# Patient Record
Sex: Female | Born: 1985 | Race: White | Hispanic: No | Marital: Single | Smoking: Never smoker
Health system: Southern US, Community
[De-identification: ages and names within clinical notes are randomized; demographics above are authoritative.]

## PROBLEM LIST (undated history)

## (undated) DIAGNOSIS — J45909 Unspecified asthma, uncomplicated: Secondary | ICD-10-CM

## (undated) DIAGNOSIS — F79 Unspecified intellectual disabilities: Secondary | ICD-10-CM

## (undated) DIAGNOSIS — G809 Cerebral palsy, unspecified: Secondary | ICD-10-CM

## (undated) DIAGNOSIS — M779 Enthesopathy, unspecified: Secondary | ICD-10-CM

## (undated) DIAGNOSIS — Z66 Do not resuscitate: Secondary | ICD-10-CM

## (undated) DIAGNOSIS — Z789 Other specified health status: Secondary | ICD-10-CM

## (undated) DIAGNOSIS — N390 Urinary tract infection, site not specified: Secondary | ICD-10-CM

## (undated) DIAGNOSIS — G40909 Epilepsy, unspecified, not intractable, without status epilepticus: Secondary | ICD-10-CM

## (undated) DIAGNOSIS — S7290XA Unspecified fracture of unspecified femur, initial encounter for closed fracture: Secondary | ICD-10-CM

## (undated) DIAGNOSIS — G919 Hydrocephalus, unspecified: Secondary | ICD-10-CM

## (undated) DIAGNOSIS — M419 Scoliosis, unspecified: Secondary | ICD-10-CM

## (undated) DIAGNOSIS — M8580 Other specified disorders of bone density and structure, unspecified site: Secondary | ICD-10-CM

## (undated) HISTORY — PX: TYMPANOSTOMY TUBE PLACEMENT: SHX32

## (undated) HISTORY — DX: Scoliosis, unspecified: M41.9

## (undated) HISTORY — DX: Unspecified fracture of unspecified femur, initial encounter for closed fracture: S72.90XA

## (undated) HISTORY — DX: Other specified health status: Z78.9

## (undated) HISTORY — PX: GASTROSTOMY TUBE PLACEMENT: SHX655

## (undated) HISTORY — DX: Do not resuscitate: Z66

## (undated) HISTORY — DX: Other specified disorders of bone density and structure, unspecified site: M85.80

## (undated) HISTORY — DX: Cerebral palsy, unspecified: G80.9

## (undated) HISTORY — PX: LAPAROSCOPIC NISSEN FUNDOPLICATION: SHX1932

## (undated) HISTORY — DX: Epilepsy, unspecified, not intractable, without status epilepticus: G40.909

## (undated) HISTORY — DX: Enthesopathy, unspecified: M77.9

## (undated) HISTORY — DX: Unspecified intellectual disabilities: F79

## (undated) HISTORY — DX: Unspecified asthma, uncomplicated: J45.909

## (undated) HISTORY — DX: Urinary tract infection, site not specified: N39.0

## (undated) HISTORY — PX: ILEOPSOAS RELEASE: SHX1782

## (undated) HISTORY — PX: OTHER SURGICAL HISTORY: SHX169

## (undated) HISTORY — DX: Hydrocephalus, unspecified: G91.9

---
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
## 2007-10-27 ENCOUNTER — Ambulatory Visit (HOSPITAL_COMMUNITY): Admission: RE | Admit: 2007-10-27 | Discharge: 2007-10-27 | Payer: Self-pay | Admitting: Family Medicine

## 2008-05-05 ENCOUNTER — Inpatient Hospital Stay (HOSPITAL_COMMUNITY): Admission: EM | Admit: 2008-05-05 | Discharge: 2008-05-11 | Payer: Self-pay | Admitting: Emergency Medicine

## 2008-05-10 ENCOUNTER — Ambulatory Visit: Payer: Self-pay | Admitting: Internal Medicine

## 2008-12-13 ENCOUNTER — Inpatient Hospital Stay (HOSPITAL_COMMUNITY): Admission: AD | Admit: 2008-12-13 | Discharge: 2008-12-18 | Payer: Self-pay | Admitting: Pulmonary Disease

## 2008-12-13 ENCOUNTER — Ambulatory Visit: Payer: Self-pay | Admitting: Internal Medicine

## 2008-12-22 ENCOUNTER — Encounter: Payer: Self-pay | Admitting: Pulmonary Disease

## 2009-02-22 ENCOUNTER — Encounter: Payer: Self-pay | Admitting: Pulmonary Disease

## 2009-04-30 ENCOUNTER — Encounter: Payer: Self-pay | Admitting: Pulmonary Disease

## 2009-06-06 ENCOUNTER — Encounter: Payer: Self-pay | Admitting: Pulmonary Disease

## 2009-06-15 ENCOUNTER — Ambulatory Visit (HOSPITAL_COMMUNITY): Admission: RE | Admit: 2009-06-15 | Discharge: 2009-06-15 | Payer: Self-pay | Admitting: Pediatrics

## 2009-09-28 ENCOUNTER — Encounter: Payer: Self-pay | Admitting: Pulmonary Disease

## 2009-10-14 DIAGNOSIS — G40909 Epilepsy, unspecified, not intractable, without status epilepticus: Secondary | ICD-10-CM | POA: Insufficient documentation

## 2009-10-14 DIAGNOSIS — J4530 Mild persistent asthma, uncomplicated: Secondary | ICD-10-CM

## 2009-10-14 DIAGNOSIS — J189 Pneumonia, unspecified organism: Secondary | ICD-10-CM

## 2009-10-14 DIAGNOSIS — G809 Cerebral palsy, unspecified: Secondary | ICD-10-CM

## 2009-10-17 ENCOUNTER — Telehealth (INDEPENDENT_AMBULATORY_CARE_PROVIDER_SITE_OTHER): Payer: Self-pay | Admitting: *Deleted

## 2009-10-17 ENCOUNTER — Ambulatory Visit: Payer: Self-pay | Admitting: Pulmonary Disease

## 2009-10-17 DIAGNOSIS — R0902 Hypoxemia: Secondary | ICD-10-CM

## 2009-10-17 DIAGNOSIS — R0602 Shortness of breath: Secondary | ICD-10-CM | POA: Insufficient documentation

## 2009-10-21 ENCOUNTER — Telehealth (INDEPENDENT_AMBULATORY_CARE_PROVIDER_SITE_OTHER): Payer: Self-pay | Admitting: *Deleted

## 2009-11-21 ENCOUNTER — Ambulatory Visit: Payer: Self-pay | Admitting: Pulmonary Disease

## 2009-12-22 ENCOUNTER — Telehealth (INDEPENDENT_AMBULATORY_CARE_PROVIDER_SITE_OTHER): Payer: Self-pay | Admitting: *Deleted

## 2010-01-25 ENCOUNTER — Ambulatory Visit: Payer: Self-pay | Admitting: Pulmonary Disease

## 2010-05-26 ENCOUNTER — Encounter: Admission: RE | Admit: 2010-05-26 | Discharge: 2010-05-26 | Payer: Self-pay | Admitting: Pediatrics

## 2010-07-29 ENCOUNTER — Inpatient Hospital Stay (HOSPITAL_COMMUNITY): Admission: EM | Admit: 2010-07-29 | Discharge: 2010-08-01 | Payer: Self-pay | Source: Home / Self Care

## 2010-08-02 ENCOUNTER — Inpatient Hospital Stay (HOSPITAL_COMMUNITY)
Admission: EM | Admit: 2010-08-02 | Discharge: 2010-08-10 | Payer: Self-pay | Source: Home / Self Care | Attending: Internal Medicine | Admitting: Internal Medicine

## 2010-08-07 ENCOUNTER — Telehealth: Payer: Self-pay | Admitting: Pulmonary Disease

## 2010-08-07 LAB — CULTURE, BLOOD (ROUTINE X 2)
Culture: NO GROWTH
Culture: NO GROWTH

## 2010-08-07 LAB — URINE CULTURE
Colony Count: NO GROWTH
Culture  Setup Time: 201201082212
Culture: NO GROWTH

## 2010-08-07 LAB — BASIC METABOLIC PANEL
BUN: 4 mg/dL — ABNORMAL LOW (ref 6–23)
BUN: 4 mg/dL — ABNORMAL LOW (ref 6–23)
BUN: 6 mg/dL (ref 6–23)
BUN: 2 mg/dL — ABNORMAL LOW (ref 6–23)
BUN: <1 mg/dL — ABNORMAL LOW (ref 6–23)
BUN: <1 mg/dL — ABNORMAL LOW (ref 6–23)
CO2: 25 mEq/L (ref 19–32)
CO2: 27 mEq/L (ref 19–32)
CO2: 20 mEq/L (ref 19–32)
CO2: 25 mEq/L (ref 19–32)
CO2: 27 mEq/L (ref 19–32)
CO2: 27 mEq/L (ref 19–32)
Calcium: 8.3 mg/dL — ABNORMAL LOW (ref 8.4–10.5)
Calcium: 8.7 mg/dL (ref 8.4–10.5)
Calcium: 8.5 mg/dL (ref 8.4–10.5)
Calcium: 7.8 mg/dL — ABNORMAL LOW (ref 8.4–10.5)
Calcium: 8.4 mg/dL (ref 8.4–10.5)
Calcium: 8.2 mg/dL — ABNORMAL LOW (ref 8.4–10.5)
Chloride: 101 mEq/L (ref 96–112)
Chloride: 102 mEq/L (ref 96–112)
Chloride: 105 mEq/L (ref 96–112)
Chloride: 104 mEq/L (ref 96–112)
Chloride: 102 mEq/L (ref 96–112)
Chloride: 106 mEq/L (ref 96–112)
Creatinine, Ser: <0.3 mg/dL — ABNORMAL LOW (ref 0.4–1.2)
Creatinine, Ser: 0.3 mg/dL — ABNORMAL LOW (ref 0.4–1.2)
Creatinine, Ser: 0.41 mg/dL (ref 0.4–1.2)
Creatinine, Ser: <0.3 mg/dL — ABNORMAL LOW (ref 0.4–1.2)
Creatinine, Ser: <0.3 mg/dL — ABNORMAL LOW (ref 0.4–1.2)
Creatinine, Ser: <0.3 mg/dL — ABNORMAL LOW (ref 0.4–1.2)
GFR calc Af Amer: >60 mL/min (ref >60)
GFR calc Af Amer: >60 mL/min (ref >60)
GFR calc non Af Amer: >60 mL/min (ref >60)
GFR calc non Af Amer: >60 mL/min (ref >60)
Glucose, Bld: 81 mg/dL (ref 70–99)
Glucose, Bld: 56 mg/dL — ABNORMAL LOW (ref 70–99)
Glucose, Bld: 42 mg/dL — CL (ref 70–99)
Glucose, Bld: 137 mg/dL — ABNORMAL HIGH (ref 70–99)
Glucose, Bld: 77 mg/dL (ref 70–99)
Glucose, Bld: 95 mg/dL (ref 70–99)
Potassium: 3.1 mEq/L — ABNORMAL LOW (ref 3.5–5.1)
Potassium: 4.4 mEq/L (ref 3.5–5.1)
Potassium: 3.7 mEq/L (ref 3.5–5.1)
Potassium: 2.6 mEq/L — CL (ref 3.5–5.1)
Potassium: 3.9 mEq/L (ref 3.5–5.1)
Potassium: 3.5 mEq/L (ref 3.5–5.1)
Sodium: 136 mEq/L (ref 135–145)
Sodium: 136 mEq/L (ref 135–145)
Sodium: 138 mEq/L (ref 135–145)
Sodium: 135 mEq/L (ref 135–145)
Sodium: 136 mEq/L (ref 135–145)
Sodium: 138 mEq/L (ref 135–145)

## 2010-08-07 LAB — CBC
HCT: 40.3 % (ref 36.0–46.0)
HCT: 37.6 % (ref 36.0–46.0)
HCT: 36.4 % (ref 36.0–46.0)
HCT: 41.5 % (ref 36.0–46.0)
HCT: 39 % (ref 36.0–46.0)
HCT: 37.1 % (ref 36.0–46.0)
Hemoglobin: 14 g/dL (ref 12.0–15.0)
Hemoglobin: 13.3 g/dL (ref 12.0–15.0)
Hemoglobin: 12.6 g/dL (ref 12.0–15.0)
Hemoglobin: 14.1 g/dL (ref 12.0–15.0)
Hemoglobin: 12.9 g/dL (ref 12.0–15.0)
Hemoglobin: 12.1 g/dL (ref 12.0–15.0)
MCH: 35.1 pg — ABNORMAL HIGH (ref 26.0–34.0)
MCH: 35.5 pg — ABNORMAL HIGH (ref 26.0–34.0)
MCH: 34.8 pg — ABNORMAL HIGH (ref 26.0–34.0)
MCH: 34.8 pg — ABNORMAL HIGH (ref 26.0–34.0)
MCH: 34.1 pg — ABNORMAL HIGH (ref 26.0–34.0)
MCH: 34 pg (ref 26.0–34.0)
MCHC: 34.7 g/dL (ref 30.0–36.0)
MCHC: 35.4 g/dL (ref 30.0–36.0)
MCHC: 34.6 g/dL (ref 30.0–36.0)
MCHC: 34 g/dL (ref 30.0–36.0)
MCHC: 33.1 g/dL (ref 30.0–36.0)
MCHC: 32.6 g/dL (ref 30.0–36.0)
MCV: 101 fL — ABNORMAL HIGH (ref 78.0–100.0)
MCV: 100.3 fL — ABNORMAL HIGH (ref 78.0–100.0)
MCV: 100.6 fL — ABNORMAL HIGH (ref 78.0–100.0)
MCV: 102.5 fL — ABNORMAL HIGH (ref 78.0–100.0)
MCV: 103.2 fL — ABNORMAL HIGH (ref 78.0–100.0)
MCV: 104.2 fL — ABNORMAL HIGH (ref 78.0–100.0)
Platelets: 188 10*3/uL (ref 150–400)
Platelets: 181 10*3/uL (ref 150–400)
Platelets: 206 10*3/uL (ref 150–400)
Platelets: 260 10*3/uL (ref 150–400)
Platelets: 285 10*3/uL (ref 150–400)
Platelets: 291 10*3/uL (ref 150–400)
RBC: 3.99 MIL/uL (ref 3.87–5.11)
RBC: 3.75 MIL/uL — ABNORMAL LOW (ref 3.87–5.11)
RBC: 3.62 MIL/uL — ABNORMAL LOW (ref 3.87–5.11)
RBC: 4.05 MIL/uL (ref 3.87–5.11)
RBC: 3.78 MIL/uL — ABNORMAL LOW (ref 3.87–5.11)
RBC: 3.56 MIL/uL — ABNORMAL LOW (ref 3.87–5.11)
RDW: 13.3 % (ref 11.5–15.5)
RDW: 13.1 % (ref 11.5–15.5)
RDW: 13.3 % (ref 11.5–15.5)
RDW: 13.3 % (ref 11.5–15.5)
RDW: 13.3 % (ref 11.5–15.5)
RDW: 13.3 % (ref 11.5–15.5)
WBC: 10.3 10*3/uL (ref 4.0–10.5)
WBC: 8.9 10*3/uL (ref 4.0–10.5)
WBC: 5.6 10*3/uL (ref 4.0–10.5)
WBC: 9.2 10*3/uL (ref 4.0–10.5)
WBC: 6.6 10*3/uL (ref 4.0–10.5)
WBC: 5.6 10*3/uL (ref 4.0–10.5)

## 2010-08-07 LAB — DIFFERENTIAL
Basophils Absolute: 0 10*3/uL (ref 0.0–0.1)
Basophils Absolute: 0 10*3/uL (ref 0.0–0.1)
Basophils Absolute: 0 10*3/uL (ref 0.0–0.1)
Basophils Absolute: 0 10*3/uL (ref 0.0–0.1)
Basophils Absolute: 0 10*3/uL (ref 0.0–0.1)
Basophils Relative: 0 % (ref 0–1)
Basophils Relative: 0 % (ref 0–1)
Basophils Relative: 0 % (ref 0–1)
Basophils Relative: 0 % (ref 0–1)
Basophils Relative: 1 % (ref 0–1)
Eosinophils Absolute: 0.2 10*3/uL (ref 0.0–0.7)
Eosinophils Absolute: 0.1 10*3/uL (ref 0.0–0.7)
Eosinophils Absolute: 0.4 10*3/uL (ref 0.0–0.7)
Eosinophils Absolute: 0.8 10*3/uL — ABNORMAL HIGH (ref 0.0–0.7)
Eosinophils Absolute: 0.6 10*3/uL (ref 0.0–0.7)
Eosinophils Relative: 2 % (ref 0–5)
Eosinophils Relative: 1 % (ref 0–5)
Eosinophils Relative: 8 % — ABNORMAL HIGH (ref 0–5)
Eosinophils Relative: 9 % — ABNORMAL HIGH (ref 0–5)
Eosinophils Relative: 9 % — ABNORMAL HIGH (ref 0–5)
Lymphocytes Relative: 8 % — ABNORMAL LOW (ref 12–46)
Lymphocytes Relative: 9 % — ABNORMAL LOW (ref 12–46)
Lymphocytes Relative: 22 % (ref 12–46)
Lymphocytes Relative: 13 % (ref 12–46)
Lymphocytes Relative: 22 % (ref 12–46)
Lymphs Abs: 0.8 10*3/uL (ref 0.7–4.0)
Lymphs Abs: 0.8 10*3/uL (ref 0.7–4.0)
Lymphs Abs: 1.2 10*3/uL (ref 0.7–4.0)
Lymphs Abs: 1.2 10*3/uL (ref 0.7–4.0)
Lymphs Abs: 1.4 10*3/uL (ref 0.7–4.0)
Monocytes Absolute: 0.7 10*3/uL (ref 0.1–1.0)
Monocytes Absolute: 0.6 10*3/uL (ref 0.1–1.0)
Monocytes Absolute: 0.6 10*3/uL (ref 0.1–1.0)
Monocytes Absolute: 0.4 10*3/uL (ref 0.1–1.0)
Monocytes Absolute: 0.5 10*3/uL (ref 0.1–1.0)
Monocytes Relative: 7 % (ref 3–12)
Monocytes Relative: 7 % (ref 3–12)
Monocytes Relative: 10 % (ref 3–12)
Monocytes Relative: 4 % (ref 3–12)
Monocytes Relative: 7 % (ref 3–12)
Neutro Abs: 8.5 10*3/uL — ABNORMAL HIGH (ref 1.7–7.7)
Neutro Abs: 7.4 10*3/uL (ref 1.7–7.7)
Neutro Abs: 3.4 10*3/uL (ref 1.7–7.7)
Neutro Abs: 6.7 10*3/uL (ref 1.7–7.7)
Neutro Abs: 4.1 10*3/uL (ref 1.7–7.7)
Neutrophils Relative %: 83 % — ABNORMAL HIGH (ref 43–77)
Neutrophils Relative %: 83 % — ABNORMAL HIGH (ref 43–77)
Neutrophils Relative %: 60 % (ref 43–77)
Neutrophils Relative %: 74 % (ref 43–77)
Neutrophils Relative %: 62 % (ref 43–77)

## 2010-08-07 LAB — URINALYSIS, ROUTINE W REFLEX MICROSCOPIC
Bilirubin Urine: NEGATIVE
Bilirubin Urine: NEGATIVE
Bilirubin Urine: NEGATIVE
Hgb urine dipstick: NEGATIVE
Hgb urine dipstick: NEGATIVE
Ketones, ur: NEGATIVE mg/dL
Ketones, ur: NEGATIVE mg/dL
Ketones, ur: NEGATIVE mg/dL
Nitrite: NEGATIVE
Nitrite: NEGATIVE
Nitrite: NEGATIVE
Protein, ur: NEGATIVE mg/dL
Protein, ur: NEGATIVE mg/dL
Protein, ur: NEGATIVE mg/dL
Specific Gravity, Urine: 1.01 (ref 1.005–1.030)
Specific Gravity, Urine: 1.005 (ref 1.005–1.030)
Specific Gravity, Urine: 1.011 (ref 1.005–1.030)
Urine Glucose, Fasting: NEGATIVE mg/dL
Urine Glucose, Fasting: NEGATIVE mg/dL
Urine Glucose, Fasting: NEGATIVE mg/dL
Urobilinogen, UA: 0.2 mg/dL (ref 0.0–1.0)
Urobilinogen, UA: 0.2 mg/dL (ref 0.0–1.0)
Urobilinogen, UA: 0.2 mg/dL (ref 0.0–1.0)
pH: 5.5 (ref 5.0–8.0)
pH: 6.5 (ref 5.0–8.0)
pH: 7.5 (ref 5.0–8.0)

## 2010-08-07 LAB — COMPREHENSIVE METABOLIC PANEL
ALT: 35 U/L (ref 0–35)
ALT: 29 U/L (ref 0–35)
ALT: 30 U/L (ref 0–35)
AST: 33 U/L (ref 0–37)
AST: 30 U/L (ref 0–37)
AST: 36 U/L (ref 0–37)
Albumin: 3.6 g/dL (ref 3.5–5.2)
Albumin: 3.2 g/dL — ABNORMAL LOW (ref 3.5–5.2)
Albumin: 3.7 g/dL (ref 3.5–5.2)
Alkaline Phosphatase: 113 U/L (ref 39–117)
Alkaline Phosphatase: 97 U/L (ref 39–117)
Alkaline Phosphatase: 99 U/L (ref 39–117)
BUN: 5 mg/dL — ABNORMAL LOW (ref 6–23)
BUN: 2 mg/dL — ABNORMAL LOW (ref 6–23)
BUN: <1 mg/dL — ABNORMAL LOW (ref 6–23)
CO2: 30 mEq/L (ref 19–32)
CO2: 29 mEq/L (ref 19–32)
CO2: 26 mEq/L (ref 19–32)
Calcium: 8.4 mg/dL (ref 8.4–10.5)
Calcium: 8 mg/dL — ABNORMAL LOW (ref 8.4–10.5)
Calcium: 9 mg/dL (ref 8.4–10.5)
Chloride: 95 mEq/L — ABNORMAL LOW (ref 96–112)
Chloride: 99 mEq/L (ref 96–112)
Chloride: 100 mEq/L (ref 96–112)
Creatinine, Ser: <0.3 mg/dL — ABNORMAL LOW (ref 0.4–1.2)
Creatinine, Ser: 0.25 mg/dL — ABNORMAL LOW (ref 0.4–1.2)
Creatinine, Ser: <0.3 mg/dL — ABNORMAL LOW (ref 0.4–1.2)
GFR calc Af Amer: >60 mL/min (ref >60)
GFR calc non Af Amer: >60 mL/min (ref >60)
Glucose, Bld: 101 mg/dL — ABNORMAL HIGH (ref 70–99)
Glucose, Bld: 100 mg/dL — ABNORMAL HIGH (ref 70–99)
Glucose, Bld: 83 mg/dL (ref 70–99)
Potassium: 2.8 mEq/L — ABNORMAL LOW (ref 3.5–5.1)
Potassium: 3.5 mEq/L (ref 3.5–5.1)
Potassium: 4.2 mEq/L (ref 3.5–5.1)
Sodium: 134 mEq/L — ABNORMAL LOW (ref 135–145)
Sodium: 136 mEq/L (ref 135–145)
Sodium: 136 mEq/L (ref 135–145)
Total Bilirubin: 0.3 mg/dL (ref 0.3–1.2)
Total Bilirubin: 0.4 mg/dL (ref 0.3–1.2)
Total Bilirubin: 0.4 mg/dL (ref 0.3–1.2)
Total Protein: 6.5 g/dL (ref 6.0–8.3)
Total Protein: 6.1 g/dL (ref 6.0–8.3)
Total Protein: 6.8 g/dL (ref 6.0–8.3)

## 2010-08-07 LAB — PHENYTOIN LEVEL, TOTAL: Phenytoin Lvl: 2.6 ug/mL — ABNORMAL LOW (ref 10.0–20.0)

## 2010-08-07 LAB — GLUCOSE, CAPILLARY
Glucose-Capillary: 302 mg/dL — ABNORMAL HIGH (ref 70–99)
Glucose-Capillary: 43 mg/dL — CL (ref 70–99)
Glucose-Capillary: 79 mg/dL (ref 70–99)
Glucose-Capillary: 101 mg/dL — ABNORMAL HIGH (ref 70–99)
Glucose-Capillary: 127 mg/dL — ABNORMAL HIGH (ref 70–99)
Glucose-Capillary: 98 mg/dL (ref 70–99)
Glucose-Capillary: 124 mg/dL — ABNORMAL HIGH (ref 70–99)
Glucose-Capillary: 108 mg/dL — ABNORMAL HIGH (ref 70–99)

## 2010-08-07 LAB — BRAIN NATRIURETIC PEPTIDE: Pro B Natriuretic peptide (BNP): <30 pg/mL (ref 0.0–100.0)

## 2010-08-07 LAB — LACTIC ACID, PLASMA: Lactic Acid, Venous: 1.3 mmol/L (ref 0.5–2.2)

## 2010-08-07 LAB — LIPASE, BLOOD
Lipase: 18 U/L (ref 11–59)
Lipase: 15 U/L (ref 11–59)

## 2010-08-07 LAB — OCCULT BLOOD, POC DEVICE: Fecal Occult Bld: NEGATIVE

## 2010-08-07 LAB — PHOSPHORUS: Phosphorus: 3.7 mg/dL (ref 2.3–4.6)

## 2010-08-07 LAB — MAGNESIUM
Magnesium: 1.8 mg/dL (ref 1.5–2.5)
Magnesium: 2.2 mg/dL (ref 1.5–2.5)
Magnesium: 2.2 mg/dL (ref 1.5–2.5)

## 2010-08-07 LAB — URINE MICROSCOPIC-ADD ON

## 2010-08-07 LAB — CLOSTRIDIUM DIFFICILE BY PCR: Toxigenic C. Difficile by PCR: NEGATIVE

## 2010-08-09 LAB — BASIC METABOLIC PANEL
BUN: <1 mg/dL — ABNORMAL LOW (ref 6–23)
BUN: 4 mg/dL — ABNORMAL LOW (ref 6–23)
CO2: 29 mEq/L (ref 19–32)
CO2: 30 mEq/L (ref 19–32)
Calcium: 8.4 mg/dL (ref 8.4–10.5)
Calcium: 8.3 mg/dL — ABNORMAL LOW (ref 8.4–10.5)
Chloride: 103 mEq/L (ref 96–112)
Chloride: 99 mEq/L (ref 96–112)
Creatinine, Ser: <0.3 mg/dL — ABNORMAL LOW (ref 0.4–1.2)
Creatinine, Ser: <0.3 mg/dL — ABNORMAL LOW (ref 0.4–1.2)
Glucose, Bld: 93 mg/dL (ref 70–99)
Glucose, Bld: 101 mg/dL — ABNORMAL HIGH (ref 70–99)
Potassium: 3.6 mEq/L (ref 3.5–5.1)
Potassium: 2.9 mEq/L — ABNORMAL LOW (ref 3.5–5.1)
Sodium: 136 mEq/L (ref 135–145)
Sodium: 138 mEq/L (ref 135–145)

## 2010-08-09 LAB — URINE CULTURE
Colony Count: NO GROWTH
Culture  Setup Time: 201201152108
Culture: NO GROWTH

## 2010-08-09 LAB — GLUCOSE, CAPILLARY
Glucose-Capillary: 78 mg/dL (ref 70–99)
Glucose-Capillary: 79 mg/dL (ref 70–99)
Glucose-Capillary: 81 mg/dL (ref 70–99)
Glucose-Capillary: 83 mg/dL (ref 70–99)
Glucose-Capillary: 93 mg/dL (ref 70–99)
Glucose-Capillary: 99 mg/dL (ref 70–99)
Glucose-Capillary: 143 mg/dL — ABNORMAL HIGH (ref 70–99)
Glucose-Capillary: 160 mg/dL — ABNORMAL HIGH (ref 70–99)
Glucose-Capillary: 165 mg/dL — ABNORMAL HIGH (ref 70–99)
Glucose-Capillary: 171 mg/dL — ABNORMAL HIGH (ref 70–99)
Glucose-Capillary: 181 mg/dL — ABNORMAL HIGH (ref 70–99)

## 2010-08-13 ENCOUNTER — Encounter: Payer: Self-pay | Admitting: Pediatrics

## 2010-08-14 ENCOUNTER — Ambulatory Visit
Admission: RE | Admit: 2010-08-14 | Discharge: 2010-08-14 | Payer: Self-pay | Source: Home / Self Care | Attending: Pulmonary Disease | Admitting: Pulmonary Disease

## 2010-08-14 LAB — BASIC METABOLIC PANEL
BUN: 8 mg/dL (ref 6–23)
CO2: 29 mEq/L (ref 19–32)
CO2: 30 mEq/L (ref 19–32)
Calcium: 9 mg/dL (ref 8.4–10.5)
Chloride: 104 mEq/L (ref 96–112)
Creatinine, Ser: <0.3 mg/dL — ABNORMAL LOW (ref 0.4–1.2)
Glucose, Bld: 86 mg/dL (ref 70–99)
Glucose, Bld: 83 mg/dL (ref 70–99)
Potassium: 4.2 mEq/L (ref 3.5–5.1)
Sodium: 138 mEq/L (ref 135–145)
Sodium: 140 mEq/L (ref 135–145)

## 2010-08-14 LAB — GLUCOSE, CAPILLARY
Glucose-Capillary: 75 mg/dL (ref 70–99)
Glucose-Capillary: 90 mg/dL (ref 70–99)
Glucose-Capillary: 130 mg/dL — ABNORMAL HIGH (ref 70–99)
Glucose-Capillary: 119 mg/dL — ABNORMAL HIGH (ref 70–99)
Glucose-Capillary: 153 mg/dL — ABNORMAL HIGH (ref 70–99)

## 2010-08-14 LAB — CBC
HCT: 35.1 % — ABNORMAL LOW (ref 36.0–46.0)
Hemoglobin: 11.3 g/dL — ABNORMAL LOW (ref 12.0–15.0)
Hemoglobin: 11.8 g/dL — ABNORMAL LOW (ref 12.0–15.0)
MCH: 33.5 pg (ref 26.0–34.0)
MCHC: 32.2 g/dL (ref 30.0–36.0)
MCV: 103.8 fL — ABNORMAL HIGH (ref 78.0–100.0)
WBC: 8.6 10*3/uL (ref 4.0–10.5)

## 2010-08-14 NOTE — Discharge Summary (Addendum)
  NAMEAURIA, Renee [name]                  ACCOUNT NO.:  [id number]  MEDICAL RECORD NO.:  [id number]          PATIENT TYPE:  INP  LOCATION:  3009                         FACILITY:  [facility]  PHYSICIAN:  Tarry Kos, MD       DATE OF BIRTH:  1986/06/02  DATE OF ADMISSION:  08/02/2010 DATE OF DISCHARGE:  08/10/2010                              DISCHARGE SUMMARY   DISCHARGE DIAGNOSES: 1. Ogilvie syndrome. 2. Hypokalemia. 3. Cerebral palsy. 4. History of chronic constipation. 5. Aspiration pneumonia. 6. History of seizure disorder. 7. Severely debilitating state. 8. History of hydrocephalus.  HOSPITAL COURSE:  Ms. Urich is a 25 year old female with severe disability secondary to cerebral palsy who presented to the emergency room with abdominal pain and unable to tolerate her tube feeds on August 03, 2010.  Please see admit H and P for full details and also interim discharge summary on August 08, 2010.  Ever since August 08, 2010, her tube feeds have been reinstituted and she has been tolerating dose for several days now.  Her potassium levels have been replaced. Her pseudo-obstruction has resolved.  She has been treated for aspiration pneumonia.  I am going to discharge her to complete a course of clindamycin.  Her white count is normal.  Hemoglobin is stable at 11.8.  Her potassium level today is 4.3.  Yesterday, it was 4.2.  Her blood cultures have been negative.  Urine cultures have also been negative.  PHYSICAL EXAMINATION:  VITAL SIGNS:  She has been afebrile.  Her vital signs have been stable. GENERAL:  She has been arousable with voice, no apparent distress, chronically debilitated state with severe mental retardation. CARDIAC:  Regular rate and rhythm without murmurs, rubs, or gallops. CHEST:  Clear to auscultation bilaterally.  No wheezes, rhonchi, or rales. ABDOMEN:  Soft, nontender, nondistended.  Positive bowel sounds.  No hepatosplenomegaly. EXTREMITIES:  No clubbing  or cyanosis.  She has got contracted extremities. SKIN:  No rashes. PSYCH:  No significant agitation.  She is being discharged home in the care of her mother.  She has got 24- hour care also through her insurance and home health arranged at home. At her followup appointment, I would recommend repeating a potassium level and magnesium level.  DISCHARGE MEDICATIONS: 1. MiraLax 17 g b.i.d. 2. Clindamycin 300 mg 3 times a day per tube for the next 7 days. 3. Jevity tube feeds as prior to admission. 4. Acetylcysteine 3 mL q.4 h. as needed. 5. Albuterol nebulizer 4 hours as needed for shortness of breath. 6. Children's ibuprofen as needed. 7. Primidone 50 mg 4 tablets at 7 a.m. and 11 p.m. and 3 tablets at 3     p.m. 8. Baclofen 10 mg one-half tablets at 7 a.m. and 3 p.m. and 2 tablets     at 11 p.m.          ______________________________ Tarry Kos, MD     RD/MEDQ  D:  08/10/2010  T:  08/11/2010  Job:  [id number]  Electronically Signed by Eldridge Dace MD on 08/14/2010 01:56:56 PM

## 2010-08-15 LAB — CULTURE, BLOOD (ROUTINE X 2)
Culture  Setup Time: 201201160854
Culture: NO GROWTH

## 2010-08-17 NOTE — Progress Notes (Addendum)
Renee [name], Renee [name]                  ACCOUNT NO.:  [id number]  MEDICAL RECORD NO.:  [id number]          PATIENT TYPE:  INP  LOCATION:  3009                         FACILITY:  [facility]  PHYSICIAN:  Kela Millin, M.D.DATE OF BIRTH:  03-23-1986                                PROGRESS NOTE   CURRENT DIAGNOSES: 1. Pseudo-obstruction/probable Ogilvie's /ileus, improved. 2. Chronic constipation. 3. Hypokalemia. 4. Probable aspiration pneumonia. 5. Cerebral palsy with spasticity 6. Seizure disorder. 7. History of hydrocephalus. 8. Contractures of the lower extremities. 9. Status post recent discharge from [facility] for treatment     of pneumonia.  PROCEDURES AND STUDIES: 1. Chest x-ray on January 11:  Further improvement in bilateral     perihilar opacities.  Minimal persistent opacities. 2. CT scan of abdomen and pelvis on January 11, technically suboptimal     study due to severe scoliosis and artifact from posterior spinal     fixation rods.  Diffuse colonic ileus versus pseudo-obstruction.     Dilated urinary bladder with multiple small bladder calculi.  Small     bowel gas in the urinary bladder may be due to recent     catheterization.  Central left lower lobe infiltrates, pneumonia     cannot be ruled out.  Small right pleural effusion. 3. Followup chest x-ray on 01/14:  Resolution of bilateral perihilar     pneumonia.  No acute cardiopulmonary disease. Abdominal x-rays on 01/15: Feeding tube in place in the gastric body, several gas-filled loops of large and small bowel suggesting ileus. 1. Chest x-ray on 01/16:  Worsening aeration.  Increasing bilateral     pulmonary opacities. 2. Followup chest x-ray on 01/17:  Patchy pulmonary infiltrative     densities are present within the right mid and lower lung areas.     There is slight increase in the density and extent of infiltrates     compared to previous study.  There is slight haziness in the lower     left mid  lung area without consolidation.  There is a small right     pleural effusion which appears to increase compared to previous     study.  Other findings are chronic and stable.  CONSULTATIONS: 1. [facility], Dr. Donell Beers. 2. Gastroenterology, Dr. Marina Goodell.  BRIEF HISTORY: The patient is a 25 year old white female with the above-listed medical problems who presented with abdominal pain.  The history was obtained from her mother at the bedside.  It was noted that the patient had just been discharged January 10 from [facility] following hospitalization for pneumonia.  Her mother reported that she was doing okay until the evening of admission when she received tube feeding via the G-tube.  Her abdomen became distended and her mother reported that she sat up.  Her mother also stated that the patient had mild abdominal distention upon discharge but she was instructed to monitor it and follow up with her primary care physician if the patient had any signs of discomfort.  Her mother reported that on the morning of admission abdomen was more distended and the patient was  uncomfortable to palpation.  It was reported that the patient had one diarrheal stool and it was described as brown and watery.  She was seen in the ED and a CT scan of the abdomen was done which showed colonic ileus versus pseudoobstruction.  She was admitted for further evaluation and management.  HOSPITAL COURSE:  PROBLEM #1: Colonic pseudo-obstruction/ileus. Upon admission the patient was kept n.p.o.  No feedings through the PEG tube.  She was hydrated with IV fluids.  General Surgery was consulted and they followed the patient. The impression was that she had a chronic ileus versus pseudoobstruction which would be common in someone with recent medical illness on top of her chronic illness especially neurogenic one.  Dr. Donell Beers recommended to minimize narcotics and muscle relaxants.  He baclofen was  held initially.  Her G-tube was placed to gravity.  The patient was having bowel movements and the abdominal distention improved.  Surgery followed and signed off.  The patient was started on Pedialyte and she tolerated this well and Jevity tube feedings were reinitiated. But following this, she had increased residuals and so the tube feedings were again stopped. Abdominal x-rays were done on 01/15 and the results are stated above showing gas field loops of large and small bowel suggesting ileus. General Surgery signed off and recommended GI consultation.  Dr. Audley Hose saw the patient initially and recommended an NG tube if the mother consented, but she did not consent to this.  GI followed and the patient was placed on Reglan and her bolus tube feedings were again restarted as well as MiraLAX.  The patient has been tolerating her tube feedings without residuals but she has not had any bowel movements for the second day now.  Her MiraLAX has been increased to b.i.d.  Follow and further manage accordingly. 1. Hypokalemia.  Her potassium was replaced in the hospital. 2. Probable aspiration pneumonia.  The patient had been treated for     pneumonia at [facility] just prior to admission here.  She     was maintained on Avelox during this hospital stay.  She has had     problems with clearing her secretions with episodes of shortness of     breath and coughing and chest x-ray done today is more consistent     with pneumonia, possibly recurrent aspiration pneumonia.     Clindamycin was added to Avelox today.  She has also been on     supplemental oxygen and nebulized bronchodilators as well as     suctioning as needed. 3. Seizure disorder.  The patient initially was placed on Dilantin     when she was n.p.o. Subsequently when she was able to tolerate tube     feedings.  She was started back on primidone and the Dilantin     discontinued. 4. Cerebral palsy with spasticity.  The patient's  baclofen was put on     hold secondary to number one, but the patient has been having     increased spasticity and so her baclofen was restarted at her     mother's request today. 5. The rounding hospitalist to follow and dictate the rest of this     hospital stay as well as her final discharge medications.     Kela Millin, M.D.     ACV/MEDQ  D:  08/08/2010  T:  08/08/2010  Job:  [id number]  Electronically Signed by Donnalee Curry M.D. on 08/17/2010 09:38:03 AM

## 2010-08-18 NOTE — Consult Note (Signed)
Renee [name], Renee [name]                  ACCOUNT NO.:  [id number]  MEDICAL RECORD NO.:  [id number]          PATIENT TYPE:  INP  LOCATION:  3009                         FACILITY:  [facility]  PHYSICIAN:  Jordan Hawks. Elnoria Howard, MD    DATE OF BIRTH:  [birth year]  DATE OF CONSULTATION:  08/06/2010 DATE OF DISCHARGE:                                CONSULTATION   PRIMARY CARE PROVIDER:  Corrie Mckusick, MD  This is unassigned triad hospitalist patient.  Consultation performed for [facility].  HISTORY OF PRESENT ILLNESS:  This is a 25 year old female with a past medical history of cerebral palsy and seizure disorder who is admitted to the hospital with complaints of abdominal pain and distention.  The patient was recently hospitalized for 3 days at [facility] for pneumonia.  At that time, the patient appeared to be in stable condition from the GI standpoint.  However, towards the end of the hospitalization, there were some reports by the mother that the patient was sitting up and there was some mild abdominal distention.  Typically, her abdomen is not distended and her iliac crest can be visualized. However, her symptoms continued to worsen and there is discomfort noted by the mother and she is subsequently admitted for further evaluation and treatment.  She was found and KUB revealed that she had a markedly distended gastric lumen and also some distention of her colonic intestine.  There was no evidence of any obstruction could be identified.  It is felt that the patient may have an ileus.  The patient is completely bed-bound as a result of her cerebral palsy and is not verbally communicative.  During the time that she was initially hospitalized, there were also reports that she had issues with diarrheal stool and this continued to persist.  However, she has not had any significant bowel movement for the past week, meaning no formed bowel movement.  She has had some watery-type stools and  today there was some evidence of mucus.  Surgery was consulted to evaluate the patient, however, there was no surgical indication.  Subsequently, GI consultation was requested.  PAST MEDICAL HISTORY AND PAST SURGICAL HISTORY:  As stated above with the addition of spinal surgery, rod implantation, bilateral hip dysplasia surgery, and ventriculoperitoneal shunt.  FAMILY HISTORY:  Noncontributory.  SOCIAL HISTORY:  The patient lives with her mother.  No alcohol, tobacco, or illicit drug use.  MEDICATIONS: 1. Albuterol 0.6 mg inhaler q.6 h. 2. Moxifloxacin 400 mg IV daily. 3. MiraLax 17 g p.o. daily. 4. Primidone 150 mg p.o. daily. 5. [name] 0.5 mg p.o. daily. 6. Tylenol 650 mg p.r.n. q.4 h. p.r.n. 7. Haldol 0.5 mg IV b.i.d. p.r.n. 8. Zofran 4 mg IV q.6 h. p.r.n.  ALLERGIES:  PENICILLIN.  PHYSICAL EXAMINATION:  VITAL SIGNS:  Blood pressure is 90/60.  Heart rate is 115.  Respirations are 16.  Temperature is 100.8. GENERAL:  The patient does not appear to be in acute distress, but I am truly unable to discern her actual baseline.  She has issues with some upper airway congestion.  She is having some difficulty  managing her oral secretions. HEENT:  Normocephalic and atraumatic.  Extraocular muscles appear to be intact. NECK:  Supple.  No lymphadenopathy. LUNGS:  Clear to auscultation bilaterally on the anterior examination. ABDOMEN:  Distended, soft, tympanic, some bowel sounds. EXTREMITIES:  Markedly atrophied and contractured.  LABORATORY VALUES:  Sodium is 138, potassium 3.5, chloride 106, CO2 of 27, glucose 95, BUN less than 1, and creatinine 0.3.  IMPRESSION: 1. Abdominal distention secondary to ileus. 2. Cerebral palsy. 3. Upper airway congestion and difficulty managing secretions.  At this time, I had hope that we would be able to express some air from the PEG tube.  Unfortunately, I was not able to manually decompress the abdomen.  Even with suction, only 60 mL  of some fluid and some liquids was able to be aspirated, but beyond that no further aspiration was possible.  She was found to have significant hypokalemia at 2.6 several days ago and this can be a compounding factor for her ileus.  There is no clear evidence of any obstruction at this time.  Unfortunately, the patient has very limited options.  Since an adapter cannot be placed on her current PEG tube for low intermittent suction, the next best alternative is an NG tube.  I discussed this in detail and mother has concern that this NG tube could exacerbate her congestion issues. However, options are extremely limited in this case.  She is not ready to make the decision to place an NG tube at this time and she would like more time to think about the next step  PLAN: 1. To place an NG tube if the mother consents. 2. It is okay to place to allow for small volume of medications     through the PEG tube.     Jordan Hawks Elnoria Howard, MD     PDH/MEDQ  D:  08/06/2010  T:  08/07/2010  Job:  [id number]  Electronically Signed by Jeani Hawking MD on 08/16/2010 07:18:13 AM

## 2010-08-22 NOTE — Assessment & Plan Note (Signed)
Summary: CEREBRAL PALSY W/ WORSENING PULM CONDITIONS/KP   Copy to:  [name] Primary Provider/Referring Provider:  Dr. [name]  CC:  Pulmonary consult. Patient with cerebral palsy and worsening sob and congestion...  History of Present Illness: 25 yo female for evaluation of dyspnea.  She was seen by pulmonary service when in the hospital for aspiration pneumonia in May 2010.  She continues to have trouble with her swallowing.  She has been getting a cough with congestion.  She gets bubbling of clear to pink sputum at times.  She does also wheeze.  She has been using albuterol two times a day, and acetylcysteine two times a day.  This has helped.  She has also been using delsym at night.  She has not been having fever.  She has a PEG, and does not eat anything by mouth.  Her mother has not noticed any problems with her teeth or her sinuses.  She has not had any recent travel history or sick exposures.  There are pets at home, but they stay outside.  The mother has stated that she would not want to have her undergo intubation or tracheostomy if her breathing were to get worse.  She has oxygen at home, but her mother only puts it on when she looks like she is having more trouble with her breathing.  Preventive Screening-Counseling & Management  Alcohol-Tobacco     Smoking Status: never  Current Medications (verified): 1)  Primidone 50 Mg Tabs (Primidone) .... 3 Three Times A Day 2)  Baclofen 20 Mg Tabs (Baclofen) .Marland Kitchen.. 1 1/2 Three Times A Day 3)  Albuterol Sulfate (2.5 Mg/32ml) 0.083% Nebu (Albuterol Sulfate) .... Two Times A Day 4)  Acetylcysteine 10 % Soln (Acetylcysteine) .... Two Times A Day 5)  Risperdal 0.5 Mg Tabs ([name]) .... Two Times A Day  Allergies (verified): 1)  ! Pcn 2)  ! * Adhesive Tape  Past History:  Past Medical History: Cerbral palsy Mental retardation Hydrocephalus s/p VP shunt Seizure disorder Osteopenia Aspiration pneumonia Bilateral  hip dysplasia DNR/DNI  Family History: Family History Breast Cancer---MGM Heart disease===MGF  Social History: Patient never smoked.  Lives with her mother, Capria Cartaya Status:  never  Review of Systems       The patient complains of shortness of breath with activity, shortness of breath at rest, productive cough, non-productive cough, difficulty swallowing, and joint stiffness or pain.  The patient denies coughing up blood, chest pain, irregular heartbeats, acid heartburn, indigestion, loss of appetite, weight change, abdominal pain, sore throat, tooth/dental problems, headaches, nasal congestion/difficulty breathing through nose, sneezing, itching, ear ache, anxiety, depression, hand/feet swelling, rash, change in color of mucus, and fever.    Vital Signs:  Patient profile:   25 year old female Height:      48 inches (121.92 cm) Weight:      48 pounds (21.82 kg) BMI:     14.70 O2 Sat:      87 % on Room air Temp:     97.0 degrees F (36.11 degrees C) axillary Pulse rate:   133 / minute BP sitting:   112 / 68  (right arm) Cuff size:   regular  Vitals Entered By: Michel Bickers CMA (October 17, 2009 1:59 PM)  O2 Sat at Rest %:  87 O2 Flow:  Room air  Physical Exam  General:  Thin. Nose:  narrow nasal passages, clear drainage, no tenderness Mouth:  MP 2, no exudate Neck:  no JVD.   Lungs:  b/l rhonchi, no wheezing, faint basilar rales Heart:  regular rhythm, normal rate, and no murmurs.   Abdomen:  thin, soft, PEG site clean Extremities:  contracted Neurologic:  Awake, no verbal, moans intermittently Cervical Nodes:  no significant adenopathy   Impression & Recommendations:  Problem # 1:  DYSPNEA (ICD-786.05) She has prior history of aspiration pneumonia with respiratory failure requiring intubation.  The mother was quite clear that she would not want to subject Iqra to intubation or tracheostomy.  The primary focus of care should be directed at keeping her breathing  comfortable.  Problem # 2:  HYPOXEMIA (ICD-799.02) Advised to use oxygen on a regular basis.  Will set them up for a portable pulse oximeter.  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  She has improved with nebulizer therapy.  I have advised her mother to give Josalin albuterol nebulized qid as needed, and to start budesonide two times a day.  Advised to use albuterol shortly before using budesonide.  She can use acetylcysteine as needed for cough or congestion.  Medications Added to Medication List This Visit: 1)  Budesonide 0.5 Mg/66ml Susp (Budesonide) .... One vial nebulized two times a day 2)  Albuterol Sulfate (2.5 Mg/84ml) 0.083% Nebu (Albuterol sulfate) .... Two times a day 3)  Acetylcysteine 10 % Soln (Acetylcysteine) .... Two times a day as needed 4)  Acetylcysteine 10 % Soln (Acetylcysteine) .... Two times a day 5)  Risperdal 0.5 Mg Tabs ([name]) .... Two times a day 6)  Do Not Resuscitate  7)  Flonase 50 Mcg/act Susp (Fluticasone propionate) .... Two sprays once daily 8)  Transderm-scop 1.5 Mg Pt72 (Scopolamine base) .... One patch topical q72hrs  Complete Medication List: 1)  Budesonide 0.5 Mg/19ml Susp (Budesonide) .... One vial nebulized two times a day 2)  Albuterol Sulfate (2.5 Mg/32ml) 0.083% Nebu (Albuterol sulfate) .... Two times a day 3)  Acetylcysteine 10 % Soln (Acetylcysteine) .... Two times a day as needed 4)  Primidone 50 Mg Tabs (Primidone) .... 3 three times a day 5)  Baclofen 20 Mg Tabs (Baclofen) .Marland Kitchen.. 1 1/2 three times a day 6)  Risperdal 0.5 Mg Tabs ([name]) .... Two times a day 7)  Do Not Resuscitate  8)  Flonase 50 Mcg/act Susp (Fluticasone propionate) .... Two sprays once daily 9)  Transderm-scop 1.5 Mg Pt72 (Scopolamine base) .... One patch topical q72hrs  Other Orders: Consultation Level IV (78938) DME Referral (DME)  Patient Instructions: 1)  Albuterol nebulized up to four times per day as needed    2)  Budesonide nebulized two times a day; use albuterol first 3)  Acetylcysteine nebulized two times a day as needed for increased congestion 4)  Will arrange for pulse oximeter 5)  Try to use oxygen more regular 6)  Follow up in 4 to 6 weeks

## 2010-08-22 NOTE — H&P (Addendum)
[name], [name]                  ACCOUNT NO.:  [id number]  MEDICAL RECORD NO.:  [id number]          PATIENT TYPE:  INP  LOCATION:  3009                         FACILITY:  [facility]  PHYSICIAN:  Vania Rea, M.D. DATE OF BIRTH:  1985-08-03  DATE OF ADMISSION:  08/02/2010 DATE OF DISCHARGE:                             HISTORY & PHYSICAL   PRIMARY CARE DOCTOR:  Corrie Mckusick, M.D.  CHIEF COMPLAINT:  Abdominal pain.  HISTORY OF PRESENT ILLNESS:  Renee [name] is a 25 year old female with a history of cerebral palsy with spasticity and seizure disorder who was brought to the [facility] ED via her primary care physician's office with a chief complaint of abdominal pain.  Information is obtained from the mother, who is at the bedside.  Mother reports that the patient was discharged just yesterday, August 01, 2010, after a 3-day hospitalization for pneumonia.  Mother reports that the patient was doing okay until yesterday evening when she received her evening tube feeding via the G-tube.  The patient's abdomen became distended and mother reports that the patient "spit up."  Mother does report that the patient had mild abdominal distention upon discharge and she was instructed to monitor and to follow up with her primary care provider should the distention worsen or that there was any signs of discomfort from the patient.  Mother indicates that this morning the abdomen was more distended and the patient was uncomfortable to palpation.  She also had one episode of diarrheal stool.  Mother describes the stool was brown, watery.  She denies any dark stool or blood in the stool.  Mother took the patient to the primary care provider who referred her to the ED.  Workup in the emergency room yields a CT of the abdomen concerning for colonic ileus versus pseudo-obstruction.  We are asked to admit for further evaluation and treatment.  ALLERGIES:  PENICILLIN.  PAST MEDICAL HISTORY: 1. Cerebral  palsy. 2. Spasticity. 3. Seizure disorder. 4. Contracture of the lower extremities. 5. Hydrocephalus. 6. Recent pneumonia.  PAST SURGICAL HISTORY: 1. Spinal surgery with rod implant. 2. Bilateral hip dysplasia with spinal hardware. 3. Ventriculoperitoneal shunt.  FAMILY MEDICAL HISTORY:  No known family history of colorectal carcinoma, liver, or chronic GI problems.  Mother is 26 and healthy.  SOCIAL HISTORY:  Renee [name] resides at home with her mother.  She is a nonsmoker, nondrinker.  She does not do any illicit drugs.  MEDICATIONS: 1. Risperdal 0.5 mg p.o. 1-2 tablets b.i.d. 2. Primidone 50 mg 3-4 tablets per G-tube t.i.d. 3. MiraLax 17 g per tube daily as needed for constipation. 4. Mucinex 20 mL daily as needed for congestion. 5. Children's ibuprofen 2 tablespoons per tube daily as needed for     pain. 6. Baclofen 10 mg 1.5-2 tablets t.i.d. per tube. 7. Avelox 400 mg 1 tablet daily per tube for seven more days. 8. Albuterol nebulization inhaled 1 vial every 4 hours as needed. 9. Acetylcysteine inhaled 3 mL every 4 hours as needed for shortness     of breath.  REVIEW OF SYSTEMS:  Unable to obtain as the patient  is nonverbal, but reviewed a 10-point system with mother and caregiver and all are negative except as dictated in the HPI.  LABORATORY DATA:  WBC 9.2, hemoglobin 14.1, hematocrit 41.5, platelets 260.  Sodium 136, potassium 4.2, chloride 100, CO2 26, BUN less than 1, creatinine less than 0.30, lactic acid 1.3, lipase 15. FOBT is negative. Urinalysis is negative.  C diff is negative.  RADIOLOGY:  Chest x-ray yields further improvement in bilateral perihilar opacities.  Minimal persistent opacities.  CT abdomen and pelvis yields diffuse colonic ileus versus pseudo- obstruction.  Dilated urinary bladder, with multiple small bladder calculi.  Small bowel gas in the urinary bladder, fistula cannot be excluded.  Central left lower lobe infiltrate, pneumonia cannot  be excluded.  Small right pleural effusion.  PHYSICAL EXAM:  VITAL SIGNS:  Temperature 97.8, blood pressure 111/82, heart rate 94, respirations 18, saturations 99% on room air. GENERAL:  Contracted, debilitated, very thin, very frail appearing. HEAD:  Normocephalic, atraumatic.  Mucous membranes of her mouth are pink but very dry.  No obvious lesion or exudate in her nose or ears. NECK:  Supple.  No JVD.  No lymphadenopathy. CV:  Regular rate and rhythm.  No murmur, gallop or rub.  No lower extremity edema. RESPIRATORY:  No increased work of breathing.  Respiration somewhat shallow, breath sounds distant.  No wheezes, rales or rhonchi noted. ABDOMEN:  Mildly to moderately distended, slightly firm, tender to palpation throughout but particularly in the left lower quadrant.  Bowel sounds very very sluggish. NEURO:  The patient is nonverbal, unable to follow commands. MUSCULOSKELETAL:  Very contracted, otherwise no joint swelling/erythema. Joints appear to be nontender to palpation.  ASSESSMENT AND PLAN: 1. Abdominal pain.  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  CT with positive     colonic ileus versus pseudo-obstruction.  We will admit to regular     floor.  We will keep n.p.o. and nothing per tube.  We will request     a surgical consult.  We will provide Protonix IV and switch     medications to IV. 2. Recent pneumonia.  Went home on Avelox p.o. for seven more days.     We will continue IV Avelox. 3. History of seizure disorder.  We will provide Dilantin IV, placed     on seizure precautions. 4. Cerebral palsy with spasticity.  We will stop baclofen for now     secondary to problem number one. 5. Deep venous thrombosis prophylaxis.  We will use SCDs.  CODE STATUS:  The patient is a DNR  This assessment and plan was discussed with Dr. Orvan Falconer.  It was truly a pleasure to take care of Renee [name].     Gwenyth Bender, NP   ______________________________ Vania Rea,  M.D.    KMB/MEDQ  D:  08/02/2010  T:  08/03/2010  Job:  [id number]  cc:   Corrie Mckusick, M.D.  Electronically Signed by Vania Rea M.D. on 08/05/2010 06:46:58 PM Electronically Signed by Toya Smothers  on 08/22/2010 12:41:23 PM

## 2010-08-22 NOTE — Progress Notes (Signed)
Summary: congestion / cough  Phone Note Call from Patient Call back at [phone]   Caller: patient's mom, [name] Call For: sood Summary of Call: congesion  and cough have gotten worse  [facility] Initial call taken by: Rickard Patience,  October 21, 2009 8:43 AM  Follow-up for Phone Call        called and spoke with pt's mother, [name].  Pt was just seen by VS on monday this week 10-17-2009. [name] states pt c/o increased coughing.  [name] states she gives pt Delsym to help with coughing but "doesn't seem to help."  [name] states coughing was "much worse last night that pt vomited x 2" and there was dark red blood mixed in with vomit.  [name] also c/o increased congestion and coughing up clear to yellow sputum, although [name] wonders if the yellow sputum could be d/t the Jevity.  [name] states she thinks pt's congestion and cough have gotten worse since starting Budesonide two times a day.  [name] states pt is also using albuterol nebs and Acetylcysteine nebs.  Denies fever.  Please advise.  Aundra Millet Reynolds LPN  October 22, 7671 9:19 AM   Additional Follow-up for Phone Call Additional follow up Details #1::        Spoke with mother.  She thinks her throat is getting irritated from budesonide.  She has been having more cough after using budesonide, but gets better after albuterol.  She has been  throwing up some after coughing, and bringing up brown to red material.  The mother is not sure if she is throwing up some blood.    I have advised her to stop budesonide.  She is to continue albuterol qid as needed, and mucomyst two times a day.  Advised her to call if no better.  Additional Follow-up by: Coralyn Helling MD,  October 21, 2009 5:39 PM    New/Updated Medications: ALBUTEROL SULFATE (2.5 MG/3ML) 0.083% NEBU (ALBUTEROL SULFATE) nebulized four times per day as needed

## 2010-08-22 NOTE — Miscellaneous (Signed)
Summary: [facility]   Imported By: Sherian Rein 10/21/2009 10:35:38  _____________________________________________________________________  External Attachment:    Type:   Image     Comment:   External Document

## 2010-08-22 NOTE — Letter (Signed)
Summary: [facility]   Imported By: Sherian Rein 10/21/2009 10:52:01  _____________________________________________________________________  External Attachment:    Type:   Image     Comment:   External Document

## 2010-08-22 NOTE — Miscellaneous (Signed)
Summary: Norval Morton NP  MOST/Tina Goodpasture NP   Imported By: Sherian Rein 10/21/2009 10:40:58  _____________________________________________________________________  External Attachment:    Type:   Image     Comment:   External Document

## 2010-08-22 NOTE — Progress Notes (Signed)
Summary: talk to nurse  Phone Note Call from Patient Call back at [phone]   Caller: Mom Call For: sood Summary of Call: want to discuss daughters medication Initial call taken by: Rickard Patience,  December 22, 2009 9:21 AM  Follow-up for Phone Call        Pt mother states she tried cutting back on mucomyst from twice daily to once daily and after about 2 days the pt had incresed congestion and difficulty breahting so she would increase back to twice daily. Mother states she tried this a few times and the same thing alway happened so she has stayed at twice daily. Mother also states that when she went to get a refill her pharmacy told her mucomyst is on a backorder from manufacturer and there is no known release date. She states she only has 3 weeks left of the medication and wants to know what VS recs, is there an alternative med? Please advise. Carron Curie CMA  December 22, 2009 11:37 AM   Additional Follow-up for Phone Call Additional follow up Details #1::        Explained to mother that there is a Sport and exercise psychologist on several medications with mucomyst being one of these with limited supply.  Will try scopolamine patch.  She is to continue with mucomyst while her supply lasts.  Explained to monitor for visual problems, constipation, and difficulty with urination while using scopolamine. Additional Follow-up by: Coralyn Helling MD,  December 22, 2009 1:32 PM    New/Updated Medications: TRANSDERM-SCOP 1.5 MG PT72 (SCOPOLAMINE BASE) one patch topical behind the ear once daily as needed Prescriptions: TRANSDERM-SCOP 1.5 MG PT72 (SCOPOLAMINE BASE) one patch topical behind the ear once daily as needed  #30 x 2   Entered and Authorized by:   Coralyn Helling MD   Signed by:   Coralyn Helling MD on 12/22/2009   Method used:   Electronically to        [facility] (retail)       [address]       [place], [place]  [address]       Ph: [phone]       Fax:  [phone]   RxID:   [id number]

## 2010-08-22 NOTE — Progress Notes (Signed)
Summary: PUL OXIMETRY  Phone Note From Pharmacy Call back at [phone]   Caller: [place] APOTH Call For: SOOD  Summary of Call: HAVE QUESTIONS ABOUT PUL OX Initial call taken by: Rickard Patience,  October 17, 2009 3:30 PM  Follow-up for Phone Call        Robbie Lis apoth. wanted to know if this was for a spot checker (the small finger pulse ox) or was it for the large ones that are around $300 and ins does not cover this, told them it was for the finger pulse ox Follow-up by: Philipp Deputy CMA,  October 17, 2009 4:11 PM

## 2010-08-22 NOTE — Letter (Signed)
Summary: [facility]   Imported By: Sherian Rein 10/21/2009 10:45:07  _____________________________________________________________________  External Attachment:    Type:   Image     Comment:   External Document

## 2010-08-22 NOTE — Assessment & Plan Note (Signed)
Summary: rov ///kp   Visit Type:  Follow-up Copy to:  [name] Primary Provider/Referring Provider:  Dr. [name]  CC:  The patient has had more mucus production and is not using the Mucamyst unless absolutely necessary. Mucomyst is on back order.Marland Kitchen  History of Present Illness: 25 yo female with dyspnea, hypoxemia, and reactive airways disease.  Pt is DNR.  She has been doing okay.  She has been using her albuterol more.  She has a cough with clear sputum.  She has not have fever, hemoptysis, sinus congestion.     Current Medications (verified): 1)  Albuterol Sulfate (2.5 Mg/65ml) 0.083% Nebu (Albuterol Sulfate) .... Nebulized Four Times Per Day As Needed 2)  Acetylcysteine 10 % Soln (Acetylcysteine) .... Two Times A Day As Needed 3)  Primidone 50 Mg Tabs (Primidone) .... 3 Three Times A Day 4)  Baclofen 20 Mg Tabs (Baclofen) .Marland Kitchen.. 1 1/2 Three Times A Day 5)  Risperdal 0.5 Mg Tabs ([name]) .... Two Times A Day 6)  Do Not Resuscitate  Allergies (verified): 1)  ! Pcn 2)  ! * Adhesive Tape  Past History:  Past Medical History: Reviewed history from 10/17/2009 and no changes required. Cerbral palsy Mental retardation Hydrocephalus s/p VP shunt Seizure disorder Osteopenia Aspiration pneumonia Bilateral hip dysplasia DNR/DNI  Vital Signs:  Patient profile:   25 year old female Height:      48 inches (121.92 cm) Weight:      48 pounds (21.82 kg) BMI:     14.70 O2 Sat:      89 % on Room air Temp:     97.7 degrees F (36.50 degrees C) oral Pulse rate:   67 / minute BP sitting:   100 / 60  (left arm) Cuff size:   small  Vitals Entered By: Michel Bickers CMA (January 25, 2010 3:09 PM)  O2 Sat at Rest %:  89 O2 Flow:  Room air CC: The patient has had more mucus production and is not using the Mucamyst unless absolutely necessary. Mucomyst is on back order. Comments Medications reviewed and phone number verified. Michel Bickers [facility]  January 25, 2010 3:10  PM   Physical Exam  General:  Thin. Nose:  narrow nasal passages, clear drainage, no tenderness Mouth:  MP 2, no exudate Lungs:  no wheeze or rales Heart:  regular rhythm, normal rate, and no murmurs.   Extremities:  contracted Cervical Nodes:  no significant adenopathy   Impression & Recommendations:  Problem # 1:  REACTIVE AIRWAY DISEASE (ICD-493.90)  She has done well with her combination of mucomyst and albuterol.  Unfortunately mucomyst has been in short supply.  She has not tried scopalamine yet because she was worried about the possible side effects.  Problem # 2:  HYPOXEMIA (ICD-799.02)  She has a pulse oximeter and is placed on oxygen when her Sats are low.  Complete Medication List: 1)  Albuterol Sulfate (2.5 Mg/42ml) 0.083% Nebu (Albuterol sulfate) .... Nebulized four times per day as needed 2)  Acetylcysteine 10 % Soln (Acetylcysteine) .... Two times a day as needed 3)  Primidone 50 Mg Tabs (Primidone) .... 3 three times a day 4)  Baclofen 20 Mg Tabs (Baclofen) .Marland Kitchen.. 1 1/2 three times a day 5)  Risperdal 0.5 Mg Tabs ([name]) .... Two times a day 6)  Do Not Resuscitate   Other Orders: Est. Patient Level III (41660)  Patient Instructions: 1)  Follow up in 6 months

## 2010-08-22 NOTE — Letter (Signed)
Summary: Pt's Med List/[facility]  Pt's Med List/[facility]   Imported By: Sherian Rein 10/21/2009 10:47:51  _____________________________________________________________________  External Attachment:    Type:   Image     Comment:   External Document

## 2010-08-22 NOTE — Assessment & Plan Note (Signed)
Summary: rov 4-6 wks ///kp   Copy to:  [name] Primary Provider/Referring Provider:  Dr. [name]  CC:  The patients congestion has improved with the nebulizer treatments per caregivers..  History of Present Illness: 25 yo female with dyspnea, hypoxemia, and reactive airways disease.  She has been doing better.  She uses mucomyst two times a day, and albuterol two times a day to four times per day.  She has some cough with sputum, but this has improved.  She uses her oxygen whenever her pulse ox reading is low.  She has been sleeping better through the night.  She has not had fever.  Current Medications (verified): 1)  Albuterol Sulfate (2.5 Mg/40ml) 0.083% Nebu (Albuterol Sulfate) .... Nebulized Four Times Per Day As Needed 2)  Acetylcysteine 10 % Soln (Acetylcysteine) .... Two Times A Day As Needed 3)  Primidone 50 Mg Tabs (Primidone) .... 3 Three Times A Day 4)  Baclofen 20 Mg Tabs (Baclofen) .Marland Kitchen.. 1 1/2 Three Times A Day 5)  Risperdal 0.5 Mg Tabs ([name]) .... Two Times A Day 6)  Do Not Resuscitate 7)  Flonase 50 Mcg/act Susp (Fluticasone Propionate) .... Two Sprays Once Daily  Allergies (verified): 1)  ! Pcn 2)  ! * Adhesive Tape  Past History:  Past Medical History: Reviewed history from 10/17/2009 and no changes required. Cerbral palsy Mental retardation Hydrocephalus s/p VP shunt Seizure disorder Osteopenia Aspiration pneumonia Bilateral hip dysplasia DNR/DNI  Vital Signs:  Patient profile:   25 year old female Height:      48 inches (121.92 cm) Weight:      48 pounds (21.82 kg) BMI:     14.70 O2 Sat:      92 % on Room air Temp:     98.7 degrees F (37.06 degrees C) oral Pulse rate:   123 / minute BP sitting:   98 / 56  (right arm) Cuff size:   small  Vitals Entered By: Michel Bickers CMA (Nov 21, 2009 2:38 PM)  O2 Sat at Rest %:  92 O2 Flow:  Room air  Physical Exam  General:  Thin. Nose:  narrow nasal passages, clear drainage, no  tenderness Mouth:  MP 2, no exudate Neck:  no JVD.   Lungs:  b/l rhonchi, no wheezing, faint basilar rales Extremities:  contracted Cervical Nodes:  no significant adenopathy   Impression & Recommendations:  Problem # 1:  DYSPNEA (ICD-786.05)  Again her mother was quite clear that she would not want to subject Keilani to intubation or tracheostomy.  The primary focus of care should be directed at keeping her breathing comfortable.  Problem # 2:  REACTIVE AIRWAY DISEASE (ICD-493.90) She has done well with her combination of mucomyst and albuterol.  Will try to decrease the frequency of mucomyst as tolerated, but continue albuterol.  Problem # 3:  HYPOXEMIA (ICD-799.02)  She has a pulse oximeter and is placed on oxygen when her Sats are low.  Complete Medication List: 1)  Albuterol Sulfate (2.5 Mg/50ml) 0.083% Nebu (Albuterol sulfate) .... Nebulized four times per day as needed 2)  Acetylcysteine 10 % Soln (Acetylcysteine) .... Two times a day as needed 3)  Primidone 50 Mg Tabs (Primidone) .... 3 three times a day 4)  Baclofen 20 Mg Tabs (Baclofen) .Marland Kitchen.. 1 1/2 three times a day 5)  Risperdal 0.5 Mg Tabs ([name]) .... Two times a day 6)  Do Not Resuscitate  7)  Flonase 50 Mcg/act Susp (Fluticasone propionate) .... Two sprays  once daily  Other Orders: Est. Patient Level III (28413)  Patient Instructions: 1)  Try stopping mucomyst in the morning, but continue at night.  If okay, then use mucomyst as needed 2)  Follow up in 3 months   Immunization History:  Influenza Immunization History:    Influenza:  historical (04/22/2009)  Pneumovax Immunization History:    Pneumovax:  historical (07/24/2007)

## 2010-08-22 NOTE — Letter (Signed)
Summary: [facility]   Imported By: Sherian Rein 10/21/2009 10:53:28  _____________________________________________________________________  External Attachment:    Type:   Image     Comment:   External Document

## 2010-08-24 NOTE — Progress Notes (Signed)
Summary: [facility] is in [facility]- mom requests pulm dr  Phone Note Call from Patient   Caller: Mom Renee [name] Call For: [place] Pelto Summary of Call: [facility] in [facility]- [facility]9. pt's mom Renee [name] is very concerned that pt has fluid on lungs. pt was recently released from [facility] (1/10). now at [facility] for [facility] that have "basically shut down" per mom. pt had pna and mom has requested "several times through nurse at hosp" for a pulm consult which has been denied. mom is concerned given that pt is immobile (cerebal palsy). and is asking that pt be seen by pulm dr. (she is aware that VS is off but requests any associate. (has seen dr Heide Scales at hosp in the past per mom and would appreciate seeing him or any pulm assoc). mom's cell [phone] NOTE: mom also says that pt is currently on 5L o2 (from 2L) Initial call taken by: Tivis Ringer, CNA,  August 07, 2010 9:28 AM  Follow-up for Phone Call        Pt's mother is aware that the rounding physician will have to ask for pulmonary consult while pt is in the hospital. We cannot do that from the office. She is aware VS is out of the office today but that I will leave this msg for him so he is aware. She had verbal understanding of this. Follow-up by: Michel Bickers CMA,  August 07, 2010 11:28 AM

## 2010-08-24 NOTE — Assessment & Plan Note (Signed)
Summary: NP follow up - post hosp   Copy to:  [name] Primary Provider/Referring Provider:  Dr. [name]  CC:  post hosp follow up - mother states congestion has improved.  Marland Kitchen  History of Present Illness: 25 yo female with dyspnea, hypoxemia, and reactive airways disease.  Pt is DNR. She has hx of Cerbral palsy, Mental retardation, Hydrocephalus s/p VP shunt and Seizure disorder  August 14, 2010 --Presents for post hosp follow up. Mother states congestion has improved.  Her mother and caregiver are here today with pt. Admitted  07/29/2010-08/01/10 for   Pneumonia, most likely aspiration related, N/V. Tx w/ IV abx, and fluids ([facility]) Xray showed bilateral perihilar opacities. Discharged on Avelox x 7days. 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 Her constipation is improved but still has alot of gas and bloating. SHe was seen by GI and General surgery. She was felt to have a psuedo obstruction. Discharged clinidamycin. Congestion and mucus is better. Day 4 /7 of abx completed.  No hemoptysis , fever, n/v/d, edema. Has had large bm since dishcarge   Medications Prior to Update: 1)  Albuterol Sulfate (2.5 Mg/37ml) 0.083% Nebu (Albuterol Sulfate) .... Nebulized Four Times Per Day As Needed 2)  Acetylcysteine 10 % Soln (Acetylcysteine) .... Two Times A Day As Needed 3)  Primidone 50 Mg Tabs (Primidone) .... 3 Three Times A Day 4)  Baclofen 20 Mg Tabs (Baclofen) .Marland Kitchen.. 1 1/2 Three Times A Day 5)  Risperdal 0.5 Mg Tabs ([name]) .... Two Times A Day 6)  Do Not Resuscitate  Current Medications (verified): 1)  Albuterol Sulfate (2.5 Mg/75ml) 0.083% Nebu (Albuterol Sulfate) .... Nebulized Four Times Per Day As Needed 2)  Acetylcysteine 10 % Soln (Acetylcysteine) .Marland Kitchen.. 1 Vial in Nebulizer Every 4 Hours As Needed Congestion 3)  Primidone 50 Mg Tabs (Primidone) .... 4 Tabs  Every Morning, 3 Tabs in The Afternoon, 4 Tabs At  Bedtime 4)  Baclofen 10 Mg Tabs (Baclofen) .Marland Kitchen.. 1-1/2 Tablets in The Morning, 1-1/2 Tablets in The Afternoon, 2 Tablets At Bedtime 5)  Risperdal 0.5 Mg Tabs ([name]) .Marland Kitchen.. 1 Tablet in The Mornng, 2 Tablets in The Evening 6)  Do Not Resuscitate 7)  Miralax  Powd (Polyethylene Glycol 3350) .Marland Kitchen.. 17grams Daily 8)  Childrens Ibuprofen 100 Mg/40ml Susp (Ibuprofen) .... Per Bottle 9)  Mucinex Cold For Kids 2.5-100 Mg/82ml Liqd (Phenylephrine-Guaifenesin) .... 20ml Per G-Tube Daily As Needed Congestion 10)  Jevity  Liqd (Nutritional Supplements) .... 5 Cans Daily With 8 Ounces of Water Flush Per G-Tube After Each Can  Allergies (verified): 1)  ! Pcn 2)  ! * Adhesive Tape  Past History:  Past Medical History: Last updated: 10/17/2009 Cerbral palsy Mental retardation Hydrocephalus s/p VP shunt Seizure disorder Osteopenia Aspiration pneumonia Bilateral hip dysplasia DNR/DNI  Family History: Last updated: 10/17/2009 Family History Breast Cancer---MGM Heart disease===MGF  Social History: Last updated: 10/17/2009 Patient never smoked.  Lives with her mother, Talise Sligh  Risk Factors: Smoking Status: never (10/17/2009)  Review of Systems      See HPI  Vital Signs:  Patient profile:   25 year old female Height:      48 inches O2 Sat:      90 % on Room air Temp:     97.7 degrees F oral Pulse rate:   99 / minute BP sitting:   98 / 62  (right arm) Cuff size:   small  Vitals Entered By:  Boone Master CNA/MA (August 14, 2010 9:41 AM)  O2 Flow:  Room air CC: post hosp follow up - mother states congestion has improved.   Is Patient Diabetic? No Comments Medications reviewed with patient Daytime contact number verified with patient. Boone Master CNA/MA  August 14, 2010 9:43 AM    Physical Exam  Additional Exam:  GEN: chronically ill appearing fraile female -contracted on exam table.  HEENT:   NOSE-clear, THROAT-clear NECK:  Supple w/ no JVD; normal carotid impulses w/o  bruits; no thyromegaly or nodules palpated; no lymphadenopathy. RESP  Coarse BS w/ scattered rhonchi.  CARD:  RRR, no m/r/g   GI:   Soft & nt; nml bowel sounds; no organomegaly or masses detected, Gtube in place.  Musco: Warm bil,  no calf tenderness edema, clubbing, pulses intact, contracted legs/ arms skin intact    Impression & Recommendations:  Problem # 1:  Hx of PNEUMONIA (ICD-486)  recent admission w/[facility] Pna-clinically improving slowly rec to finish abx  cont w/ pulmonary hygiene w/ nebs  Plan:  Finish Clindamycin as directed.  Continue nebs as needed  follow up Dr. Craige Cotta in 2 weeks and as needed  Please contact office for sooner follow up if symptoms do not improve or worsen   Orders: Est. Patient Level III (16109)  Medications Added to Medication List This Visit: 1)  Acetylcysteine 10 % Soln (Acetylcysteine) .Marland Kitchen.. 1 vial in nebulizer every 4 hours as needed congestion 2)  Primidone 50 Mg Tabs (Primidone) .... 4 tabs  every morning, 3 tabs in the afternoon, 4 tabs at bedtime 3)  Baclofen 10 Mg Tabs (Baclofen) .Marland Kitchen.. 1-1/2 tablets in the morning, 1-1/2 tablets in the afternoon, 2 tablets at bedtime 4)  Risperdal 0.5 Mg Tabs ([name]) .Marland Kitchen.. 1 tablet in the mornng, 2 tablets in the evening 5)  Miralax Powd (Polyethylene glycol 3350) .Marland Kitchen.. 17grams daily 6)  Childrens Ibuprofen 100 Mg/80ml Susp (Ibuprofen) .... Per bottle 7)  Mucinex Cold For Kids 2.5-100 Mg/31ml Liqd (Phenylephrine-guaifenesin) .... 20ml per g-tube daily as needed congestion 8)  Jevity Liqd (Nutritional supplements) .... 5 cans daily with 8 ounces of water flush per g-tube after each can  Complete Medication List: 1)  Albuterol Sulfate (2.5 Mg/39ml) 0.083% Nebu (Albuterol sulfate) .... Nebulized four times per day as needed 2)  Acetylcysteine 10 % Soln (Acetylcysteine) .Marland Kitchen.. 1 vial in nebulizer every 4 hours as needed congestion 3)  Primidone 50 Mg Tabs (Primidone) .... 4 tabs  every morning, 3 tabs in the  afternoon, 4 tabs at bedtime 4)  Baclofen 10 Mg Tabs (Baclofen) .Marland Kitchen.. 1-1/2 tablets in the morning, 1-1/2 tablets in the afternoon, 2 tablets at bedtime 5)  Risperdal 0.5 Mg Tabs ([name]) .Marland Kitchen.. 1 tablet in the mornng, 2 tablets in the evening 6)  Miralax Powd (Polyethylene glycol 3350) .Marland Kitchen.. 17grams daily 7)  Childrens Ibuprofen 100 Mg/80ml Susp (Ibuprofen) .... Per bottle 8)  Mucinex Cold For Kids 2.5-100 Mg/10ml Liqd (Phenylephrine-guaifenesin) .... 20ml per g-tube daily as needed congestion 9)  Jevity Liqd (Nutritional supplements) .... 5 cans daily with 8 ounces of water flush per g-tube after each can 10)  Do Not Resuscitate   Patient Instructions: 1)  Finish Clindamycin as directed.  2)  Continue nebs as needed  3)  follow up Dr. Craige Cotta in 2 weeks and as needed  4)  Please contact office for sooner follow up if symptoms do not improve or worsen    Immunization History:  Influenza Immunization History:    Influenza:  historical (04/22/2010)

## 2010-08-25 NOTE — Letter (Signed)
Summary: [facility]   Imported By: Sherian Rein 10/21/2009 10:55:16  _____________________________________________________________________  External Attachment:    Type:   Image     Comment:   External Document

## 2010-09-07 ENCOUNTER — Ambulatory Visit: Payer: Self-pay | Admitting: Pulmonary Disease

## 2010-09-20 ENCOUNTER — Ambulatory Visit (INDEPENDENT_AMBULATORY_CARE_PROVIDER_SITE_OTHER): Payer: 59 | Admitting: Pulmonary Disease

## 2010-09-20 ENCOUNTER — Encounter: Payer: Self-pay | Admitting: Pulmonary Disease

## 2010-09-20 DIAGNOSIS — R0902 Hypoxemia: Secondary | ICD-10-CM

## 2010-09-20 DIAGNOSIS — G809 Cerebral palsy, unspecified: Secondary | ICD-10-CM

## 2010-09-20 DIAGNOSIS — R0602 Shortness of breath: Secondary | ICD-10-CM

## 2010-09-20 DIAGNOSIS — J45909 Unspecified asthma, uncomplicated: Secondary | ICD-10-CM

## 2010-09-28 NOTE — Assessment & Plan Note (Signed)
Summary: 2 WEEK FOLLOW-UP/LED   Copy to:  [name] Primary Provider/Referring Provider:  Dr. [name]  CC:  2 week follow up. Pt mother states she is feeling much better. Breathing has been doing "fine", occas wheezing, and pt has cough but is hard to get any phlem up. Marland Kitchen  History of Present Illness: 25 yo female with dyspnea, hypoxemia, and reactive airways disease.  Pt is DNR. She has hx of Cerbral palsy, Mental retardation, Hydrocephalus s/p VP shunt and Seizure disorder  She was hospitalized in January for aspiratin, UTI, and Ogilvie syndrome.  She has improved.  Her mother gives neb tx about 2 to 4 times per day.  She has trouble swallowing her saliva, and gets choking spells.    Current Medications (verified): 1)  Albuterol Sulfate (2.5 Mg/3ml) 0.083% Nebu (Albuterol Sulfate) .... Nebulized Four Times Per Day As Needed 2)  Acetylcysteine 10 % Soln (Acetylcysteine) .Marland Kitchen.. 1 Vial in Nebulizer Every 4 Hours As Needed Congestion 3)  Primidone 50 Mg Tabs (Primidone) .... 4 Tabs  Every Morning, 3 Tabs in The Afternoon, 4 Tabs At Bedtime 4)  Baclofen 10 Mg Tabs (Baclofen) .... 2 Tablets in The Morning, 1-1/2 Tablets in The Afternoon, 2 Tablets At Bedtime 5)  Risperdal 0.5 Mg Tabs ([name]) .Marland Kitchen.. 1 Tablet in The Mornng, 2 Tablets in The Evening 6)  Miralax  Powd (Polyethylene Glycol 3350) .Marland Kitchen.. 17grams Daily 7)  Childrens Ibuprofen 100 Mg/87ml Susp (Ibuprofen) .... Per Bottle 8)  Mucinex Cold For Kids 2.5-100 Mg/21ml Liqd (Phenylephrine-Guaifenesin) .... 20ml Per G-Tube Daily As Needed Congestion 9)  Jevity  Liqd (Nutritional Supplements) .... 5 Cans Daily With 8 Ounces of Water Flush Per G-Tube After Each Can 10)  Do Not Resuscitate 11)  Gas-X Infant Drops 20 Mg/0.64ml Liqd (Simethicone) .... 3 Times A Day  Allergies (verified): 1)  ! Pcn 2)  ! 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 Pt mother states she is feeling much better. Breathing has been doing "fine", occas wheezing, pt has cough but is hard to get any phlem up.  Comments meds and allergies updated Phone number updated Carver Fila  September 20, 2010 9:07 AM    Physical Exam  General:  Thin. Nose:  narrow nasal passages, clear drainage, no tenderness Mouth:  MP 2, no exudate Neck:  no JVD.   Lungs:  no wheeze or rales, scattered rhonchi Heart:  regular rhythm, normal rate, and no murmurs.   Abdomen:  thin, soft, PEG site clean Extremities:  contracted Neurologic:  Awake, no verbal, moans intermittently Cervical Nodes:  no significant adenopathy   Impression & Recommendations:  Problem # 1:  DYSPNEA (ICD-786.05)  Again her mother was quite clear that she would not want to subject Renee [name] to intubation or tracheostomy.  The primary focus of care should be directed at keeping her breathing comfortable.  Problem #  2:  REACTIVE AIRWAY DISEASE (ICD-493.90)  She has done well with her combination of mucomyst and albuterol.  Advised to also try using suction of her oropharynx when she gets her choking spells.  Problem # 3:  HYPOXEMIA (ICD-799.02)  She has a pulse oximeter and is placed on oxygen when her Sats are low.  Problem # 4:  CEREBRAL PALSY (ICD-343.9)  She is to f/u with Dr. Sharene Skeans.  Medications Added to Medication List This Visit: 1)  Baclofen 10 Mg Tabs (Baclofen) .... 2 tablets in the morning, 1-1/2 tablets in the afternoon, 2 tablets at bedtime 2)  Gas-x Infant Drops 20  Mg/0.40ml Liqd (Simethicone) .... 3 times a day  Complete Medication List: 1)  Albuterol Sulfate (2.5 Mg/51ml) 0.083% Nebu (Albuterol sulfate) .... Nebulized four times per day as needed 2)  Acetylcysteine 10 % Soln (Acetylcysteine) .Marland Kitchen.. 1 vial in nebulizer every 4 hours as needed congestion 3)  Primidone 50 Mg Tabs (Primidone) .... 4 tabs  every morning, 3 tabs in the afternoon, 4 tabs at bedtime 4)  Baclofen 10 Mg Tabs (Baclofen) .... 2 tablets in the morning, 1-1/2 tablets in the afternoon, 2 tablets at bedtime 5)  Risperdal 0.5 Mg Tabs ([name]) .Marland Kitchen.. 1 tablet in the mornng, 2 tablets in the evening 6)  Miralax Powd (Polyethylene glycol 3350) .Marland Kitchen.. 17grams daily 7)  Childrens Ibuprofen 100 Mg/44ml Susp (Ibuprofen) .... Per bottle 8)  Mucinex Cold For Kids 2.5-100 Mg/81ml Liqd (Phenylephrine-guaifenesin) .... 20ml per g-tube daily as needed congestion 9)  Jevity Liqd (Nutritional supplements) .... 5 cans daily with 8 ounces of water flush per g-tube after each can 10)  Do Not Resuscitate  11)  Gas-x Infant Drops 20 Mg/0.53ml Liqd (Simethicone) .... 3 times a day  Other Orders: Est. Patient Level III (53664)  Patient Instructions: 1)  Follow up in 3 to 4 months

## 2010-10-31 LAB — PROTIME-INR: Prothrombin Time: 16.3 seconds — ABNORMAL HIGH (ref 11.6–15.2)

## 2010-10-31 LAB — BLOOD GAS, ARTERIAL
O2 Saturation: 94.2 %
PEEP: 5 cmH2O
Patient temperature: 37
RATE: 12 resp/min
pH, Arterial: 7.322 — ABNORMAL LOW (ref 7.350–7.400)
pO2, Arterial: 86.2 mmHg (ref 80.0–100.0)

## 2010-10-31 LAB — URINALYSIS, ROUTINE W REFLEX MICROSCOPIC
Bilirubin Urine: NEGATIVE
Bilirubin Urine: NEGATIVE
Hgb urine dipstick: NEGATIVE
Ketones, ur: NEGATIVE mg/dL
Nitrite: NEGATIVE
Nitrite: NEGATIVE
Protein, ur: NEGATIVE mg/dL
Specific Gravity, Urine: 1.01 (ref 1.005–1.030)
Specific Gravity, Urine: 1.015 (ref 1.005–1.030)
Urobilinogen, UA: 0.2 mg/dL (ref 0.0–1.0)
Urobilinogen, UA: 1 mg/dL (ref 0.0–1.0)
pH: 6.5 (ref 5.0–8.0)

## 2010-10-31 LAB — CBC
HCT: 32.5 % — ABNORMAL LOW (ref 36.0–46.0)
HCT: 35.3 % — ABNORMAL LOW (ref 36.0–46.0)
HCT: 35.9 % — ABNORMAL LOW (ref 36.0–46.0)
Hemoglobin: 14.6 g/dL (ref 12.0–15.0)
Hemoglobin: 11 g/dL — ABNORMAL LOW (ref 12.0–15.0)
Hemoglobin: 12.4 g/dL (ref 12.0–15.0)
Hemoglobin: 12.6 g/dL (ref 12.0–15.0)
MCHC: 35.6 g/dL (ref 30.0–36.0)
MCHC: 34.8 g/dL (ref 30.0–36.0)
MCV: 100.9 fL — ABNORMAL HIGH (ref 78.0–100.0)
MCV: 101.1 fL — ABNORMAL HIGH (ref 78.0–100.0)
Platelets: 193 10*3/uL (ref 150–400)
RBC: 3.22 MIL/uL — ABNORMAL LOW (ref 3.87–5.11)
RBC: 3.51 MIL/uL — ABNORMAL LOW (ref 3.87–5.11)
RBC: 3.56 MIL/uL — ABNORMAL LOW (ref 3.87–5.11)
RDW: 12.8 % (ref 11.5–15.5)
RDW: 12.6 % (ref 11.5–15.5)
WBC: 6.6 10*3/uL (ref 4.0–10.5)
WBC: 8 10*3/uL (ref 4.0–10.5)

## 2010-10-31 LAB — BASIC METABOLIC PANEL
BUN: 9 mg/dL (ref 6–23)
BUN: 2 mg/dL — ABNORMAL LOW (ref 6–23)
BUN: 2 mg/dL — ABNORMAL LOW (ref 6–23)
CO2: 29 mEq/L (ref 19–32)
CO2: 25 mEq/L (ref 19–32)
CO2: 29 mEq/L (ref 19–32)
CO2: 29 mEq/L (ref 19–32)
Calcium: 8.7 mg/dL (ref 8.4–10.5)
Calcium: 8.2 mg/dL — ABNORMAL LOW (ref 8.4–10.5)
Calcium: 8.3 mg/dL — ABNORMAL LOW (ref 8.4–10.5)
Calcium: 8.8 mg/dL (ref 8.4–10.5)
Chloride: 104 mEq/L (ref 96–112)
Chloride: 104 mEq/L (ref 96–112)
Chloride: 106 mEq/L (ref 96–112)
Chloride: 111 mEq/L (ref 96–112)
Creatinine, Ser: <0.3 mg/dL — ABNORMAL LOW (ref 0.4–1.2)
Creatinine, Ser: <0.3 mg/dL — ABNORMAL LOW (ref 0.4–1.2)
Glucose, Bld: 101 mg/dL — ABNORMAL HIGH (ref 70–99)
Glucose, Bld: 73 mg/dL (ref 70–99)
Glucose, Bld: 89 mg/dL (ref 70–99)
Potassium: 3.2 mEq/L — ABNORMAL LOW (ref 3.5–5.1)
Potassium: 3.3 mEq/L — ABNORMAL LOW (ref 3.5–5.1)
Potassium: 3.4 mEq/L — ABNORMAL LOW (ref 3.5–5.1)
Potassium: 3.6 mEq/L (ref 3.5–5.1)
Potassium: 4.1 mEq/L (ref 3.5–5.1)
Sodium: 137 mEq/L (ref 135–145)
Sodium: 138 mEq/L (ref 135–145)
Sodium: 140 mEq/L (ref 135–145)

## 2010-10-31 LAB — POCT I-STAT 3, ART BLOOD GAS (G3+)
Acid-base deficit: 2 mmol/L (ref 0.0–2.0)
Bicarbonate: 22.7 mEq/L (ref 20.0–24.0)
Bicarbonate: 23.5 mEq/L (ref 20.0–24.0)
O2 Saturation: 95 %
O2 Saturation: 98 %
Patient temperature: 98.1
Patient temperature: 98.6
TCO2: 24 mmol/L (ref 0–100)
TCO2: 25 mmol/L (ref 0–100)
TCO2: 26 mmol/L (ref 0–100)
pCO2 arterial: 36 mmHg (ref 35.0–45.0)
pH, Arterial: 7.449 — ABNORMAL HIGH (ref 7.350–7.400)
pO2, Arterial: 78 mmHg — ABNORMAL LOW (ref 80.0–100.0)
pO2, Arterial: 89 mmHg (ref 80.0–100.0)

## 2010-10-31 LAB — GLUCOSE, CAPILLARY
Glucose-Capillary: 116 mg/dL — ABNORMAL HIGH (ref 70–99)
Glucose-Capillary: 122 mg/dL — ABNORMAL HIGH (ref 70–99)
Glucose-Capillary: 69 mg/dL — ABNORMAL LOW (ref 70–99)
Glucose-Capillary: 90 mg/dL (ref 70–99)
Glucose-Capillary: 94 mg/dL (ref 70–99)

## 2010-10-31 LAB — PRIMIDONE AND METABOLITE LEVEL: Phenobarbital: 20.4 ug/mL (ref 15.0–40.0)

## 2010-10-31 LAB — DIFFERENTIAL
Basophils Absolute: 0 10*3/uL (ref 0.0–0.1)
Basophils Relative: 0 % (ref 0–1)
Eosinophils Absolute: 0.2 10*3/uL (ref 0.0–0.7)
Eosinophils Relative: 2 % (ref 0–5)
Lymphocytes Relative: 5 % — ABNORMAL LOW (ref 12–46)
Lymphs Abs: 0.4 10*3/uL — ABNORMAL LOW (ref 0.7–4.0)
Monocytes Absolute: 0.5 10*3/uL (ref 0.1–1.0)
Monocytes Absolute: 0.5 10*3/uL (ref 0.1–1.0)
Monocytes Relative: 7 % (ref 3–12)
Neutro Abs: 9.9 10*3/uL — ABNORMAL HIGH (ref 1.7–7.7)
Neutrophils Relative %: 86 % — ABNORMAL HIGH (ref 43–77)

## 2010-10-31 LAB — CULTURE, BLOOD (ROUTINE X 2)
Culture: NO GROWTH
Report Status: 5292010

## 2010-10-31 LAB — AMYLASE: Amylase: 134 U/L — ABNORMAL HIGH (ref 27–131)

## 2010-10-31 LAB — CULTURE, BAL-QUANTITATIVE W GRAM STAIN

## 2010-10-31 LAB — LACTIC ACID, PLASMA: Lactic Acid, Venous: 2.4 mmol/L — ABNORMAL HIGH (ref 0.5–2.2)

## 2010-10-31 LAB — CORTISOL: Cortisol, Plasma: 10.2 ug/dL

## 2010-11-21 ENCOUNTER — Other Ambulatory Visit: Payer: Self-pay | Admitting: Urology

## 2010-11-21 ENCOUNTER — Ambulatory Visit (INDEPENDENT_AMBULATORY_CARE_PROVIDER_SITE_OTHER): Payer: 59 | Admitting: Urology

## 2010-11-21 DIAGNOSIS — G809 Cerebral palsy, unspecified: Secondary | ICD-10-CM

## 2010-11-21 DIAGNOSIS — N390 Urinary tract infection, site not specified: Secondary | ICD-10-CM

## 2010-11-27 ENCOUNTER — Ambulatory Visit (HOSPITAL_COMMUNITY)
Admission: RE | Admit: 2010-11-27 | Discharge: 2010-11-27 | Disposition: A | Discharge status: Home / Self Care | Payer: 59 | Source: Ambulatory Visit | Attending: Urology | Admitting: Urology

## 2010-11-27 DIAGNOSIS — N390 Urinary tract infection, site not specified: Secondary | ICD-10-CM | POA: Insufficient documentation

## 2010-11-27 DIAGNOSIS — R9389 Abnormal findings on diagnostic imaging of other specified body structures: Secondary | ICD-10-CM | POA: Insufficient documentation

## 2010-11-30 ENCOUNTER — Ambulatory Visit: Payer: 59 | Admitting: Pulmonary Disease

## 2010-12-05 NOTE — Consult Note (Signed)
Renee [name], Renee [name]                  ACCOUNT NO.:  [id number]   MEDICAL RECORD NO.:  [id number]          PATIENT TYPE:  INP   LOCATION:  A335                          FACILITY:  [facility]   PHYSICIAN:  R. Roetta Sessions, M.D. DATE OF BIRTH:  11-15-1985   DATE OF CONSULTATION:  05/10/2008  DATE OF DISCHARGE:                                 CONSULTATION   PRIMARY CARE PHYSICIAN:  Dr. Phillips Odor.   NEUROLOGIST:  Dr. Sharene Skeans in [place].   REASON FOR CONSULTATION:  Fecal impaction.   HISTORY OF PRESENT ILLNESS:  Renee [name] is a 25 year old Caucasian  female who has mental retardation with cerebral palsy and multiple  contractions.  She has a history of chronic constipation.  She was  admitted with pneumonia of the left lower lobe.  She has not had a bowel  movement in almost 1 week.  She has been on P-Col-Rite six per day at  home for constipation.  This is a combination of docusate and  sennosides, and she has been on this for several years per her mother.  She has also had p.r.n. milk of magnesia, as well as Metamucil, which  seems to help significantly.  She has a PEG tube which was placed  several years at [facility].  Her  mother notes that she has no complaints of abdominal pain.  She did  vomit once since she has been in the hospital.  Her last bowel movement  was approximately 1 week ago.  There is no history of rectal bleeding or  melena.  There is no history of GERD, heartburn or indigestion.  She  does have history of aspiration which is why she ended up a PEG tube  placement.  Her mother does occasionally feed her pudding or applesauce  per os.   She had abdominal films which showed a fair amount of stool in the  rectum, small bowel, colon was gas filled with probable fecal impaction.  Chest x-ray showed left lower lobe pneumonia.   PAST MEDICAL AND SURGICAL HISTORY:  1. Chest pain.  2. Mental retardation.  3. Seizure disorder.  4.  Hydrocephalous with ventriculoperitoneal shunt.  5. Osteopenia.  6. Bilateral hip dysplasia with spinal hardware.  7. PEG tube placed at [facility] a      couple of years ago.   MEDICATIONS PRIOR TO ADMISSION:  1. Primidone 200 mg in the morning, 150 mg at noon and 200 mg in the      p.m.  2. Baclofen 50 mg tablets t.i.d.  3. Over-the-counter P-Col-Rite six tables daily.   ALLERGIES:  PENICILLIN.   FAMILY HISTORY:  There is no known family history of colorectal  carcinoma, liver or chronic GI problems.  Mother, age 34, is healthy.  She is an only child.   SOCIAL HISTORY:  Renee [name] resides at home with her mother.  She is a  nonsmoker, nondrinker.  Denies any drug use/   REVIEW OF SYSTEMS:  PULMONARY:  She has had some cough.  CONSTITUTIONAL:  She has had  fever and chills.  GI:  See HPI.   PHYSICAL EXAMINATION:  VITAL SIGNS:  Temperature 94, pulse 115,  respirations 34, blood pressure of 90/54.  GENERAL:  Renee [name] is a frail-appearing, pale Caucasian female who is  awake.  She is in no acute distress.  She is very thin with  contractures.  She is accompanied by her mother and grandmother at  bedside.  HEENT:  Sclerae clear, nonicteric.  Conjunctivae pink.  Oropharynx pink  and moist.  NECK:  Supple without any mass or thyromegaly.  CHEST:  Heart regular rate with mild tachycardia.  LUNGS:  Inspiratory and expiratory wheezes bilaterally.  No acute  distress.  ABDOMEN:  She has a PEG tube in place with the site clear.  ABDOMEN:  Slightly distended.  She has positive bowel sounds x4.  There  are no bruits auscultated.  ABDOMEN:  Soft, nontender without palpable mass or hepatosplenomegaly.  There is no rebound tenderness or guarding.  RECTAL:  No external lesions visualized.  She has moderate sphincter  tone, given her age.  She has a significant amount of soft, formed brown  stool in the vault.  I did remove a small amount of stool but  much semi-  formed stool remains in the vault.  EXTREMITIES:  With contractures.  No edema.  She does have clubbing.   LABORATORY STUDIES:  Hemoglobin 12.1, hematocrit 34.7, white blood cell  count 1.7, platelet count 92.  Calcium 7.9, sodium 136, potassium 3.4,  chloride 105, CO2 of 27, BUN less than 1, creatinine 0.15, glucose 101  and phosphorus 1.8.   IMPRESSION:  Renee [name] is a 25 year old Caucasian female with cerebral  palsy and pneumonia.  She has a history of chronic constipation on over-  the-counter P-Col-Rite six daily per mother.  She has not had a bowel  movement in approximately 1 week, and abdominal films with a fair amount  of stool in the rectum which is soft on exam.  She has fecal impaction  and chronic constipation.  No evidence of ileus on plain films.  I have  discussed the case with Dr. Jena Gauss.   PLAN:  1. MiraLax 17 grams per PEG tube b.i.d.  2. Two tap water enemas per rectum tonight.  3. Will titrate MiraLax to get desired effect.  4. Would avoid fiber in this setting.   I would like to thank the [facility] team for allowing [place] to  participate in the care of Renee [name].      Lorenza Burton, N.P.      Jonathon Bellows, M.D.  Electronically Signed    KJ/MEDQ  D:  05/10/2008  T:  05/10/2008  Job:  [id number]   cc:   Deanna Artis. Sharene Skeans, M.D.  Fax: [phone]   Corrie Mckusick, M.D.  Fax: [phone]

## 2010-12-05 NOTE — Group Therapy Note (Signed)
Renee [name], Renee [name]                  ACCOUNT NO.:  [id number]   MEDICAL RECORD NO.:  [id number]          PATIENT TYPE:  INP   LOCATION:  A335                          FACILITY:  [facility]   PHYSICIAN:  Dorris Singh, DO    DATE OF BIRTH:  1986-04-22   DATE OF PROCEDURE:  DATE OF DISCHARGE:                                 PROGRESS NOTE   HISTORY:  The patient is seen today.  She is resting more comfortably.  She is seen with her aide in the room.  I had a chance to talk to her  mother later on in the day.  I did explain to her the course and plan of  care.  She was much more comfortable after breathing treatments and has  had 2 episodes of vomiting up mucus which has actually made her  breathing improved.   PHYSICAL EXAMINATION:  VITAL SIGNS:  Temperature is 98.5, pulse is 143,  respirations 22 and blood pressure 105/70.  GENERAL:  The patient is a thin, small, frail Caucasian female who was  unresponsive. HEART:  Tachy sinus.  LUNGS:  Decreased breath sounds in the right side.  ABDOMEN:  Soft, nontender, nondistended, a positive feeding tube.  EXTREMITIES:  Positive contractures, thin, but positive pulses.   LABORATORY DATA:  Her labs for today are as follows:  White count of  2.5, hemoglobin 14.6, hematocrit of 42.3, platelet count of 103.  BMP:  Sodium is 137, potassium 3.9, chloride 104, CO2 24, glucose 98, BUN 7,  creatinine 0.43.   ASSESSMENT/PLAN:  1. Left lobar pneumonia.  We will continue with Levaquin and      Zithromax, and see how the patient responds.  She is allergic to      Rocephin as mentioned before.  2. Urinary tract infection.  She should respond to Levaquin as well      for that.  We will continue to monitor.  3. Tachycardia.  We will go ahead and continue with fluid boluses.      The patient is probably very dehydrated.  We will continue with      that and continue to monitor.  4. Decreased white blood cells, leukopenia.  This has dropped from      yesterday in  the setting of lobar pneumonia.  We will continue to      monitor this as well.  We will change therapy as necessary.      Dorris Singh, DO  Electronically Signed     CB/MEDQ  D:  05/06/2008  T:  05/06/2008  Job:  [id number]

## 2010-12-05 NOTE — H&P (Signed)
Renee [name], Renee [name]                  ACCOUNT NO.:  [id number]   MEDICAL RECORD NO.:  [id number]          PATIENT TYPE:  INP   LOCATION:  A335                          FACILITY:  [facility]   PHYSICIAN:  Dorris Singh, DO    DATE OF BIRTH:  1986-05-11   DATE OF ADMISSION:  05/05/2008  DATE OF DISCHARGE:  LH                              HISTORY & PHYSICAL   PRIMARY CARE PHYSICIAN:  Dr. Phillips Odor.   CHIEF COMPLAINT:  Shortness of breath.   HISTORY:  The patient to 25 year old Caucasian female who is mentally  retarded with multiple contractions, who was brought in by her mother  with a chief complaint of fever and cough.  She has a level 5 caveat  because of MR.  The patient has a history of hydrocephalus and cerebral  palsy.  She was brought in to Dr. Lamar Blinks office that day.  Due to her  cough and presenting with a temperature of 102, she was recommended to  be evaluated.  Also mother says that she has not been eating much.  She  is fed by a feeding tube and she has not had much urine output.   PAST MEDICAL HISTORY:  1. Significant for cerebral palsy.  2. Seizure disorder.  She lives her mother.  3. She has altered mental status, impaired mobility and she is      visually impaired.   SOCIAL HISTORY:  She is nonsmoker, nondrinker and no drug abuse.   ALLERGIES:  According to her mother, she does have an allergy to  PENICILLIN which is not documented in the chart.  She was given Rocephin  while she was in the ED, but has not had any reactions, so we will go  ahead and change her antibiotics.   MEDICATIONS:  Her medication list that she is on includes several  medications:  1. Primidone, there is no dose given.  2. Baclofen, no dose given.  3. Motrin.   REVIEW OF SYSTEMS:  Caveat level 5, but per mother, fever, cough and  difficulty breathing.   PHYSICAL EXAMINATION:  VITAL SIGNS:  Blood pressure 106/70, pulse rate  137, respirations 32, temperature 98.7.  GENERAL:  The patient  is a 25 year old Caucasian female who is small  with multiple contractures.  She is unresponsive to questions.  HEENT:  Head is normocephalic.  The patient has hard to examine eyes.  HEART:  Tachy sinus.  No murmur is appreciated.  LUNGS:  Decreased breath sounds bilaterally, particularly more so on the  left side.  ABDOMEN:  Soft.  There is a feeding tube.  EXTREMITIES:  Contracted in flexor position, but there are positive  pulses.  They are thin as well.   LABORATORY DATA:  ABG:  Blood gas is 7.409, CO2 35, PO2 66.7, bicarb  22.1.  White count 3.5, hemoglobin 15.1, hematocrit 44.0, platelet count  165, sodium 134, potassium 3.6, chloride 101, CO2 24, glucose 86, BUN 9,  creatinine 0.39, AST is 39.  Her UA shows a few squamous cells as well.  Her blood cultures are pending.  ASSESSMENT/PLAN:  1. Left lobar pneumonia.  2. Urinary tract infection.  3. Hypoxia.   PLAN:  Admit the patient to service of [facility] with telemetry.  We  will do strict I's and O's. Place a Foley.  We will have respiratory do  Ventimask, non-rebreather and BiPAP.  The patient is being fed with a  feeding tube.  We will go ahead and cut her feedings down.  She is on  Jevity.  We will do one-quarter can feeding tube t.i.d. and we will  increase as tolerated.  We will get basic labs on her tomorrow and a  chest x-ray on May 07, 2008.  Also the patient was very tachy sinus.  Went ahead and got an EKG and it shows that she is tachy sinus.  At this  point, we will go ahead and continue the IV fluids.  We will give her a  fluid bolus.  Also we will place her on Levaquin 750 mg IV q.24 h.  We  will keep her on the Zithromax.  We will do DVT prophylaxis.  Give her  something for nausea and continue to monitor the patient.  We will do  PT/OT and case management.      Dorris Singh, DO  Electronically Signed     CB/MEDQ  D:  05/05/2008  T:  05/05/2008  Job:  [id number]   cc:   Phillips Odor, MD

## 2010-12-05 NOTE — Discharge Summary (Signed)
Renee [name], Renee [name]                  ACCOUNT NO.:  [id number]   MEDICAL RECORD NO.:  [id number]          PATIENT TYPE:  INP   LOCATION:  A335                          FACILITY:  [facility]   PHYSICIAN:  Dorris Singh, DO    DATE OF BIRTH:  June 20, 1986   DATE OF ADMISSION:  05/05/2008  DATE OF DISCHARGE:  10/20/2009LH                               DISCHARGE SUMMARY   ADMISSION DIAGNOSES:  1. Left lobar pneumonia.  2. Urinary tract infection.  3. Hypoxia.   DISCHARGE DIAGNOSES:  1. Left lobar pneumonia which is resolved.  2. Hypoxia.  3. Neutropenia.  4. Hypokalemia.  5. Fecal impaction.  6. Urinary tract infection.   PRIMARY CARE PHYSICIAN:  Edsel Petrin, D.O.   HISTORY OF PRESENT ILLNESS:  As summarized in her H and P.   HOSPITAL COURSE:  1. Left lobar pneumonia.  The patient was started on IV antibiotics,      nebulizer treatments.  She continued to do well.  She was started      on Levaquin and Zithromax.  She clinically improved.  We continued      to monitor her white count as well and also there was an issue with      actually doing breathing treatments on her and keeping her oxygen      on her.  She became very agitated so we would do breathing      treatments with blow-by and blow-by oxygen.  2. Urinary tract infection.  We used Levaquin and we continued to      monitor and she seemed to do well with that as well.  3. Hypokalemia.  This was replaced and resolved while she was here.      Also there was some concern about her nutritional status.  We had      recommended a  nutrition consult which was done, and they decided      that they would increase her a trial of Jevity 1.5 to increase her      calories and volume.  She apparently has been on tube feedings of      Jevity 1.2 according to her mother.  4. Also the patient was admitted initially with tachycardia.  This      resolved with fluid boluses.  There was some concern as to whether      or not the patient  could keep her IV site, so PICC line evaluation      was considered.  PICC line nurse stated that she could get a 24 in      her and did not feel that she could get any type of midline or PICC      line in her at this point in time.  5. There was some  fecal impaction.  There was some concern earlier in      the patient's admission.  The patient normally takes up to 6 Peri-      Colace a day.  However, she had not been eating.  She did have some      small  hard stools while she was here.  We gave her Dulcolax      suppositories and a Fleet's enema.  On the day prior to discharge      the patient still had not had an adequate bowel movement and she      was getting her tube feedings at her normal rate and she was noted      to have a distended abdomen.  An abdominal x-ray was done which      showed fecal impaction.  GI was consulted to help with this.  A      manual disimpaction was attempted as well as MiraLax use.  6. Also the patient was still having periods of hypoxia.  However, the      mother understands that this may happen if she recovers from the      pneumonia and they are going to continue the blow-by oxygen which      she seemed to tolerate.  7. For her neutropenia, I have discussed her case with Dr. Mariel Sleet.      He would like to have a repeat CBC in about 2 weeks and have them      follow up in 4 to make sure that her white count has gone back up      to its normal range.  Mother understands this and states this      understanding.   MEDICATIONS:  She will go home on:  1. Primidone 50 mg 4 tablets in the a.m. and 3 tablets at night.  2. Baclofen 10 mg 1/2 tablet 3 times a day.  3. Started her on MiraLax 17 grams b.i.d.  4. Xopenex 0.63 neb t.i.d. p.r.n.  5. Levaquin 500 mg p.o. daily x7 days.  6. Zithromax 250 mg p.o. daily x7 days.   DISCHARGE PHYSICAL EXAM:  VITALS:  Were stable.  LUNGS:  Clear to auscultation bilaterally.  ABDOMEN:  Positive distention, but soft  and nontender.  HEART:  Regular rate and rhythm.  EXTREMITIES: Thin with positive pulses.   White count today is 3.5.  Hemoglobin 13.1, hematocrit 37.5, platelet  count of 105 and her BMET is within normal range.   CONDITION AT DISCHARGE:  Is stable.   DISPOSITION:  Will be to home.   DISCHARGE INSTRUCTIONS:  Are to increase activity slowly and to continue  with her tube feedings as recommended.  She is to see Dr. Phillips Odor in 7-  10 days or sooner if needed.  It is recommended that she  follow up with  Dr. Mariel Sleet in 1 month and get a __________  CBC in 2 months.  Robitussin DM for cough encouraged fluids via feeding tube, and  breathing treatments three times daily.      Dorris Singh, DO  Electronically Signed     CB/MEDQ  D:  05/11/2008  T:  05/11/2008  Job:  [id number]   cc:   Edsel Petrin, D.O.  Fax: [phone]

## 2010-12-05 NOTE — Group Therapy Note (Signed)
NAMELAVELLA, MYREN                  ACCOUNT NO.:  [id number]   MEDICAL RECORD NO.:  [id number]          PATIENT TYPE:  INP   LOCATION:  A335                          FACILITY:  [facility]   PHYSICIAN:  Dorris Singh, DO    DATE OF BIRTH:  22-Apr-1986   DATE OF PROCEDURE:  05/09/2008  DATE OF DISCHARGE:                                 PROGRESS NOTE   The patient seen today getting a breathing treatment with her mother in  the room.  The patient's mom says she looks like she is doing better and  clinically the patient does.   PLAN:  Possibly discharge her tomorrow as long as she continues to  improve.   PHYSICAL EXAM:  VITAL SIGNS:  Blood pressure 92/69, heart rate 122,  respirations 20, temperature 97.3.  GENERAL:  The patient is a thin Caucasian female who is in no acute  distress.  She is alert.  HEART:  Tachy sinus.  No murmurs noted.  LUNGS:  Clear to auscultation bilaterally.  ABDOMEN:  Soft, nontender, nondistended.  EXTREMITIES:  Positive contractions they are thin.   LABS:  For today, she had a CBC with a hemoglobin 11.6, hematocrit 32.5,  white count 2.5, platelet count 85,000.  Chemistries sodium is 133,  potassium is 2.7, chloride is 103, CO2 27, BUN is 1, and creatinine is  less than 0.3.  Glucose is 91.  Her folate is pending and her vitamin  B12 is still pending as well.   ASSESSMENT/PLAN:  1. Pneumonia.  The patient is improving.  We will continue to follow.  2. Neutropenia.  The patient's is now at 0.25 for her white count.      Will go ahead and have heme consulted.  3. Also her sodium, hypokalemia.  We have replaced this.  Will check      it again.  4. She also have a low phosphorus level.  Will also replace that as      well.  5. She is hyponatremic.  We will stop fluids and see how well she can      eat and drink on her own.  6. Also are awaiting a nutrition consult as well.      Dorris Singh, DO  Electronically Signed     CB/MEDQ  D:  05/09/2008   T:  05/09/2008  Job:  [id number]

## 2010-12-05 NOTE — Discharge Summary (Signed)
Renee [name], Renee [name]                  ACCOUNT NO.:  [id number]   MEDICAL RECORD NO.:  [id number]          PATIENT TYPE:  INP   LOCATION:  2111                         FACILITY:  [facility]   PHYSICIAN:  Coralyn Helling, MD        DATE OF BIRTH:  Jan 18, 1986   DATE OF ADMISSION:  12/13/2008  DATE OF DISCHARGE:  12/18/2008                               DISCHARGE SUMMARY   FINAL DIAGNOSES:  1. Acute respiratory failure secondary to pneumonia (no organism      specified).  2. Probable aspiration.  3. Agitation.  4. History of cerebral palsy and debilitation.   PROCEDURES:  1. Endotracheal tube placed, May 24, removed May 26.  2. Right internal jugular vein catheter, placed May 24, removed May      29.   CONSULTANTS:  Deanna Artis. Hickling, MD   BRIEF HISTORY:  This is a 25 year old Caucasian female with a known  history of cerebral palsy resulting in severe debilitation, and multiple  contractions.  She is fully dependent on her mother at home for care.  She was brought to the emergency room at [facility] with chief  complaint of shortness of breath.  She also has a history of  hydrocephalus for which, she has a ventricular peritoneal shunt.  She  was admitted to the intensive care and transferred from [facility] for  aspiration pneumonia, and resultant severe sepsis.   HOSPITAL COURSE BY DISCHARGE DIAGNOSES:  1. Acute respiratory failure in the setting of presumed aspiration      pneumonia (no organism specified).  Ms. Higginbotham was admitted to the      intensive care at [facility].  Therapeutic interventions      included empiric antibiotics in the form of ciprofloxacin and      Flagyl, bronchioalveolar lavage was obtained.  This did not      demonstrate any organisms.  She continued to improve with positive      pressure, pulmonary hygiene support, and empiric antibiotics.  She      was successfully extubated on Dec 15, 2008.  From a pulmonary      standpoint, she continues  to have thick secretions, however her      mother reports that these were no different than baseline.  The      mother has supplemental oxygen at home, as well as suction device      and is fully equipped at home to care for Ms. Wingard' pulmonary      status.  Upon time of discharge, family reports Ms. Cuello to be      essentially at baseline pulmonary and cognitive function.      Therefore, Ms. Marrone will be discharged to home.  She will      complete one more day of Cipro.  This will consume 3 more total      doses.  She will be sent home with supplemental oxygen, which she      already has at home, and instructions to suction as needed.  The  mother was instructed to follow up with her primary care Lakeitha Basques,      Dr. Phillips Odor in 7-10 days for further evaluation.  2. History of cerebral palsy and seizure disorder.  For this, she is      on primidone.  She will be sent home on this medication and      instructed to follow up with Dr. Sharene Skeans in 3 weeks following      discharge.   DISCHARGE INSTRUCTIONS:  Diet n.p.o.  She is to continue Jevity bolus  tube feeds as previously instructed.  Followup Dr. Phillips Odor in 7-10 days,  Dr. Sharene Skeans in 2-3 weeks.   DISCHARGE MEDICATIONS:  1. Baclofen 10 mg tab, 1-1/2 tab 3 times a day.  2. Primidone 50 mg 4 tablets 3 times a day.  3. Prednisone 10 mg tablet, take 4 tablets x2 days, then 3 tablets x2      days, and 2 tablets x2 days, then one-time x2 days.  This was for      labial swelling felt to be secondary to trauma.  She did have      gynecological consultation who felt that this was not due to      infective process and was instructed to continue slow prednisone      taper and catheter was removed.  4. Cipro 500 mg tab 1 tablet every 12 hours x3 more doses, then      discontinue.   DISPOSITION:  Ms. Harm has met maximum benefit from inpatient care.  She is now cleared medically for discharge to home with followup as  previously  mentioned, please be sure copies sent to Dr. Phillips Odor and Dr.  Sharene Skeans.      Zenia Resides, NP      Coralyn Helling, MD  Electronically Signed    PB/MEDQ  D:  12/18/2008  T:  12/19/2008  Job:  [id number]   cc:   Corrie Mckusick, M.D.

## 2010-12-11 ENCOUNTER — Encounter: Payer: Self-pay | Admitting: Pulmonary Disease

## 2010-12-11 ENCOUNTER — Ambulatory Visit (INDEPENDENT_AMBULATORY_CARE_PROVIDER_SITE_OTHER): Payer: 59 | Admitting: Pulmonary Disease

## 2010-12-11 DIAGNOSIS — G809 Cerebral palsy, unspecified: Secondary | ICD-10-CM

## 2010-12-11 DIAGNOSIS — R0902 Hypoxemia: Secondary | ICD-10-CM

## 2010-12-11 DIAGNOSIS — J45909 Unspecified asthma, uncomplicated: Secondary | ICD-10-CM

## 2010-12-11 DIAGNOSIS — R0602 Shortness of breath: Secondary | ICD-10-CM

## 2010-12-11 MED ORDER — ALBUTEROL SULFATE (2.5 MG/3ML) 0.083% IN NEBU: INHALATION_SOLUTION | RESPIRATORY_TRACT | Status: DC

## 2010-12-11 MED ORDER — ACETYLCYSTEINE 10 % IN SOLN: RESPIRATORY_TRACT | Status: AC

## 2010-12-11 NOTE — Assessment & Plan Note (Signed)
She has a pulse oximeter and is placed on oxygen when her Sats are low.

## 2010-12-11 NOTE — Assessment & Plan Note (Addendum)
Related to reactive airways, and difficulty with control of secretions.  She is maintaining herself on current regimen.  Her mother is clear about wishes for DNR/DNI status.

## 2010-12-11 NOTE — Patient Instructions (Signed)
Follow up in 4 to 6 months 

## 2010-12-11 NOTE — Assessment & Plan Note (Signed)
F/U with Dr. Sharene Skeans.

## 2010-12-11 NOTE — Assessment & Plan Note (Signed)
She has done well with her combination of mucomyst and albuterol.

## 2010-12-11 NOTE — Progress Notes (Signed)
Subjective:    Patient ID: Renee [name], female    DOB: 06/09/1986, 25 y.o.   MRN: [id number] CC: Rich Fuchs HPI 25 yo female with dyspnea, hypoxemia, and reactive airways disease.  She is being treated for bladder infection.  She is followed by urology.  Her mother reports that she will get coughing/choking spells, but these usually clear after using nebulizer.  She usually runs an oxygen saturation of 88 to 90% at home.  Her mother has a pulse oximeter at home.  Past Medical History  Diagnosis Date  . Cerebral palsy   . Mental retardation   . Hydrocephalus   . Seizure disorder   . Osteopenia   . Hip dysplasia     bilateral  . DNR (do not resuscitate)   . DNI (do not intubate)      No family history on file.   History   Social History  . Marital Status: Single    Spouse Name: N/A    Number of Children: N/A  . Years of Education: N/A   Occupational History  . Not on file.   Social History Main Topics  . Smoking status: Never Smoker   . Smokeless tobacco: Not on file  . Alcohol Use: Not on file  . Drug Use: Not on file  . Sexually Active: Yes -- Female partner(s)   Other Topics Concern  . Not on file   Social History Narrative  . No narrative on file     Allergies  Allergen Reactions  . Other     Paper and adhesive tape - blisters  . Penicillins      Outpatient Prescriptions Prior to Visit  Medication Sig Dispense Refill  . baclofen (LIORESAL) 10 MG tablet 10 mg. Take 2 tablets in the morning, 2 tablets in the afternoon, 2 tablets at bedtime per bottle.      Marland Kitchen ibuprofen (ADVIL,MOTRIN) 100 MG/5ML suspension 200 mg every 4 (four) hours as needed. Per bottle. Via tube      . Nutritional Supplements (JEVITY) LIQD 5 cans daily with 8 ounces of water flush per G- tube after each can.      Marland Kitchen Phenylephrine-Guaifenesin (MUCINEX COLD/KIDS) 2.5-100 MG/5ML LIQD 20 ml per G-tube as needed for congestion.      . polyethylene glycol powder (MIRALAX)  powder Take 17 g by mouth daily.        . primidone (MYSOLINE) 50 MG tablet 50 mg. 4 tabs every morning, 3 tabs in the afternoon, 4 tabs at bedtime via g tube      . [name] (RISPERDAL) 0.5 MG tablet 1 tablet in the morning, 2 tablets in the evening via g tube      . Simethicone (GAS-X INFANT DROPS) 40 MG/0.6ML LIQD 3 (three) times daily. Via g tube      . acetylcysteine (MUCOMYST) 10 % nebulizer solution Take 4 mLs by nebulization every 4 (four) hours.        Marland Kitchen albuterol (PROVENTIL) (2.5 MG/3ML) 0.083% nebulizer solution Take 2.5 mg by nebulization every 6 (six) hours as needed.         Review of Systems    Objective:   Physical Exam Filed Vitals:   12/11/10 1700  BP: 112/84  Pulse: 101  Temp: 97.4 F (36.3 C)  TempSrc: Axillary  SpO2: 88%      General: Thin.  Nose: narrow nasal passages, clear drainage, no tenderness  Mouth: MP 2, no exudate  Neck: no JVD.  Lungs: no  wheeze or rales, scattered rhonchi  Heart: regular rhythm, normal rate, and no murmurs.  Abdomen: thin, soft, PEG site clean  Extremities: contracted  Neurologic: Awake, no verbal, moans intermittently  Cervical Nodes: no significant adenopathy  Assessment & Plan:   DYSPNEA Related to reactive airways, and difficulty with control of secretions.  She is maintaining herself on current regimen.  Her mother is clear about wishes for DNR/DNI status.   REACTIVE AIRWAY DISEASE She has done well with her combination of mucomyst and albuterol.  Hypoxemia She has a pulse oximeter and is placed on oxygen when her Sats are [name].   CEREBRAL PALSY F/U with Dr. Sharene Skeans.   Updated Medication List Outpatient Encounter Prescriptions as of 12/11/2010  Medication Sig Dispense Refill  . acetylcysteine (MUCOMYST) 10 % nebulizer solution 4 ml nebulized every four hours as needed  120 vial  6  . albuterol (PROVENTIL) (2.5 MG/3ML) 0.083% nebulizer solution One vial nebulized every four hours as needed  120 vial  6  .  baclofen (LIORESAL) 10 MG tablet 10 mg. Take 2 tablets in the morning, 2 tablets in the afternoon, 2 tablets at bedtime per bottle.      Marland Kitchen ibuprofen (ADVIL,MOTRIN) 100 MG/5ML suspension 200 mg every 4 (four) hours as needed. Per bottle. Via tube      . Nutritional Supplements (JEVITY) LIQD 5 cans daily with 8 ounces of water flush per G- tube after each can.      Marland Kitchen Phenylephrine-Guaifenesin (MUCINEX COLD/KIDS) 2.5-100 MG/5ML LIQD 20 ml per G-tube as needed for congestion.      . polyethylene glycol powder (MIRALAX) powder Take 17 g by mouth daily.        . primidone (MYSOLINE) 50 MG tablet 50 mg. 4 tabs every morning, 3 tabs in the afternoon, 4 tabs at bedtime via g tube      . [name] (RISPERDAL) 0.5 MG tablet 1 tablet in the morning, 2 tablets in the evening via g tube      . Simethicone (GAS-X INFANT DROPS) 40 MG/0.6ML LIQD 3 (three) times daily. Via g tube      . DISCONTD: acetylcysteine (MUCOMYST) 10 % nebulizer solution Take 4 mLs by nebulization every 4 (four) hours.        Marland Kitchen DISCONTD: albuterol (PROVENTIL) (2.5 MG/3ML) 0.083% nebulizer solution Take 2.5 mg by nebulization every 6 (six) hours as needed.

## 2010-12-15 ENCOUNTER — Telehealth: Payer: Self-pay | Admitting: Pulmonary Disease

## 2010-12-15 MED ORDER — AZITHROMYCIN 100 MG/5ML PO SUSR: ORAL | Status: DC

## 2010-12-15 NOTE — Telephone Encounter (Signed)
Can give 100mg /21ml azithromycin.  Use 10 ml once on day one, then 5 ml per day for additional 4 days.  Dispense 30 ml with no refills.

## 2010-12-15 NOTE — Telephone Encounter (Signed)
Spoke to pt's mother and she states Renee [name] is coughing, has low grade temp, she is also having to use some o2 due to pt struggling to breathe. Mother wants to know if dr Craige Cotta would give antibiotic since pt was just seen Monday, mother thinks pt has pneumonia.

## 2010-12-15 NOTE — Telephone Encounter (Signed)
Spoke w/ [facility] and advised them of rc for azithromycin 100mg /60ml and pt to take 10mg /kg once on day 1 and then 5mg /kg a day x 4 days after that. Pt weight which is 52 lbs. According to directions pt would take 236 mg on day 1 and then 118 mg starting day 2 x 3 days. Pharmacy states they want this verified by Dr. Craige Cotta before they fill this medication. Please advise Dr. Craige Cotta. Thanks  Carver Fila, CMA

## 2010-12-15 NOTE — Telephone Encounter (Signed)
Rx sent to pharmacy and pt mother is aware rx and directions. Nothing further was needed

## 2010-12-15 NOTE — Telephone Encounter (Signed)
Please send script for azithromycin 10mg /kg once on day one, followed by 5mg /kg daily for 4 days after this.  Please check with pt's mother about her weight.  Send script for oral powder for suspension 100mg /55ml.

## 2011-01-02 ENCOUNTER — Ambulatory Visit (INDEPENDENT_AMBULATORY_CARE_PROVIDER_SITE_OTHER): Payer: 59 | Admitting: Urology

## 2011-01-02 DIAGNOSIS — G809 Cerebral palsy, unspecified: Secondary | ICD-10-CM

## 2011-01-02 DIAGNOSIS — N390 Urinary tract infection, site not specified: Secondary | ICD-10-CM

## 2011-01-17 ENCOUNTER — Telehealth: Payer: Self-pay | Admitting: Pulmonary Disease

## 2011-01-17 MED ORDER — AZITHROMYCIN 100 MG/5ML PO SUSR: ORAL | Status: DC

## 2011-01-17 MED ORDER — PREDNISONE 5 MG/5ML PO SOLN: ORAL | Status: AC

## 2011-01-17 NOTE — Telephone Encounter (Signed)
Called, spoke with pt's mother.  States pt started having dry cough on Saturday but is getting worse.  She can hear the congestion but Gwenda cannot cough it up.  She has been giving her albuterol and mucomyst nebs every 4-6 hours and mucinex q6h x 3 days but still no relief.  Trinitee had low grade fever last night of 100.  She will bring Jocelyne in if this is what VS recs but there are not openings today and mom would like to know what she do for this.  Also, states pt had a few break thru seizures last night but states this is normal for Velecia.  Allergies verified.  [facility] pharmacy.  Mom aware VS out of office until after lunch and will forward message to him for recs.  Dr. Craige Cotta, pls advise.  Thanks!  Allergies  Allergen Reactions  . Other     Paper and adhesive tape - blisters  . Penicillins

## 2011-01-17 NOTE — Telephone Encounter (Signed)
Spoke with Carole's mother.  Dry cough from chest.  Using albuterol and mucomyst ever four hours.  More labored breathing.  Temp up to 100F.  No sinus congestion.  O2 down to mid 80's. Wheezing more.  She had improvement last month after course of zithromax.  Will send another script for zithromax and will give her course of prednisone.  Advised her to call if symptoms do not improve.

## 2011-01-26 ENCOUNTER — Ambulatory Visit (INDEPENDENT_AMBULATORY_CARE_PROVIDER_SITE_OTHER): Payer: 59 | Admitting: Urology

## 2011-01-26 DIAGNOSIS — N3000 Acute cystitis without hematuria: Secondary | ICD-10-CM

## 2011-01-26 DIAGNOSIS — R339 Retention of urine, unspecified: Secondary | ICD-10-CM

## 2011-02-06 ENCOUNTER — Ambulatory Visit (INDEPENDENT_AMBULATORY_CARE_PROVIDER_SITE_OTHER): Payer: 59 | Admitting: Urology

## 2011-02-06 DIAGNOSIS — R339 Retention of urine, unspecified: Secondary | ICD-10-CM

## 2011-02-06 DIAGNOSIS — G809 Cerebral palsy, unspecified: Secondary | ICD-10-CM

## 2011-02-06 DIAGNOSIS — N390 Urinary tract infection, site not specified: Secondary | ICD-10-CM

## 2011-04-17 ENCOUNTER — Encounter: Payer: Self-pay | Admitting: Pulmonary Disease

## 2011-04-17 ENCOUNTER — Ambulatory Visit (INDEPENDENT_AMBULATORY_CARE_PROVIDER_SITE_OTHER): Payer: 59 | Admitting: Pulmonary Disease

## 2011-04-17 DIAGNOSIS — J45909 Unspecified asthma, uncomplicated: Secondary | ICD-10-CM

## 2011-04-17 DIAGNOSIS — R0902 Hypoxemia: Secondary | ICD-10-CM

## 2011-04-17 DIAGNOSIS — R0602 Shortness of breath: Secondary | ICD-10-CM

## 2011-04-17 DIAGNOSIS — G809 Cerebral palsy, unspecified: Secondary | ICD-10-CM

## 2011-04-17 NOTE — Progress Notes (Signed)
Subjective:    Patient ID: Renee [name], female    DOB: 1985-11-18, 25 y.o.   MRN: [id number]  HPI CC: Rich Fuchs  HPI  25 yo female with dyspnea, hypoxemia, and reactive airways disease.  She has been doing well.  She gets albuterol 2 or 3 times per day for coughing spells, and this helps.  She has not needed to use mucomyst for a while.  She got her flu shot earlier this month.  Her oxygen level has been better.  Past Medical History  Diagnosis Date  . Cerebral palsy   . Mental retardation   . Hydrocephalus   . Seizure disorder   . Osteopenia   . Hip dysplasia     bilateral  . DNR (do not resuscitate)   . DNI (do not intubate)     History   Social History  . Marital Status: Single   Social History Main Topics  . Smoking status: Never Smoker    Allergies  Allergen Reactions  . Other     Paper and adhesive tape - blisters  . Penicillins     Review of Systems     Objective:   Physical Exam  BP 102/72  Pulse 104  Temp(Src) 98.6 F (37 C) (Oral)  Wt 52 lb (23.587 kg)  SpO2 92%  General: Thin.  Nose: narrow nasal passages, clear drainage, no tenderness  Mouth: MP 2, no exudate  Neck: no JVD.  Lungs: no wheeze or rales, scattered rhonchi  Heart: regular rhythm, normal rate, and no murmurs.  Abdomen: thin, soft, PEG site clean  Extremities: contracted  Neurologic: Awake, no verbal, moans intermittently  Cervical Nodes: no significant adenopathy    Assessment & Plan:   REACTIVE AIRWAY DISEASE She has done well with her albuterol as needed.  DYSPNEA Related to reactive airways, and difficulty with control of secretions.  She is maintaining herself on current regimen.  Her mother is clear about wishes for DNR/DNI status.  Hypoxemia She has a pulse oximeter and is placed on oxygen when her Sats are [name].  SpO2 improved today.  CEREBRAL PALSY F/U with Dr. Sharene Skeans.  Updated Medication List Outpatient Encounter Prescriptions as of  04/17/2011  Medication Sig Dispense Refill  . acetylcysteine (MUCOMYST) 10 % nebulizer solution 4 ml nebulized every four hours as needed  120 vial  6  . albuterol (PROVENTIL) (2.5 MG/3ML) 0.083% nebulizer solution One vial nebulized every four hours as needed  120 vial  6  . baclofen (LIORESAL) 10 MG tablet 10 mg. Take 2 tablets in the morning, 2 tablets in the afternoon, 2 tablets at bedtime per bottle.      Marland Kitchen ibuprofen (ADVIL,MOTRIN) 100 MG/5ML suspension 200 mg every 4 (four) hours as needed. Per bottle. Via tube      . Nutritional Supplements (JEVITY) LIQD 5 cans daily with 8 ounces of water flush per G- tube after each can.      Marland Kitchen Phenylephrine-Guaifenesin (MUCINEX COLD/KIDS) 2.5-100 MG/5ML LIQD 20 ml per G-tube as needed for congestion.      . polyethylene glycol powder (MIRALAX) powder Take 17 g by mouth daily.        . primidone (MYSOLINE) 50 MG tablet 50 mg. 4 tabs every morning, 3 tabs in the afternoon, 4 tabs at bedtime via g tube      . [name] (RISPERDAL) 0.5 MG tablet 1 tablet in the morning, 2 tablets in the evening via g tube      .  Simethicone (GAS-X INFANT DROPS) 40 MG/0.6ML LIQD 3 (three) times daily. Via g tube      . DISCONTD: azithromycin (ZITHROMAX) 100 MG/5ML suspension Use 10 mL once on day 1 and then 5 mL per day for additional 4 days  30 mL  0

## 2011-04-17 NOTE — Assessment & Plan Note (Signed)
She has a pulse oximeter and is placed on oxygen when her Sats are low.  SpO2 improved today.

## 2011-04-17 NOTE — Assessment & Plan Note (Signed)
Related to reactive airways, and difficulty with control of secretions.  She is maintaining herself on current regimen.  Her mother is clear about wishes for DNR/DNI status.

## 2011-04-17 NOTE — Assessment & Plan Note (Signed)
She has done well with her albuterol as needed.

## 2011-04-17 NOTE — Assessment & Plan Note (Signed)
F/U with Dr. Sharene Skeans.

## 2011-04-17 NOTE — Patient Instructions (Signed)
Follow up in 6 months 

## 2011-04-24 LAB — COMPREHENSIVE METABOLIC PANEL
AST: 39 — ABNORMAL HIGH
Albumin: 3.9
Alkaline Phosphatase: 82
BUN: 7
BUN: 9
CO2: 24
Calcium: 8.9
Chloride: 104
Creatinine, Ser: 0.32 — ABNORMAL LOW
Creatinine, Ser: 0.43
GFR calc Af Amer: >60
GFR calc non Af Amer: >60
Glucose, Bld: 98
Potassium: 3.9
Total Bilirubin: 0.5
Total Bilirubin: 0.5
Total Protein: 6

## 2011-04-24 LAB — CBC
HCT: 32.5 — ABNORMAL LOW
HCT: 35.8 — ABNORMAL LOW
HCT: 42.3
HCT: 44
Hemoglobin: 11.6 — ABNORMAL LOW
Hemoglobin: 12.1
Hemoglobin: 14.6
Hemoglobin: 15.1 — ABNORMAL HIGH
MCHC: 34.9
MCHC: 35.1
MCV: 101.4 — ABNORMAL HIGH
MCV: 98.4
Platelets: 103 — ABNORMAL LOW
Platelets: 105 — ABNORMAL LOW
Platelets: 90 — ABNORMAL LOW
Platelets: 92 — ABNORMAL LOW
Platelets: 95 — ABNORMAL LOW
RBC: 3.31 — ABNORMAL LOW
RBC: 4.37
RDW: 12.4
RDW: 12.5
RDW: 12.7
RDW: 12.7
WBC: 2.5 — ABNORMAL LOW
WBC: 2.5 — ABNORMAL LOW
WBC: 3.5 — ABNORMAL LOW
WBC: 3.5 — ABNORMAL LOW

## 2011-04-24 LAB — DIFFERENTIAL
Basophils Absolute: 0
Basophils Absolute: 0
Basophils Absolute: 0
Basophils Absolute: 0
Basophils Absolute: 0
Basophils Absolute: 0
Basophils Relative: 0
Basophils Relative: 0
Basophils Relative: 1
Eosinophils Absolute: 0
Eosinophils Relative: 0
Eosinophils Relative: 0
Eosinophils Relative: 0
Eosinophils Relative: 2
Lymphocytes Relative: 13
Lymphocytes Relative: 22
Lymphocytes Relative: 27
Lymphocytes Relative: 5 — ABNORMAL LOW
Lymphs Abs: 0.2 — ABNORMAL LOW
Lymphs Abs: 0.5 — ABNORMAL LOW
Lymphs Abs: 0.6 — ABNORMAL LOW
Monocytes Absolute: 0 — ABNORMAL LOW
Monocytes Absolute: 0.3
Monocytes Relative: 11
Neutro Abs: 0.9 — ABNORMAL LOW
Neutro Abs: 1.5 — ABNORMAL LOW
Neutro Abs: 1.7
Neutro Abs: 2
Neutro Abs: 3.2
Neutrophils Relative %: 50
Neutrophils Relative %: 73
Neutrophils Relative %: 81 — ABNORMAL HIGH
Neutrophils Relative %: 82 — ABNORMAL HIGH

## 2011-04-24 LAB — CULTURE, BLOOD (ROUTINE X 2)
Culture: NO GROWTH
Culture: NO GROWTH
Report Status: 10192009

## 2011-04-24 LAB — BASIC METABOLIC PANEL
BUN: 1 — ABNORMAL LOW
BUN: 1 — ABNORMAL LOW
BUN: 3 — ABNORMAL LOW
BUN: <1 — ABNORMAL LOW
CO2: 26
CO2: 27
Calcium: 7.9 — ABNORMAL LOW
Calcium: 8.2 — ABNORMAL LOW
Calcium: 8.4
Chloride: 103
Creatinine, Ser: 0.32 — ABNORMAL LOW
GFR calc non Af Amer: >60
Glucose, Bld: 101 — ABNORMAL HIGH
Glucose, Bld: 84
Glucose, Bld: 97
Potassium: 2.7 — CL
Potassium: 3.2 — ABNORMAL LOW
Sodium: 131 — ABNORMAL LOW
Sodium: 136
Sodium: 139

## 2011-04-24 LAB — URINE MICROSCOPIC-ADD ON

## 2011-04-24 LAB — BLOOD GAS, ARTERIAL
Acid-base deficit: 1.9
Bicarbonate: 22.1
O2 Saturation: 93.9
Patient temperature: 37
TCO2: 19.4
pH, Arterial: 7.409 — ABNORMAL HIGH

## 2011-04-24 LAB — URINALYSIS, ROUTINE W REFLEX MICROSCOPIC
Glucose, UA: NEGATIVE
Ketones, ur: 15 — AB
Leukocytes, UA: NEGATIVE
Nitrite: NEGATIVE
Specific Gravity, Urine: 1.01
pH: 7

## 2011-04-24 LAB — MAGNESIUM: Magnesium: 1.7

## 2011-04-24 LAB — URINE CULTURE: Culture: NO GROWTH

## 2011-04-24 LAB — PHOSPHORUS
Phosphorus: 0.5 — CL
Phosphorus: 1.8 — ABNORMAL LOW
Phosphorus: 2.5

## 2011-04-24 LAB — VITAMIN B12: Vitamin B-12: 848 (ref 211–911)

## 2011-04-24 LAB — PROTIME-INR
INR: 1.7 — ABNORMAL HIGH
Prothrombin Time: 20.3 — ABNORMAL HIGH

## 2011-04-24 LAB — CALCIUM: Calcium: 7.7 — ABNORMAL LOW

## 2011-04-24 LAB — FOLATE RBC: RBC Folate: 756 — ABNORMAL HIGH

## 2011-05-24 ENCOUNTER — Encounter: Payer: Self-pay | Admitting: Adult Health

## 2011-05-24 ENCOUNTER — Telehealth: Payer: Self-pay | Admitting: Pulmonary Disease

## 2011-05-24 ENCOUNTER — Ambulatory Visit (INDEPENDENT_AMBULATORY_CARE_PROVIDER_SITE_OTHER): Payer: 59 | Admitting: Adult Health

## 2011-05-24 VITALS — BP 104/68 | HR 98 | Temp 96.7°F

## 2011-05-24 DIAGNOSIS — J45909 Unspecified asthma, uncomplicated: Secondary | ICD-10-CM

## 2011-05-24 MED ORDER — MOXIFLOXACIN HCL 400 MG PO TABS: 400.0000 mg | ORAL_TABLET | Freq: Every day | ORAL | Status: AC

## 2011-05-24 NOTE — Patient Instructions (Addendum)
Avelox 400mg  daily for 7 days  Mucinex DM Twice daily  As needed  Cough/congestion  Continue with nebs  Please contact office for sooner follow up if symptoms do not improve or worsen or seek emergency care follow up  follow up Dr. Craige Cotta  As planned and As needed

## 2011-05-24 NOTE — Assessment & Plan Note (Signed)
Acute tracheobronchitis in pt that has difficulty with secretions  Will cover with broad spectrum abx since she had vomitting episode last night , she does not  Appear toxic (no fever or change in behavior) Encouraged on pulmonary hygiene  She does take adult dose meds per chart and mother , will give adult dose as in past she has tolerated.   Plan:  Avelox 400mg  daily for 7 days  Mucinex DM Twice daily  As needed  Cough/congestion  Continue with nebs  Please contact office for sooner follow up if symptoms do not improve or worsen or seek emergency care

## 2011-05-24 NOTE — Telephone Encounter (Signed)
Spoke with pt's mother. She states that pt is having worsening cough over the past several days, thick, gooey sputum tan in color. She states that her chest is also very congestion and cough so severe sometimes to the point she vomits. OV with TP sched for 11:30 am today.

## 2011-05-24 NOTE — Progress Notes (Signed)
  Subjective:    Patient ID: Renee [name], female    DOB: [birth year], 25 y.o.   MRN: [id number]  HPI 25 yo female with dyspnea, hypoxemia, and reactive airways disease.  05/24/2011 Acute OV  Pt is accompanied by her mother and aide. Mother pt has increased cough and congestion for 1 week with [name] grade fever, Increased chest congestion. Has mucus that is  very thick and causes vomtiting of very thick sticky mucus. She has Increased Mucomist to tid and Albuterol neb to tid or qid - mucinex bid to tid - Has one dose of Cipro left for UTI. Had UTI last week.   No hemoptysis or diarrhea. No constipation   Review of Systems Per mom- pt does not communicate  Constitutional:   No  weight loss, night sweats,    HEENT:   No headaches,  Difficulty swallowing,  Tooth/dental problems, or  Sore throat,                No sneezing, itching, ear ache, nasal congestion, post nasal drip,   CV:  No chest pain,  Orthopnea, PND, swelling in lower extremities, anasarca, dizziness, palpitations, syncope.   GI  No heartburn, indigestion, abdominal pain, nausea, vomiting, diarrhea, change in bowel habits, loss of appetite, bloody stools.   Resp: No shortness of breath with exertion or at rest.  ,    No coughing up of blood.  No chest wall deformity  Skin: no rash or lesions.  GU: no dysuria, change in color of urine, no urgency or frequency.  No flank pain, no hematuria   MS:  No joint pain or swelling.  No decreased range of motion.  No back pain.           Objective:   Physical Exam GEN: debilitated in power chair, frail very thin   HEENT:   NOSE-clear, THROAT-clear, no lesions, no postnasal drip or exudate noted.   NECK:   no thyromegaly or nodules palpated; no lymphadenopathy.  RESP  Coarse BS w/ few rhonchi no accessory muscle use, no dullness to percussion  CARD:  RRR, no m/r/g  , no peripheral edema, pulses intact, no cyanosis or clubbing.  GI:   Soft &; nml bowel sounds; no organomegaly or  masses detected, feeding tube.   Neuro: alert, awake, moans to mother     Skin: Warm, no lesions or rashes         Assessment & Plan:

## 2011-05-25 NOTE — Progress Notes (Signed)
Reviewed and agree with plan.

## 2011-07-03 ENCOUNTER — Ambulatory Visit (INDEPENDENT_AMBULATORY_CARE_PROVIDER_SITE_OTHER): Payer: 59 | Admitting: Urology

## 2011-07-03 DIAGNOSIS — N390 Urinary tract infection, site not specified: Secondary | ICD-10-CM

## 2011-07-03 DIAGNOSIS — R339 Retention of urine, unspecified: Secondary | ICD-10-CM

## 2011-07-03 DIAGNOSIS — G809 Cerebral palsy, unspecified: Secondary | ICD-10-CM

## 2011-10-17 ENCOUNTER — Encounter: Payer: Self-pay | Admitting: Pulmonary Disease

## 2011-10-17 ENCOUNTER — Ambulatory Visit (INDEPENDENT_AMBULATORY_CARE_PROVIDER_SITE_OTHER): Payer: 59 | Admitting: Pulmonary Disease

## 2011-10-17 VITALS — BP 100/78 | HR 103 | Temp 97.5°F | Wt <= 1120 oz

## 2011-10-17 DIAGNOSIS — R1110 Vomiting, unspecified: Secondary | ICD-10-CM

## 2011-10-17 DIAGNOSIS — R0902 Hypoxemia: Secondary | ICD-10-CM

## 2011-10-17 DIAGNOSIS — J45909 Unspecified asthma, uncomplicated: Secondary | ICD-10-CM

## 2011-10-17 MED ORDER — ALBUTEROL SULFATE (2.5 MG/3ML) 0.083% IN NEBU: INHALATION_SOLUTION | RESPIRATORY_TRACT | Status: DC

## 2011-10-17 MED ORDER — BUDESONIDE 0.25 MG/2ML IN SUSP: 0.2500 mg | Freq: Two times a day (BID) | RESPIRATORY_TRACT | Status: DC

## 2011-10-17 NOTE — Patient Instructions (Signed)
Budesonide (pulmicort) one vial nebulized twice per day, and rinse mouth after each use Follow up in 6 months

## 2011-10-17 NOTE — Progress Notes (Signed)
Chief Complaint  Patient presents with  . Follow-up    C/o mostly dry cough but occasionally spits up very thick w/ clear to yellow tint phlem, wheezing, breathing is okay   CC: Renee [name], Renee [name]   History of Present Illness: Renee [name] is a 26 y.o. female with dyspnea, hypoxemia, and reactive airways disease in setting of cerebral palsy.  She is DNR/DNI.  She was treated for a bronchitis in November.    She gets episodes of cough with thick sputum.  She will then get wheezing.  Her mother will give an albuterol treatment, and then she improves after about 10 to 15 minutes.  This happens several times per week.  She is not having fever.  She is maintaining her oxygen level.  She has been getting episodes of vomiting about twice per week over the past few weeks.  Her mother is worried that something is kinking her PEG tube.   Past Medical History  Diagnosis Date  . Cerebral palsy   . Mental retardation   . Hydrocephalus   . Seizure disorder   . Osteopenia   . Hip dysplasia     bilateral  . DNR (do not resuscitate)   . DNI (do not intubate)     No past surgical history on file.  Allergies  Allergen Reactions  . Other     Paper and adhesive tape - blisters  . Penicillins     Physical Exam:  Blood pressure 100/78, pulse 103, temperature 97.5 F (36.4 C), temperature source Oral, weight 50 lb (22.68 kg), SpO2 92.00%. There is no height on file to calculate BMI. Wt Readings from Last 2 Encounters:  10/17/11 50 lb (22.68 kg)  04/17/11 52 lb (23.587 kg)    General - Thin HEENT - no sinus tenderness, no nasal discharge, no oral exudate Cardiac - s1s2 regular, no murmur Chest - no wheeze/rales Abdomen - soft, PEG tube in place Extremities - no edema, contracted Neurologic - awake, non-verbal, moans intermittently   Assessment/Plan:  Outpatient Encounter Prescriptions as of 10/17/2011  Medication Sig Dispense Refill  . acetylcysteine (MUCOMYST) 10 %  nebulizer solution 4 ml nebulized every four hours as needed  120 vial  6  . albuterol (PROVENTIL) (2.5 MG/3ML) 0.083% nebulizer solution One vial nebulized every four hours as needed  120 vial  6  . baclofen (LIORESAL) 10 MG tablet 10 mg. Take 2 tablets in the morning, 2 tablets in the afternoon, 1 tablet mid evening 2 tablets at bedtime per bottle.      Marland Kitchen ibuprofen (ADVIL,MOTRIN) 100 MG/5ML suspension 200 mg every 4 (four) hours as needed. Per bottle. Via tube      . Nutritional Supplements (JEVITY) LIQD 5 cans daily with 8 ounces of water flush per G- tube after each can.      Marland Kitchen Phenylephrine-Guaifenesin (MUCINEX COLD/KIDS) 2.5-100 MG/5ML LIQD 20 ml per G-tube as needed for congestion.      . polyethylene glycol powder (MIRALAX) powder Take 17 g by mouth daily as needed.       . primidone (MYSOLINE) 50 MG tablet 50 mg. 4 tabs every morning, 3 tabs in the afternoon, 4 tabs at bedtime via g tube      . [name] (RISPERDAL) 0.5 MG tablet 1 tablet in the morning, 2 tablets in the evening via g tube      . Simethicone (GAS-X INFANT DROPS) 40 MG/0.6ML LIQD 3 (three) times daily. Via g tube  Pandora Mccrackin Pager:  [phone] 10/17/2011, 10:01 AM

## 2011-10-17 NOTE — Assessment & Plan Note (Signed)
She is going to get her PEG tube checked.

## 2011-10-17 NOTE — Assessment & Plan Note (Signed)
She has increased cough, sputum, and wheeze.  Will add nebulized pulmicort to her regimen.  She is to continue albuterol and mucomyst as needed.

## 2011-10-17 NOTE — Assessment & Plan Note (Signed)
She has a pulse oximeter and is placed on oxygen when her Sats are low.  

## 2011-10-29 ENCOUNTER — Telehealth: Payer: Self-pay | Admitting: Pulmonary Disease

## 2011-10-29 MED ORDER — MOXIFLOXACIN HCL 400 MG PO TABS: 400.0000 mg | ORAL_TABLET | Freq: Every day | ORAL | Status: AC

## 2011-10-29 NOTE — Telephone Encounter (Signed)
rx sent. Pt mother is aware of recs.Carron Curie, CMA

## 2011-10-29 NOTE — Telephone Encounter (Signed)
Please send presciption for Avelox 400 mg once daily per tube for 7 days.  Advise her to call back if now improvement.

## 2011-10-29 NOTE — Telephone Encounter (Signed)
Spoke with pt's mother.  She reports that pt is having increased secretions that are now yellow in color, increased wheezing, rattling in chest.  Pt's mother is doing chest PT.  She is requesting an abx before pt gets any worse.  Please advise.

## 2011-11-29 ENCOUNTER — Other Ambulatory Visit: Payer: Self-pay | Admitting: Urology

## 2011-11-29 DIAGNOSIS — R339 Retention of urine, unspecified: Secondary | ICD-10-CM

## 2012-01-04 ENCOUNTER — Ambulatory Visit (HOSPITAL_COMMUNITY)
Admission: RE | Admit: 2012-01-04 | Discharge: 2012-01-04 | Disposition: A | Discharge status: Home / Self Care | Payer: 59 | Source: Ambulatory Visit | Attending: Urology | Admitting: Urology

## 2012-01-04 DIAGNOSIS — R339 Retention of urine, unspecified: Secondary | ICD-10-CM | POA: Insufficient documentation

## 2012-01-08 ENCOUNTER — Ambulatory Visit (INDEPENDENT_AMBULATORY_CARE_PROVIDER_SITE_OTHER): Payer: 59 | Admitting: Urology

## 2012-01-08 DIAGNOSIS — R339 Retention of urine, unspecified: Secondary | ICD-10-CM

## 2012-01-08 DIAGNOSIS — N390 Urinary tract infection, site not specified: Secondary | ICD-10-CM

## 2012-01-08 DIAGNOSIS — G809 Cerebral palsy, unspecified: Secondary | ICD-10-CM

## 2012-02-22 ENCOUNTER — Other Ambulatory Visit: Payer: Self-pay | Admitting: Pulmonary Disease

## 2012-02-25 ENCOUNTER — Telehealth: Payer: Self-pay | Admitting: Pulmonary Disease

## 2012-02-25 NOTE — Telephone Encounter (Signed)
No real alternative to mucomyst.  Could try either scopalamine patch or atrovent sublingual drops to assist with decreasing respiratory secretions>>mother has been reluctant to do this before.  Let me know if she would like to try these, and can send order to pharmacy.  Otherwise, continue albuterol on more regular basis and pulmicort bid.

## 2012-02-25 NOTE — Telephone Encounter (Signed)
I spoke with Renee [name] and is aware of VS recs. She states she will think about it and will call and let us know what she decides.

## 2012-02-25 NOTE — Telephone Encounter (Signed)
I spoke with pt mother Renee [name] and she states the mucomyst is no longer available. She is requesting an alternative. She states the pharmacy advised her to call VS for alternatives. [name] would like something called in today if possible. Please advise Dr. Craige Cotta, thanks   Allergies  Allergen Reactions  . Other     Paper and adhesive tape - blisters  . Penicillins

## 2012-04-14 ENCOUNTER — Ambulatory Visit (INDEPENDENT_AMBULATORY_CARE_PROVIDER_SITE_OTHER): Payer: 59 | Admitting: Pulmonary Disease

## 2012-04-14 ENCOUNTER — Encounter: Payer: Self-pay | Admitting: Pulmonary Disease

## 2012-04-14 VITALS — BP 110/62 | HR 130 | Temp 97.9°F | Ht 60.0 in | Wt <= 1120 oz

## 2012-04-14 DIAGNOSIS — J45909 Unspecified asthma, uncomplicated: Secondary | ICD-10-CM

## 2012-04-14 DIAGNOSIS — Z23 Encounter for immunization: Secondary | ICD-10-CM

## 2012-04-14 DIAGNOSIS — R0902 Hypoxemia: Secondary | ICD-10-CM

## 2012-04-14 MED ORDER — BUDESONIDE 0.25 MG/2ML IN SUSP: 0.2500 mg | Freq: Two times a day (BID) | RESPIRATORY_TRACT | Status: DC

## 2012-04-14 MED ORDER — ALBUTEROL SULFATE (2.5 MG/3ML) 0.083% IN NEBU: INHALATION_SOLUTION | RESPIRATORY_TRACT | Status: DC

## 2012-04-14 NOTE — Progress Notes (Signed)
Chief Complaint  Patient presents with  . Follow-up    Pts mother reports patient has been doing pretty good, no changes in breathing--denies any other symptoms--req flu shot  . Medication Refill    albuterol and pulmicort nebs   CC: Renee [name], Renee [name]   History of Present Illness: Renee [name] is a 26 y.o. female with dyspnea, hypoxemia, and reactive airways disease in setting of cerebral palsy.  She is DNR/DNI.  She has intermittent cough, but has been doing better since addition of pulmicort.  She uses albuterol 2 to 4 times per day.  She was started on levaquin for a UTI.   Past Medical History  Diagnosis Date  . Cerebral palsy   . Mental retardation   . Hydrocephalus   . Seizure disorder   . Osteopenia   . Hip dysplasia     bilateral  . DNR (do not resuscitate)   . DNI (do not intubate)     No past surgical history on file.  Allergies  Allergen Reactions  . Other     Paper and adhesive tape - blisters  . Penicillins     Physical Exam:  Blood pressure 110/62, pulse 130, temperature 97.9 F (36.6 C), temperature source Axillary, height 5' (1.524 m), weight 48 lb (21.773 kg), SpO2 92.00%.  Body mass index is 9.37 kg/(m^2). Wt Readings from Last 2 Encounters:  04/14/12 48 lb (21.773 kg)  10/17/11 50 lb (22.68 kg)    General - Thin HEENT - no sinus tenderness, no nasal discharge, no oral exudate Cardiac - s1s2 regular, no murmur Chest - no wheeze/rales Abdomen - soft, PEG tube in place Extremities - no edema, contracted Neurologic - awake, non-verbal, moans intermittently   Assessment/Plan:  Outpatient Encounter Prescriptions as of 04/14/2012  Medication Sig Dispense Refill  . albuterol (PROVENTIL) (2.5 MG/3ML) 0.083% nebulizer solution One vial nebulized every four hours as needed  120 vial  6  . baclofen (LIORESAL) 10 MG tablet 10 mg. Take 2 tablets in the morning, 2 tablets in the afternoon, 1 tablet mid evening 2 tablets at bedtime per  bottle.      Marland Kitchen ibuprofen (ADVIL,MOTRIN) 100 MG/5ML suspension 200 mg every 4 (four) hours as needed. Per bottle. Via tube      . Nutritional Supplements (JEVITY) LIQD 5 cans daily with 8 ounces of water flush per G- tube after each can.      Marland Kitchen Phenylephrine-Guaifenesin (MUCINEX COLD/KIDS) 2.5-100 MG/5ML LIQD 20 ml per G-tube as needed for congestion.      . polyethylene glycol powder (MIRALAX) powder Take 17 g by mouth daily as needed.       . primidone (MYSOLINE) 50 MG tablet 50 mg. 4 tabs every morning, 3 tabs in the afternoon, 4 tabs at bedtime via g tube      . PULMICORT 0.25 MG/2ML nebulizer solution USE 1 VIAL PER NEBULIZER TWICE DAILY.  60 mL  2  . [name] (RISPERDAL) 0.5 MG tablet 1 tablet in the morning, 2 tablets in the evening via g tube      . Simethicone (GAS-X INFANT DROPS) 40 MG/0.6ML LIQD 3 (three) times daily. Via g tube      . MUCOMYST 20 % nebulizer solution USE 2 MLS IN NEBULIZER EVERY FOUR HOURS AS NEEDED.  60 mL  2    Chessa Barrasso Pager:  [phone] 04/14/2012, 3:22 PM

## 2012-04-14 NOTE — Patient Instructions (Signed)
Flu shot today Follow up in 6 months 

## 2012-04-14 NOTE — Assessment & Plan Note (Signed)
She has a pulse oximeter and is placed on oxygen when her Sats are low.  

## 2012-04-14 NOTE — Assessment & Plan Note (Signed)
Stable.  She is to continue pulmicort and albuterol.  Advised her to check with her pharmacy for mucomyst availability.

## 2012-06-10 ENCOUNTER — Ambulatory Visit (INDEPENDENT_AMBULATORY_CARE_PROVIDER_SITE_OTHER): Payer: 59 | Admitting: Urology

## 2012-06-10 DIAGNOSIS — N390 Urinary tract infection, site not specified: Secondary | ICD-10-CM

## 2012-06-12 ENCOUNTER — Telehealth: Payer: Self-pay | Admitting: Pulmonary Disease

## 2012-06-12 MED ORDER — ACETYLCYSTEINE 20 % IN SOLN: 2.0000 mL | RESPIRATORY_TRACT | Status: DC | PRN

## 2012-06-12 NOTE — Telephone Encounter (Signed)
Called and spoke with pts daughter and she stated that the [facility] in [place] will be able to get this medication for her mother.  Requesting an rx be sent to this pharmacy.  Refills have been sent in and nothing further is needed.

## 2012-08-04 ENCOUNTER — Telehealth: Payer: Self-pay | Admitting: Pulmonary Disease

## 2012-08-04 MED ORDER — LEVOFLOXACIN 25 MG/ML PO SOLN: 500.0000 mg | Freq: Every day | ORAL | Status: DC

## 2012-08-04 NOTE — Telephone Encounter (Signed)
Spoke with mother made aware of Dr Silverio Lay Rec. Verbally understood nothing further needed.

## 2012-08-04 NOTE — Telephone Encounter (Signed)
Please inform her mother that I have sent prescription for levaquin.  She is to use 500 mg (20 mL) through feeding tube daily for 7 days.  She is to call in few days if not improvement.

## 2012-08-04 NOTE — Telephone Encounter (Signed)
Pt's mother and caregiver states the pt had a few episodes of vomiting last week and aspirated. Since then she has had increased cough and congestion and the sputum is yellow in color. She has also had low grade fever off and on. Pt's mother has increased the Mucomyst and Albuterol Nebs to 4 or 5 times a day but has not seen much change. She does have trouble getting the pt to the office and would like recs from VS and possibly have something called in to the pharmacy. VS, pls advise. Allergies  Allergen Reactions  . Other     Paper and adhesive tape - blisters  . Penicillins

## 2012-08-08 ENCOUNTER — Telehealth: Payer: Self-pay | Admitting: Internal Medicine

## 2012-08-08 NOTE — Telephone Encounter (Signed)
Lm for pt to call

## 2012-08-08 NOTE — Telephone Encounter (Signed)
No, but will ask patient's caregiver to get records to [place] and we will see her in the office first (per Dr Juanda Chance request)

## 2012-08-08 NOTE — Telephone Encounter (Signed)
Mother called back, she will get GI records for PEG.

## 2012-08-08 NOTE — Telephone Encounter (Signed)
OK, I will accept her. Is there a report of last PEG change?

## 2012-08-11 ENCOUNTER — Telehealth: Payer: Self-pay | Admitting: Pulmonary Disease

## 2012-08-11 MED ORDER — LEVOFLOXACIN 25 MG/ML PO SOLN: 500.0000 mg | Freq: Every day | ORAL | Status: DC

## 2012-08-11 NOTE — Telephone Encounter (Signed)
Ok for 3 more days only

## 2012-08-11 NOTE — Telephone Encounter (Signed)
Called, spoke with [facility].  Informed her ok per MW to call in an extension on Levaquin for 3 more days.  She is aware this has been sent to [facility] and to call back if symptoms do not improve or worsen.  Renee [name] verbalized understanding of instructions and voiced no further questions or concerns at this time.

## 2012-08-11 NOTE — Telephone Encounter (Signed)
Called, spoke with pt's mom, [name].  Pt was given levaquin last week by Dr. Craige Cotta per phone msg:  Coralyn Helling, MD 08/04/2012 9:07 AM Signed  Please inform her mother that I have sent prescription for levaquin.  She is to use 500 mg (20 mL) through feeding tube daily for 7 days. She is to call in few days if not improvement.  ------  [name] states pt has improved but still has a low grade fever; this am it was 99.5, chest congestion, cough with yellowish mucus, and increased SOB at times (breathing tx helps).  Was using mucomyst and albuterol nebs q4h -- now using q6h.  Pt's last dose of levaquin is today.  [name] thinks pt could benefit from an extension on levaquin.  Pt is in a wheelchair.  Would like extension called in if possible.  As Dr. Craige Cotta is off, will route msg to Dr. Sherene Sires,  Dr. Sherene Sires, pls advise.  Thank you.  [facility]  Allergies verified with [facility]: Allergies  Allergen Reactions  . Other     Paper and adhesive tape - blisters  . Penicillins

## 2012-09-20 ENCOUNTER — Other Ambulatory Visit: Payer: Self-pay | Admitting: Pulmonary Disease

## 2012-10-16 ENCOUNTER — Ambulatory Visit: Payer: 59 | Admitting: Pulmonary Disease

## 2012-10-17 ENCOUNTER — Encounter: Payer: Self-pay | Admitting: Pulmonary Disease

## 2012-10-17 ENCOUNTER — Ambulatory Visit (INDEPENDENT_AMBULATORY_CARE_PROVIDER_SITE_OTHER): Payer: 59 | Admitting: Pulmonary Disease

## 2012-10-17 VITALS — BP 118/80 | HR 136 | Temp 97.1°F | Ht 60.0 in | Wt <= 1120 oz

## 2012-10-17 DIAGNOSIS — J4530 Mild persistent asthma, uncomplicated: Secondary | ICD-10-CM

## 2012-10-17 DIAGNOSIS — J45909 Unspecified asthma, uncomplicated: Secondary | ICD-10-CM

## 2012-10-17 DIAGNOSIS — R0902 Hypoxemia: Secondary | ICD-10-CM

## 2012-10-17 NOTE — Progress Notes (Signed)
Chief Complaint  Patient presents with  . Asthma    Breathing is unchanged. Mother reports SOB, wheezing and coughing with production of yellow mucus upon suction.     CC: Renee [name], Renee [name]   History of Present Illness: Renee [name] is a 27 y.o. female with dyspnea, hypoxemia, and reactive airways disease in setting of cerebral palsy.  She is DNR/DNI.  She was treated for a bronchitis in January with levaquin.  She has been doing better since. She gets intermittent episodes of cough and congestion.  Her mother gives her pulmicort and albuterol bid.  She uses mucomyst about once per week.  She has not been able to tolerate using oxygen at night >> she doesn't like wearing this on her face.  Renee [name]  has a past medical history of Cerebral palsy; Mental retardation; Hydrocephalus; Seizure disorder; Osteopenia; Hip dysplasia; DNR (do not resuscitate); and DNI (do not intubate).  Renee [name]  has no past surgical history on file.  Prior to Admission medications   Medication Sig Start Date End Date Taking? Authorizing Provider  acetylcysteine (MUCOMYST) 20 % nebulizer solution Take 2 mLs by nebulization every 4 (four) hours as needed. 06/12/12  Yes Coralyn Helling, MD  albuterol (PROVENTIL) (2.5 MG/3ML) 0.083% nebulizer solution One vial nebulized every four hours as needed 04/14/12 04/14/13 Yes Coralyn Helling, MD  baclofen (LIORESAL) 10 MG tablet 10 mg. Take 2 tablets in the morning, 2 tablets in the afternoon, 1 tablet mid evening 2 tablets at bedtime per bottle.   Yes Historical Provider, MD  ibuprofen (ADVIL,MOTRIN) 100 MG/5ML suspension 200 mg every 4 (four) hours as needed. Per bottle. Via tube   Yes Historical Provider, MD  levofloxacin (LEVAQUIN) 25 MG/ML solution Take 20 mLs (500 mg total) by mouth daily. X 3 additional days 08/11/12  Yes Nyoka Cowden, MD  Nutritional Supplements (JEVITY) LIQD 5 cans daily with 8 ounces of water flush per G- tube after each can.   Yes Historical  Provider, MD  Phenylephrine-Guaifenesin (MUCINEX COLD/KIDS) 2.5-100 MG/5ML LIQD 20 ml per G-tube as needed for congestion.   Yes Historical Provider, MD  polyethylene glycol powder (MIRALAX) powder Take 17 g by mouth daily as needed.    Yes Historical Provider, MD  primidone (MYSOLINE) 50 MG tablet 50 mg. 4 tabs every morning, 3 tabs in the afternoon, 4 tabs at bedtime via g tube   Yes Historical Provider, MD  PULMICORT 0.25 MG/2ML nebulizer solution USE 1 VIAL PER NEBULIZER TWICE DAILY. 09/20/12  Yes Coralyn Helling, MD  [name] (RISPERDAL) 0.5 MG tablet 1 tablet in the morning, 2 tablets in the evening via g tube   Yes Historical Provider, MD  Simethicone (GAS-X INFANT DROPS) 40 MG/0.6ML LIQD 3 (three) times daily. Via g tube   Yes Historical Provider, MD    Allergies  Allergen Reactions  . Other     Paper and adhesive tape - blisters  . Penicillins      Physical Exam:  General - Thin HEENT - no sinus tenderness, no nasal discharge, no oral exudate Cardiac - s1s2 regular, no murmur Chest - no wheeze/rales Abdomen - soft, PEG tube in place Extremities - no edema, contracted Neurologic - awake, non-verbal, moans intermittently  Assessment/Plan:  Coralyn Helling, MD [facility] Pager:  [phone] 10/17/2012, 2:30 PM

## 2012-10-17 NOTE — Patient Instructions (Signed)
Follow up in 6 months 

## 2012-10-17 NOTE — Assessment & Plan Note (Signed)
She is maintained on pulmicort and albuterol with prn mucomyst.

## 2012-10-17 NOTE — Assessment & Plan Note (Signed)
Discussed possible benefit of nocturnal oxygen use.  She may also be having episodes of sleep apnea.  Her mother will see if she can tolerate using oxygen at night.

## 2012-12-01 ENCOUNTER — Other Ambulatory Visit: Payer: Self-pay | Admitting: Family

## 2012-12-01 DIAGNOSIS — R454 Irritability and anger: Secondary | ICD-10-CM

## 2012-12-01 DIAGNOSIS — IMO0002 Reserved for concepts with insufficient information to code with codable children: Secondary | ICD-10-CM

## 2012-12-01 MED ORDER — RISPERIDONE 0.5 MG PO TABS: ORAL_TABLET | ORAL | Status: DC

## 2013-01-02 ENCOUNTER — Encounter: Payer: Self-pay | Admitting: Family

## 2013-01-02 DIAGNOSIS — F72 Severe intellectual disabilities: Secondary | ICD-10-CM | POA: Insufficient documentation

## 2013-01-02 DIAGNOSIS — J45909 Unspecified asthma, uncomplicated: Secondary | ICD-10-CM | POA: Insufficient documentation

## 2013-01-02 DIAGNOSIS — G253 Myoclonus: Secondary | ICD-10-CM | POA: Insufficient documentation

## 2013-01-02 DIAGNOSIS — R454 Irritability and anger: Secondary | ICD-10-CM

## 2013-01-02 DIAGNOSIS — IMO0002 Reserved for concepts with insufficient information to code with codable children: Secondary | ICD-10-CM | POA: Insufficient documentation

## 2013-01-02 DIAGNOSIS — Z982 Presence of cerebrospinal fluid drainage device: Secondary | ICD-10-CM | POA: Insufficient documentation

## 2013-01-02 DIAGNOSIS — Q043 Other reduction deformities of brain: Secondary | ICD-10-CM | POA: Insufficient documentation

## 2013-01-02 DIAGNOSIS — K5900 Constipation, unspecified: Secondary | ICD-10-CM | POA: Insufficient documentation

## 2013-01-02 DIAGNOSIS — G808 Other cerebral palsy: Secondary | ICD-10-CM | POA: Insufficient documentation

## 2013-01-02 DIAGNOSIS — Z8719 Personal history of other diseases of the digestive system: Secondary | ICD-10-CM

## 2013-01-02 DIAGNOSIS — G40319 Generalized idiopathic epilepsy and epileptic syndromes, intractable, without status epilepticus: Secondary | ICD-10-CM

## 2013-01-02 DIAGNOSIS — G910 Communicating hydrocephalus: Secondary | ICD-10-CM

## 2013-01-05 ENCOUNTER — Encounter: Payer: Self-pay | Admitting: Family

## 2013-01-05 ENCOUNTER — Ambulatory Visit (INDEPENDENT_AMBULATORY_CARE_PROVIDER_SITE_OTHER): Payer: 59 | Admitting: Family

## 2013-01-05 VITALS — BP 94/60 | HR 84 | Resp 18

## 2013-01-05 DIAGNOSIS — K5900 Constipation, unspecified: Secondary | ICD-10-CM

## 2013-01-05 DIAGNOSIS — G910 Communicating hydrocephalus: Secondary | ICD-10-CM

## 2013-01-05 DIAGNOSIS — IMO0002 Reserved for concepts with insufficient information to code with codable children: Secondary | ICD-10-CM

## 2013-01-05 DIAGNOSIS — F72 Severe intellectual disabilities: Secondary | ICD-10-CM

## 2013-01-05 DIAGNOSIS — G809 Cerebral palsy, unspecified: Secondary | ICD-10-CM

## 2013-01-05 DIAGNOSIS — G253 Myoclonus: Secondary | ICD-10-CM

## 2013-01-05 DIAGNOSIS — G808 Other cerebral palsy: Secondary | ICD-10-CM

## 2013-01-05 DIAGNOSIS — J45909 Unspecified asthma, uncomplicated: Secondary | ICD-10-CM

## 2013-01-05 DIAGNOSIS — Z982 Presence of cerebrospinal fluid drainage device: Secondary | ICD-10-CM

## 2013-01-05 DIAGNOSIS — Q043 Other reduction deformities of brain: Secondary | ICD-10-CM

## 2013-01-05 DIAGNOSIS — R454 Irritability and anger: Secondary | ICD-10-CM

## 2013-01-05 DIAGNOSIS — G40319 Generalized idiopathic epilepsy and epileptic syndromes, intractable, without status epilepticus: Secondary | ICD-10-CM

## 2013-01-05 MED ORDER — TIZANIDINE HCL 2 MG PO TABS: ORAL_TABLET | ORAL | Status: DC

## 2013-01-05 MED ORDER — PRIMIDONE 50 MG PO TABS: ORAL_TABLET | ORAL | Status: DC

## 2013-01-05 MED ORDER — RISPERIDONE 0.5 MG PO TABS: ORAL_TABLET | ORAL | Status: DC

## 2013-01-05 MED ORDER — BACLOFEN 10 MG PO TABS: ORAL_TABLET | ORAL | Status: DC

## 2013-01-05 NOTE — Patient Instructions (Addendum)
Continue giving Rajanae's medications without change. I have sent the refills to the pharmacy. Call me her irritability increases or she has changes in her condition. I will see Kerrin in follow up in 6 months or sooner if needed.

## 2013-01-05 NOTE — Progress Notes (Signed)
Patient: Renee [name] MRN: [id number] Sex: female DOB: July 11, 1986  Provider: Elveria Rising, NP Location of Care: [facility]  Note type: Routine return visit  History of Present Illness: Referral Source: Dr. Corrie Mckusick History from: Mother Chief Complaint: Seizure/Severe Mental Retardation  Renee [name] is a 27 y.o. 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  Renee [name] has minor motor seizures that have reduced in frequency to a few times per week.  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 The patient is wheelchair bound and requires total care.  She requires nebulizer treatments and intermittent oxygen at home. She has difficulties managing respiratory secretions and sees a pulmonologist regularly.     Renee [name] has episodes of irritability and crying. Tizanidine, Baclofen and [name] have worked to reduce her irritability. Sometimes Renee [name] becomes irritable when she has urinary tract infections. She voids very little spontaneously. Her mother performs in and out cath twice per day and says that she receives malodorous urine and pus with the catheterizations.  Mom reports that Renee [name] did well over the winter. She only had to receive antibiotics once to Mom's memory for a respiratory infection and did not require any visits to the hospital.   Since she was last seen, Renee [name] has [facility] for her pressures sores. She has been provided with an air mattress at home and she receives regular treatments to her bony pressure areas that are prone to skin breakdown.   Renee [name] is fairly calm today and looks quiet well.    Review of Systems: 12 system review was remarkable for short of breath, cough, difficulty swallowing and seizure.  Past Medical History   Diagnosis Date  . Cerebral palsy   . Mental retardation   . Hydrocephalus   . Seizure disorder   . Osteopenia   . Hip dysplasia     bilateral  . DNR (do not resuscitate)   . DNI (do not intubate)    Hospitalizations: yes, Head Injury: no, Nervous System Infections: no, Immunizations up to date: yes Past Medical History Comments: Patient was hospitalized due to pneumonia in 2012. She had complications including acute renal failure and pseudo-obstruction of the bowel. Renee [name] was also hospitalized for pneumonia in May, 2010 and October, 2009. She was on a ventilator during the May, 2010 hospitalization. She was gravely ill at that time and end of life discussions occurred. Her mother signed a MOST directive, which is still in force.   Surgical History Past Surgical History  Procedure Laterality Date  . Spinal fusion  2005    Family History Her grandmother had breast cancer. Family History is negative migraines, seizures, cognitive impairment, blindness, deafness, birth defects, chromosomal disorder, autism.  Social History History   Social History  . Marital Status: Single    Spouse Name: N/A    Number of Children: N/A  . Years of Education: N/A   Social History Main Topics  . Smoking status: Never Smoker   . Smokeless tobacco: Never Used  . Alcohol Use: No  . Drug Use: No  . Sexually Active: No   Other Topics Concern  . None   Social History Narrative   Shanira requires total care in all aspects of daily living. She lives with her mother, who receives some help from a home health aide for  Renee [name].     Current Outpatient Prescriptions on File Prior to Visit  Medication Sig Dispense Refill  . acetylcysteine (MUCOMYST) 20 % nebulizer solution Take 2 mLs by nebulization every 4 (four) hours as needed.  60 mL  6  . albuterol (PROVENTIL) (2.5 MG/3ML) 0.083% nebulizer solution One vial nebulized every four hours as needed  120 vial  6  . baclofen (LIORESAL) 10 MG tablet 10 mg. Take  2 tablets in the morning, 2 tablets in the afternoon, 1 tablet mid evening 2 tablets at bedtime per bottle.      Marland Kitchen ibuprofen (ADVIL,MOTRIN) 100 MG/5ML suspension 200 mg every 4 (four) hours as needed. Per bottle. Via tube      . Nutritional Supplements (JEVITY) LIQD 5 cans daily with 8 ounces of water flush per G- tube after each can.      Marland Kitchen Phenylephrine-Guaifenesin (MUCINEX COLD/KIDS) 2.5-100 MG/5ML LIQD 20 ml per G-tube as needed for congestion.      . polyethylene glycol powder (MIRALAX) powder Take 17 g by mouth daily as needed.       . primidone (MYSOLINE) 50 MG tablet 50 mg. 4 tabs every morning, 3 tabs in the afternoon, 4 tabs at bedtime via g tube      . PULMICORT 0.25 MG/2ML nebulizer solution USE 1 VIAL PER NEBULIZER TWICE DAILY.  120 mL  5  . [name] (RISPERDAL) 0.5 MG tablet 1 tablet in the morning, 2 tablets in the evening via g tube  90 tablet  1  . Simethicone (GAS-X INFANT DROPS) 40 MG/0.6ML LIQD 3 (three) times daily. Via g tube       No current facility-administered medications on file prior to visit.   The medication list was reviewed and reconciled. All changes or newly prescribed medications were explained.  A complete medication list was provided to the patient/caregiver.  Allergies  Allergen Reactions  . Other     Paper and adhesive tape - blisters  . Penicillins     Physical Exam BP 94/60  Pulse 84  Resp 18 General: thin female with increased tone, contractures and wasting of her arms and legs, lying on the exam table Head: oxycephaly, cranial encephalocele, mid-face hypoplasia Neck: supple with no carotid or supraclavicular bruits. Respiratory: lungs clear to auscultation Cardiovascular: regular rate and rhythm, no murmurs Musculoskeletal: increased tone, fixed contractures and atrophy of her limbs, with some sparing of the right arm. Skin: She has callouses on her some of her fingers from chewing on them. She has mild redness on her bony  prominences  Neurologic Exam  Mental Status: She has severe mental retardation. She has no language. 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 She is calm today but holds her hands over her face.  Cranial Nerves: She has dysconjugate roving eye movements. I could not illicit a red reflex. She occasionally turned to localize sounds but I could not get her to do so consistently. She has lower facial weakness with drooling. She does not protrude the tongue but it appears to be midline.  Motor: Increased tone and rigidity in all extremities. There is some sparing of the right arm, which can be almost fully extended. She has fixed contractures and atrophy in the left arm and both lower extremities. She has tight heel cords. She occasionally rubs her head with her right hand. Sensory: Withdrawal x 4.  Coordination: Unable to follow instructions to assess coordination Gait and Station: Wheelchair bound Reflexes: 1+ in the right upper extremity  and absent in the other extremities.   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  Renee [name] has minor motor seizures that have reduced in frequency to a few times per week.  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 Renee [name] requires total care in all aspects of daily living and her mother is devoted to her. Renee [name] is doing quite well at this time. I will make no changes in her treatment plan. I will see her back in follow up in 6 months or sooner if needed.

## 2013-01-13 ENCOUNTER — Other Ambulatory Visit: Payer: Self-pay | Admitting: Urology

## 2013-01-13 ENCOUNTER — Ambulatory Visit (INDEPENDENT_AMBULATORY_CARE_PROVIDER_SITE_OTHER): Payer: 59 | Admitting: Urology

## 2013-01-13 DIAGNOSIS — N390 Urinary tract infection, site not specified: Secondary | ICD-10-CM

## 2013-01-13 DIAGNOSIS — R339 Retention of urine, unspecified: Secondary | ICD-10-CM

## 2013-01-26 ENCOUNTER — Ambulatory Visit (HOSPITAL_COMMUNITY)
Admission: RE | Admit: 2013-01-26 | Discharge: 2013-01-26 | Disposition: A | Discharge status: Home / Self Care | Payer: 59 | Source: Ambulatory Visit | Attending: Urology | Admitting: Urology

## 2013-01-26 DIAGNOSIS — N390 Urinary tract infection, site not specified: Secondary | ICD-10-CM | POA: Insufficient documentation

## 2013-01-26 DIAGNOSIS — A523 Neurosyphilis, unspecified: Secondary | ICD-10-CM | POA: Insufficient documentation

## 2013-01-26 DIAGNOSIS — R9389 Abnormal findings on diagnostic imaging of other specified body structures: Secondary | ICD-10-CM | POA: Insufficient documentation

## 2013-04-25 ENCOUNTER — Other Ambulatory Visit: Payer: Self-pay | Admitting: Pulmonary Disease

## 2013-05-01 ENCOUNTER — Encounter (INDEPENDENT_AMBULATORY_CARE_PROVIDER_SITE_OTHER): Payer: Self-pay

## 2013-05-01 ENCOUNTER — Ambulatory Visit (INDEPENDENT_AMBULATORY_CARE_PROVIDER_SITE_OTHER): Payer: 59 | Admitting: Pulmonary Disease

## 2013-05-01 ENCOUNTER — Encounter: Payer: Self-pay | Admitting: Pulmonary Disease

## 2013-05-01 VITALS — BP 104/64 | HR 105 | Ht <= 58 in | Wt <= 1120 oz

## 2013-05-01 DIAGNOSIS — Z23 Encounter for immunization: Secondary | ICD-10-CM

## 2013-05-01 DIAGNOSIS — J45909 Unspecified asthma, uncomplicated: Secondary | ICD-10-CM

## 2013-05-01 DIAGNOSIS — J4530 Mild persistent asthma, uncomplicated: Secondary | ICD-10-CM

## 2013-05-01 MED ORDER — ALBUTEROL SULFATE (2.5 MG/3ML) 0.083% IN NEBU: 2.5000 mg | INHALATION_SOLUTION | Freq: Four times a day (QID) | RESPIRATORY_TRACT | Status: DC

## 2013-05-01 MED ORDER — BUDESONIDE 0.25 MG/2ML IN SUSP: 0.2500 mg | Freq: Two times a day (BID) | RESPIRATORY_TRACT | Status: DC

## 2013-05-01 MED ORDER — ACETYLCYSTEINE 20 % IN SOLN: 2.0000 mL | RESPIRATORY_TRACT | Status: DC | PRN

## 2013-05-01 NOTE — Addendum Note (Signed)
Addended by: Gwynneth Aliment A on: 05/01/2013 11:22 AM   Modules accepted: Orders

## 2013-05-01 NOTE — Assessment & Plan Note (Signed)
Relatively stable on current regimen.  Refilled pulmicort, mucomyst, and albuterol.  She will get flu and pneumonia shots today.  She can continue supplemental oxygen as needed.

## 2013-05-01 NOTE — Patient Instructions (Signed)
Flu and pneumonia shots today Follow up in 6 months

## 2013-05-01 NOTE — Progress Notes (Signed)
Chief Complaint  Patient presents with  . Asthma    CC: Renee [name], Renee [name]   History of Present Illness: Renee [name] is a 27 y.o. female with dyspnea, hypoxemia, and reactive airways disease in setting of cerebral palsy.  She is DNR/DNI.  She is here with her mother who provided history.  She has cough with thick, white sputum.  Neb treatments help.  She is using pulmicort bid, and albuterol once or twice per day.  She uses mucomyst few times per week.  She does not use oxygen unless breathing is really bad >> would have to restrain her to use oxygen otherwise.  She has not had sinus congestion.  She was treated for a bladder infection recently, but otherwise has not had fever.  Renee [name]  has a past medical history of Cerebral palsy; Mental retardation; Hydrocephalus; Seizure disorder; Osteopenia; Hip dysplasia; DNR (do not resuscitate); and DNI (do not intubate).  Renee [name]  has past surgical history that includes Spinal fusion (2005).  Prior to Admission medications   Medication Sig Start Date End Date Taking? Authorizing Provider  acetylcysteine (MUCOMYST) 20 % nebulizer solution Take 2 mLs by nebulization every 4 (four) hours as needed. 06/12/12  Yes Coralyn Helling, MD  albuterol (PROVENTIL) (2.5 MG/3ML) 0.083% nebulizer solution One vial nebulized every four hours as needed 04/14/12 04/14/13 Yes Coralyn Helling, MD  baclofen (LIORESAL) 10 MG tablet 10 mg. Take 2 tablets in the morning, 2 tablets in the afternoon, 1 tablet mid evening 2 tablets at bedtime per bottle.   Yes Historical Provider, MD  ibuprofen (ADVIL,MOTRIN) 100 MG/5ML suspension 200 mg every 4 (four) hours as needed. Per bottle. Via tube   Yes Historical Provider, MD  levofloxacin (LEVAQUIN) 25 MG/ML solution Take 20 mLs (500 mg total) by mouth daily. X 3 additional days 08/11/12  Yes Nyoka Cowden, MD  Nutritional Supplements (JEVITY) LIQD 5 cans daily with 8 ounces of water flush per G- tube after each can.    Yes Historical Provider, MD  Phenylephrine-Guaifenesin (MUCINEX COLD/KIDS) 2.5-100 MG/5ML LIQD 20 ml per G-tube as needed for congestion.   Yes Historical Provider, MD  polyethylene glycol powder (MIRALAX) powder Take 17 g by mouth daily as needed.    Yes Historical Provider, MD  primidone (MYSOLINE) 50 MG tablet 50 mg. 4 tabs every morning, 3 tabs in the afternoon, 4 tabs at bedtime via g tube   Yes Historical Provider, MD  PULMICORT 0.25 MG/2ML nebulizer solution USE 1 VIAL PER NEBULIZER TWICE DAILY. 09/20/12  Yes Coralyn Helling, MD  [name] (RISPERDAL) 0.5 MG tablet 1 tablet in the morning, 2 tablets in the evening via g tube   Yes Historical Provider, MD  Simethicone (GAS-X INFANT DROPS) 40 MG/0.6ML LIQD 3 (three) times daily. Via g tube   Yes Historical Provider, MD    Allergies  Allergen Reactions  . Other     Paper and adhesive tape - blisters  . Penicillins      Physical Exam:  General - Thin, moaning HEENT - no sinus tenderness, no nasal discharge, no oral exudate Cardiac - s1s2 regular, no murmur Chest - no wheeze/rales Abdomen - soft, PEG tube in place Extremities - no edema, contracted Neurologic - awake, non-verbal  Assessment/Plan:  Coralyn Helling, MD [place] [facility] Pager:  [phone] 05/01/2013, 11:07 AM

## 2013-05-05 ENCOUNTER — Other Ambulatory Visit: Payer: Self-pay | Admitting: *Deleted

## 2013-06-03 ENCOUNTER — Encounter: Payer: Self-pay | Admitting: Family

## 2013-06-03 ENCOUNTER — Ambulatory Visit (INDEPENDENT_AMBULATORY_CARE_PROVIDER_SITE_OTHER): Payer: 59 | Admitting: Family

## 2013-06-03 VITALS — BP 96/68 | HR 92 | Wt <= 1120 oz

## 2013-06-03 DIAGNOSIS — R454 Irritability and anger: Secondary | ICD-10-CM

## 2013-06-03 DIAGNOSIS — F72 Severe intellectual disabilities: Secondary | ICD-10-CM

## 2013-06-03 DIAGNOSIS — Z982 Presence of cerebrospinal fluid drainage device: Secondary | ICD-10-CM

## 2013-06-03 DIAGNOSIS — G253 Myoclonus: Secondary | ICD-10-CM

## 2013-06-03 DIAGNOSIS — Q043 Other reduction deformities of brain: Secondary | ICD-10-CM

## 2013-06-03 DIAGNOSIS — IMO0002 Reserved for concepts with insufficient information to code with codable children: Secondary | ICD-10-CM

## 2013-06-03 DIAGNOSIS — G40319 Generalized idiopathic epilepsy and epileptic syndromes, intractable, without status epilepticus: Secondary | ICD-10-CM

## 2013-06-03 DIAGNOSIS — G910 Communicating hydrocephalus: Secondary | ICD-10-CM

## 2013-06-03 DIAGNOSIS — G809 Cerebral palsy, unspecified: Secondary | ICD-10-CM

## 2013-06-03 DIAGNOSIS — K0381 Cracked tooth: Secondary | ICD-10-CM

## 2013-06-03 DIAGNOSIS — G808 Other cerebral palsy: Secondary | ICD-10-CM

## 2013-06-03 MED ORDER — PRIMIDONE 50 MG PO TABS: ORAL_TABLET | ORAL | Status: DC

## 2013-06-03 NOTE — Progress Notes (Signed)
Patient: Renee [name] MRN: [id number] Sex: female DOB: 1986/03/04  Provider: Elveria Rising, NP Location of Care: [facility]  Note type: Routine return visit  History of Present Illness: Referral Source: Dr. Corrie Mckusick History from: Mother Chief Complaint: Seizures/Possible Medication Changes   Renee [name] is a 27 y.o. 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 Renee [name] has minor motor seizures that have reduced in frequency to a few times per week. 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 The patient is wheelchair bound and requires total care. She requires nebulizer treatments and intermittent oxygen at home. She has difficulties managing respiratory secretions and sees a pulmonologist regularly.   Renee [name] has episodes of irritability and crying. Tizanidine, Baclofen and [name] have worked to reduce her irritability. Sometimes Camylle becomes irritable when she has urinary tract infections. She voids very little spontaneously. Her mother performs in and out cath twice per day and says that she receives malodorous urine and pus with the catheterizations.   Renee [name] is seen by the [facility] for her pressures sores. She has been provided with an air mattress at home and she receives regular treatments to her bony pressure areas that are prone to skin breakdown. Her skin is currently in good condition.   Renee [name] is seen urgently today because her mother feels that her irritability and crying has a different quality than usual for the past couple of weeks. Her medications and usual comfort measures have not worked to help to soothe her. She is crying constantly in the exam room today, with a facial grimace, and rubbing her hand across her mouth and her head. She is producing tears  with the crying and appears to be uncomfortable. As her mother wiped drool from her mouth, one of her front lower teeth broke off close to the gum line. Renee [name]'s mother said that because of her fragile condition, Renee [name] has never had professional dental care.   Mom reports that Meka is also having increased jerking of arms and legs for the past couple of weeks. Mom and caregiver with her today describe it as flexing or jerking movements, along with some lifting of the extremities.   Review of Systems: 12 system review was remarkable for difficulty swallowing and seizure  Past Medical History  Diagnosis Date  . Cerebral palsy   . Mental retardation   . Hydrocephalus   . Seizure disorder   . Osteopenia   . Hip dysplasia     bilateral  . DNR (do not resuscitate)   . DNI (do not intubate)   . Seizures    Hospitalizations: yes, Head Injury: no, Nervous System Infections: no, Immunizations up to date: yes Past Medical History Comments: Patient was hospitalized in 2012 due to Pneumonia. She had complications including acute renal failure and pseudo-obstruction of the bowel. Renee [name] was also hospitalized for pneumonia in May, 2010 and October, 2009. She was on a ventilator during the May, 2010 hospitalization. She was gravely ill at that time and end of life discussions occurred. Her mother signed a MOST directive, which is still in force.   Surgical History Past Surgical History  Procedure Laterality Date  . Spinal fusion  2005  . 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 Family History is  negative migraines, seizures, cognitive impairment, blindness, deafness, birth defects, chromosomal disorder, autism.  Social History History   Social History  . Marital Status: Single    Spouse Name: N/A    Number of Children: N/A  . Years of Education: N/A   Social History Main Topics  . Smoking status: Never Smoker   . Smokeless tobacco: Never Used  .  Alcohol Use: No  . Drug Use: No  . Sexual Activity: No   Other Topics Concern  . None   Social History Narrative   Dayrin requires total care in all aspects of daily living. She lives with her mother, who receives some help from a home health aide for Renee [name].   Educational level: special education  Living with mother  School comments Renee [name] was completed her education in 2005 from [employer].  Current Outpatient Prescriptions on File Prior to Visit  Medication Sig Dispense Refill  . acetylcysteine (MUCOMYST) 20 % nebulizer solution Take 2 mLs by nebulization every 4 (four) hours as needed.  60 mL  6  . albuterol (PROVENTIL) (2.5 MG/3ML) 0.083% nebulizer solution Take 3 mLs (2.5 mg total) by nebulization 4 (four) times daily.  150 mL  5  . baclofen (LIORESAL) 10 MG tablet Take 2 tablets in the morning, 2 tablets in the afternoon, 1 tablet mid evening and 2 tablets at bedtime  238 tablet  5  . budesonide (PULMICORT) 0.25 MG/2ML nebulizer solution Take 2 mLs (0.25 mg total) by nebulization 2 (two) times daily.  120 mL  5  . ibuprofen (ADVIL,MOTRIN) 100 MG/5ML suspension 200 mg every 4 (four) hours as needed. Per bottle. Via tube      . Nutritional Supplements (JEVITY) LIQD 5 cans daily with 8 ounces of water flush per G- tube after each can.      Marland Kitchen Phenylephrine-Guaifenesin (MUCINEX COLD/KIDS) 2.5-100 MG/5ML LIQD 20 ml per G-tube as needed for congestion.      . polyethylene glycol powder (MIRALAX) powder Take 17 g by mouth daily as needed.       . primidone (MYSOLINE) 50 MG tablet 4 tabs every morning, 3 tabs in the afternoon, 4 tabs at bedtime via g tube  374 tablet  5  . [name] (RISPERDAL) 0.5 MG tablet 1 tablet in the morning, 2 tablets in the evening via g tube  90 tablet  5  . Simethicone (GAS-X INFANT DROPS) 40 MG/0.6ML LIQD 3 (three) times daily. Via g tube      . tiZANidine (ZANAFLEX) 2 MG tablet 1/2 to 1 tablet at bedtime per G-Tube for spasms as needed  30 tablet  5   No  current facility-administered medications on file prior to visit.   The medication list was reviewed and reconciled. All changes or newly prescribed medications were explained.  A complete medication list was provided to the patient/caregiver.  Allergies  Allergen Reactions  . Other     Paper and adhesive tape - blisters  . Penicillins     Physical Exam BP 96/68  Pulse 92  Wt 48 lb (21.773 kg) General: thin female with increased tone, contractures and wasting of her arms and legs, lying on the exam table  Head: oxycephaly, cranial encephalocele, mid-face hypoplasia  Neck: supple with no carotid or supraclavicular bruits.  Respiratory: lungs clear to auscultation  Cardiovascular: regular rate and rhythm, no murmurs  Musculoskeletal: increased tone, fixed contractures and atrophy of her limbs, with some sparing of the right arm.  Skin: She has callouses on her  some of her fingers from chewing on them. She has mild redness on her bony prominences   Neurologic Exam  Mental Status: She has severe mental retardation. She has no language. 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 She is crying constantly today. Cranial Nerves: She has dysconjugate roving eye movements. I could not illicit a red reflex. She occasionally turned to localize sounds but I could not get her to do so consistently. She has lower facial weakness with drooling. She does not protrude the tongue but it appears to be midline.  Motor: Increased tone and rigidity in all extremities. There is some sparing of the right arm, which can be almost fully extended. She has fixed contractures and atrophy in the left arm and both lower extremities. She has tight heel cords. She occasionally rubs her mouth and her head with her right hand.  Sensory: Withdrawal x 4.  Coordination: Unable to follow instructions to assess coordination  Gait and Station: Wheelchair bound  Reflexes: 1+ in the right upper extremity and absent in the  other extremities.  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 Renee [name] has minor motor seizures that have reduced in frequency to a few times per week. 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 Renee [name] requires total care in all aspects of daily living and her mother is devoted to her. Renee [name] is more irritable and appears to be in pain. A lower front tooth broke off when Mom wiped drool from her mouth. I called Dr Vinson Moselle, Pediatric Dentist, who will see Renee [name] tomorrow. For her arm and leg jerks that are presumed seizure activity, I recommended that Mom increase the Primidone dose to 4 tablets 3 times per day. I asked Mom to call me on Monday 06/08/13 to report on her condition. I will see otherwise her back in follow up in 6 months or sooner if needed.

## 2013-06-03 NOTE — Patient Instructions (Signed)
I have scheduled an appointment for Renee [name] with Dr Vinson Moselle, pediatric dentist, to evaluate the broken tooth. The appointment is tomorrow at 11:30AM. The office phone number is [phone] if you need to reschedule.  For her increase in arm and leg jerks that we feel may be seizures, increase the Primidone 50mg  dose to 4 tablets 3 times per day.  Call me on Monday 06/08/13 to let me know how she is doing with that increase. Continue Elton's other medications without change for now.  I will see her back in follow up in 6 months or sooner if needed.

## 2013-06-05 ENCOUNTER — Encounter: Payer: Self-pay | Admitting: Family

## 2013-06-08 ENCOUNTER — Telehealth: Payer: Self-pay

## 2013-06-08 NOTE — Telephone Encounter (Signed)
I called and talked with Mom. She said that thus far, the increase in Primidone had not affected the movements of her extremities that we had evaluated last week. It was difficult to tell if these movements were seizure activity or spasticity. She said that Lenoir had not been sleepy from the increase and that the movements had continued without any change. Mom said that she felt that they were more likely spasticity after watching them more closely after her visit last week. She said that she still looked miserable and in pain. I recommended increase of Baclofen 10mg  to 2 tablets 4 times per day. She had been on 2 tablets in the morning, 2 tablets at lunch, 1 tablet in the evening and 2 tablets at bedtime. I asked Mom to call me back by end of the week and let me know how she was doing.   Mom also said that Bernell had tolerated the dental visit last week well. Fortunately, her tooth was not abscessed but was very heavily caked with plaque and that was what we thought we saw broken off at the visit, not an actual tooth. They now have a plan for ultrasonic cleanings, so Ronica will not require anesthesia. TG

## 2013-06-08 NOTE — Telephone Encounter (Signed)
I reviewed your note and agree with this plan. 

## 2013-06-08 NOTE — Telephone Encounter (Signed)
May, mother, lvm stating that pt had an appt 06/03/13. The Primidone 50 mg was increased to 4 tabs tid due to frequent tremors. Mom said that despite the increase pt is still having several tremors daily. She would like to discuss what the next step should be. Please call [name] at [phone].

## 2013-06-11 NOTE — Telephone Encounter (Signed)
[name], mother, lvm stating that she started increasing the Baclofen on Monday as instructed. Yesterday, Renee [name] had 3 episodes. Today, she had 7-8 episodes. Mom said that she does not think the increase is helping and would like to speak w [name]. Please call mom on her cell at [phone].

## 2013-06-11 NOTE — Telephone Encounter (Signed)
I called and talked with Mom. She said that yesterday Chavela had a good day and had few movements of her extremities. Today she had 7-8 in a short period of time. She was calm, not agitated during that time. She seemed to do better after the 11AM dose of medications and had fewer episodes of the extremity movements after that. Mom said that she seemed otherwise well, no fever, no signs of illness. Her O2 sats were down slightly for awhile this morning but she does that periodically and the caregiver put on O2 and Kissie responded quickly. I asked Mom to continue the Baclofen dose without change for now as I do not want to make too many changes too quickly. I also talked to Mom about getting a video of the behavior and she agreed to do that. I will wait her hear from her about that. TG

## 2013-06-11 NOTE — Telephone Encounter (Signed)
I reviewed your note and agree with your plan. 

## 2013-06-22 ENCOUNTER — Other Ambulatory Visit: Payer: Self-pay | Admitting: Family

## 2013-07-06 ENCOUNTER — Other Ambulatory Visit: Payer: Self-pay | Admitting: Family

## 2013-07-14 ENCOUNTER — Other Ambulatory Visit: Payer: Self-pay | Admitting: Urology

## 2013-07-14 ENCOUNTER — Ambulatory Visit (INDEPENDENT_AMBULATORY_CARE_PROVIDER_SITE_OTHER): Payer: 59 | Admitting: Urology

## 2013-07-14 DIAGNOSIS — G809 Cerebral palsy, unspecified: Secondary | ICD-10-CM

## 2013-07-14 DIAGNOSIS — N390 Urinary tract infection, site not specified: Secondary | ICD-10-CM

## 2013-07-14 DIAGNOSIS — R339 Retention of urine, unspecified: Secondary | ICD-10-CM

## 2013-07-28 ENCOUNTER — Other Ambulatory Visit: Payer: Self-pay | Admitting: Family

## 2013-08-03 ENCOUNTER — Other Ambulatory Visit (HOSPITAL_COMMUNITY): Payer: Self-pay | Admitting: Family Medicine

## 2013-08-03 DIAGNOSIS — R140 Abdominal distension (gaseous): Secondary | ICD-10-CM

## 2013-08-03 DIAGNOSIS — R141 Gas pain: Secondary | ICD-10-CM

## 2013-08-03 DIAGNOSIS — R143 Flatulence: Principal | ICD-10-CM

## 2013-08-03 DIAGNOSIS — R142 Eructation: Principal | ICD-10-CM

## 2013-08-05 ENCOUNTER — Ambulatory Visit (HOSPITAL_COMMUNITY)
Admission: RE | Admit: 2013-08-05 | Discharge: 2013-08-05 | Disposition: A | Discharge status: Home / Self Care | Payer: 59 | Source: Ambulatory Visit | Attending: Family Medicine | Admitting: Family Medicine

## 2013-08-05 DIAGNOSIS — R143 Flatulence: Principal | ICD-10-CM

## 2013-08-05 DIAGNOSIS — R141 Gas pain: Secondary | ICD-10-CM

## 2013-08-05 DIAGNOSIS — R142 Eructation: Principal | ICD-10-CM | POA: Insufficient documentation

## 2013-08-05 DIAGNOSIS — R140 Abdominal distension (gaseous): Secondary | ICD-10-CM

## 2013-08-12 ENCOUNTER — Telehealth: Payer: Self-pay | Admitting: Pulmonary Disease

## 2013-08-12 NOTE — Telephone Encounter (Signed)
I spoke with the pt mother and she states the [facility] because she is having bowel issues and they did a KUB and it showed PNA. They were advised to follow-up here. Appt set tomorrow at 10:30am with TP. [place] Bing, CMA

## 2013-08-13 ENCOUNTER — Encounter: Payer: Self-pay | Admitting: Adult Health

## 2013-08-13 ENCOUNTER — Ambulatory Visit (INDEPENDENT_AMBULATORY_CARE_PROVIDER_SITE_OTHER): Payer: 59 | Admitting: Adult Health

## 2013-08-13 VITALS — BP 100/68 | HR 99 | Temp 97.9°F

## 2013-08-13 DIAGNOSIS — J189 Pneumonia, unspecified organism: Secondary | ICD-10-CM

## 2013-08-13 MED ORDER — LEVOFLOXACIN 25 MG/ML PO SOLN: 500.0000 mg | Freq: Every day | ORAL | Status: DC

## 2013-08-13 NOTE — Progress Notes (Signed)
Reviewed and agree with assessment/plan. 

## 2013-08-13 NOTE — Progress Notes (Signed)
   Subjective:    Patient ID: Renee [name], female    DOB: 01-09-1986, 28 y.o.   MRN: [id number]  HPI 28 yo with known  hypoxemia, and reactive airways disease in setting of cerebral palsy.  She is DNR/DNI.  08/13/2013 Acute OV  Complains of increased cough, for last few days .  Was seen at [facility] on 1/20 for GI issues .  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 No bowel obstruction per MOM. Was given Go-Lytely w/ good results last pm.  Gets bolus feeds in peg, no residuals per mom.  No change in behavior.  Denies f/c/s, increased SOB, or increased wheezing. No abx since Oct. (for UTI last )  Has used Levaquin in past without difficulty .      Review of Systems Constitutional:   No  weight loss, night sweats,  Fevers, chills, fatigue, or  lassitude.  HEENT:   No headaches,  Difficulty swallowing,  Tooth/dental problems, or  Sore throat,                No sneezing, itching, ear ache, nasal congestion, post nasal drip,   CV:  No chest pain,  Orthopnea, PND, swelling in lower extremities, anasarca, dizziness, palpitations, syncope.   GI  No heartburn, indigestion, abdominal pain, nausea, vomiting, diarrhea, change in bowel habits, loss of appetite, bloody stools.   Resp:    No chest wall deformity  Skin: no rash or lesions.  GU: no dysuria, change in color of urine, no urgency or frequency.  No flank pain, no hematuria   MS:  No joint pain or swelling.  No decreased range of motion.  No back pain.  Psych:  No change in mood or affect. No depression or anxiety.  No memory loss.          Objective:   Physical Exam General - Thin, moaning HEENT - no sinus tenderness, no nasal discharge, no oral exudate Cardiac - s1s2 regular, no murmur Chest - no wheeze/rales Abdomen - soft, PEG tube in place Extremities - no edema, contracted Neurologic - awake, non-verbal        Assessment & Plan:

## 2013-08-13 NOTE — Addendum Note (Signed)
Addended by: Parke Poisson E on: 08/13/2013 11:18 AM   Modules accepted: Orders

## 2013-08-13 NOTE — Patient Instructions (Signed)
Levaquin 500mg  (20 ml ) daily per tube for 7days  Restart Budesonide Neb Twice daily   Follow up Dr. Halford Chessman  In 3-4 weeks and As needed   Please contact office for sooner follow up if symptoms do not improve or worsen or seek emergency care

## 2013-08-13 NOTE — Assessment & Plan Note (Signed)
LLL PNA noted on incidentally on recent KUB at [facility] 500mg  (20 ml ) daily per tube for 7days  Restart Budesonide Neb Twice daily   Follow up Dr. Halford Chessman  In 3-4 weeks and As needed   Please contact office for sooner follow up if symptoms do not improve or worsen or seek emergency care

## 2013-08-20 ENCOUNTER — Telehealth: Payer: Self-pay | Admitting: Pulmonary Disease

## 2013-08-20 MED ORDER — LEVOFLOXACIN 25 MG/ML PO SOLN: 500.0000 mg | Freq: Every day | ORAL | Status: DC

## 2013-08-20 NOTE — Telephone Encounter (Signed)
Pt seen TP on 08/13/13 .  Takes last dose of Levaquin today.  Pt still has loose prod cough (white).  Mom is getting a lot of mucus when suctioning.  Mom wants to know if pt should take another round of abx just to be safe.  Please advise.

## 2013-08-20 NOTE — Telephone Encounter (Signed)
Can extend levaquin for additional 3 days  levaquin 500mg  (34ml ) daily for 3 days  Cont w/ ov recs  follow up Dr. Halford Chessman  As planned and As needed   Please contact office for sooner follow up if symptoms do not improve or worsen or seek emergency care

## 2013-08-20 NOTE — Telephone Encounter (Signed)
Spoke to pt's mom and advised of TP's recommendations.  Rx sent to pharmacy.

## 2013-09-08 ENCOUNTER — Ambulatory Visit: Payer: 59 | Admitting: Pulmonary Disease

## 2013-09-10 ENCOUNTER — Ambulatory Visit: Payer: 59 | Admitting: Pulmonary Disease

## 2013-09-22 ENCOUNTER — Ambulatory Visit
Admission: RE | Admit: 2013-09-22 | Discharge: 2013-09-22 | Disposition: A | Discharge status: Home / Self Care | Payer: 59 | Source: Ambulatory Visit | Attending: Pulmonary Disease | Admitting: Pulmonary Disease

## 2013-09-22 ENCOUNTER — Encounter: Payer: Self-pay | Admitting: Pulmonary Disease

## 2013-09-22 ENCOUNTER — Ambulatory Visit (INDEPENDENT_AMBULATORY_CARE_PROVIDER_SITE_OTHER): Payer: 59 | Admitting: Pulmonary Disease

## 2013-09-22 VITALS — BP 96/60 | HR 86 | Wt <= 1120 oz

## 2013-09-22 DIAGNOSIS — J189 Pneumonia, unspecified organism: Secondary | ICD-10-CM

## 2013-09-22 DIAGNOSIS — J4530 Mild persistent asthma, uncomplicated: Secondary | ICD-10-CM

## 2013-09-22 DIAGNOSIS — J45909 Unspecified asthma, uncomplicated: Secondary | ICD-10-CM

## 2013-09-22 NOTE — Addendum Note (Signed)
Addended by: Horatio Pel on: 09/22/2013 05:03 PM   Modules accepted: Orders

## 2013-09-22 NOTE — Progress Notes (Signed)
Chief Complaint  Patient presents with  . Asthma    Mother reports that her congestion has not changed.    CC: Sharilyn Sites, Wyline Copas   History of Present Illness: Renee [name] is a 28 y.o. female with dyspnea, hypoxemia, and reactive airways disease in setting of cerebral palsy.  She is DNR/DNI.  She is here with her mother who provided history.  She was found to have pneumonia in January.  She has completed Abx.  She is not having fever.  She gets coughing spell intermittently, and then brings up thick white sputum.  She is getting pulmicort and mucomyst on scheduled basis.  She is not needing albuterol much, and not needing oxygen much.  Renee [name]  has a past medical history of Cerebral palsy; Mental retardation; Hydrocephalus; Seizure disorder; Osteopenia; Hip dysplasia; DNR (do not resuscitate); DNI (do not intubate); and Seizures.  Renee [name]  has past surgical history that includes Spinal fusion (2005) and Shunt Placemant (1989).  Prior to Admission medications   Medication Sig Start Date End Date Taking? Authorizing Provider  acetylcysteine (MUCOMYST) 20 % nebulizer solution Take 2 mLs by nebulization every 4 (four) hours as needed. 06/12/12  Yes [name], MD  albuterol (PROVENTIL) (2.5 MG/3ML) 0.083% nebulizer solution One vial nebulized every four hours as needed 04/14/12 04/14/13 Yes [name], MD  baclofen (LIORESAL) 10 MG tablet 10 mg. Take 2 tablets in the morning, 2 tablets in the afternoon, 1 tablet mid evening 2 tablets at bedtime per bottle.   Yes Historical Provider, MD  ibuprofen (ADVIL,MOTRIN) 100 MG/5ML suspension 200 mg every 4 (four) hours as needed. Per bottle. Via tube   Yes Historical Provider, MD  levofloxacin (LEVAQUIN) 25 MG/ML solution Take 20 mLs (500 mg total) by mouth daily. X 3 additional days 08/11/12  Yes Tanda Rockers, MD  Nutritional Supplements (JEVITY) LIQD 5 cans daily with 8 ounces of water flush per G- tube after each can.   Yes  Historical Provider, MD  Phenylephrine-Guaifenesin (MUCINEX COLD/KIDS) 2.5-100 MG/5ML LIQD 20 ml per G-tube as needed for congestion.   Yes Historical Provider, MD  polyethylene glycol powder (MIRALAX) powder Take 17 g by mouth daily as needed.    Yes Historical Provider, MD  primidone (MYSOLINE) 50 MG tablet 50 mg. 4 tabs every morning, 3 tabs in the afternoon, 4 tabs at bedtime via g tube   Yes Historical Provider, MD  PULMICORT 0.25 MG/2ML nebulizer solution USE 1 VIAL PER NEBULIZER TWICE DAILY. 09/20/12  Yes [name], MD  [name] (RISPERDAL) 0.5 MG tablet 1 tablet in the morning, 2 tablets in the evening via g tube   Yes Historical Provider, MD  Simethicone (GAS-X INFANT DROPS) 40 MG/0.6ML LIQD 3 (three) times daily. Via g tube   Yes Historical Provider, MD    Allergies  Allergen Reactions  . Other     Paper and adhesive tape - blisters  . Penicillins      Physical Exam:  General - Thin, moaning HEENT - no sinus tenderness, no nasal discharge, no oral exudate Cardiac - s1s2 regular, no murmur Chest - scattered rhonchi, no wheeze/rales Abdomen - soft, PEG tube in place, mild distention Extremities - no edema, contracted Neurologic - awake, non-verbal  Assessment/Plan:  [name], MD  Pulmonary/Critical Care/Sleep Pager:  [phone] 09/22/2013, 4:31 PM

## 2013-09-22 NOTE — Assessment & Plan Note (Signed)
Continue pulmicort, mucomyst, and prn albuterol.  Continue oxygen prn. 

## 2013-09-22 NOTE — Patient Instructions (Signed)
Chest xray today >> will call with results Follow up in 6 months 

## 2013-09-22 NOTE — Assessment & Plan Note (Signed)
Completed antibiotics.  Will f/u 1 view CXR.

## 2013-09-24 ENCOUNTER — Ambulatory Visit (HOSPITAL_COMMUNITY)
Admission: RE | Admit: 2013-09-24 | Discharge: 2013-09-24 | Disposition: A | Discharge status: Home / Self Care | Payer: 59 | Source: Ambulatory Visit | Attending: Pulmonary Disease | Admitting: Pulmonary Disease

## 2013-09-24 DIAGNOSIS — J189 Pneumonia, unspecified organism: Secondary | ICD-10-CM | POA: Insufficient documentation

## 2013-09-24 DIAGNOSIS — J4530 Mild persistent asthma, uncomplicated: Secondary | ICD-10-CM

## 2013-09-28 ENCOUNTER — Telehealth: Payer: Self-pay | Admitting: Pulmonary Disease

## 2013-09-28 NOTE — Telephone Encounter (Signed)
Pt's mother is aware of results.  

## 2013-09-28 NOTE — Telephone Encounter (Signed)
Dg Chest 1 View  09/24/2013   CLINICAL DATA:  Pneumonia  EXAM: CHEST - 1 VIEW  COMPARISON:  08/08/2010  FINDINGS: Dextroscoliosis.  Harrington rods.  Cardiac enlargement without heart failure. Lungs are clear without infiltrate or effusion.  IMPRESSION: No active disease.   Electronically Signed   By: Franchot Gallo M.D.   On: 09/24/2013 08:47   Will have my nurse inform pt's mother that the CXR was okay >> no pneumonia.

## 2013-10-14 ENCOUNTER — Telehealth: Payer: Self-pay | Admitting: Adult Health

## 2013-10-14 NOTE — Telephone Encounter (Signed)
Renee [name] has had first period in over a year after coming off depo per Dr Cornelia Copa advice, and worried about osteoporosis.She has CP, is in wheelchair and has feeding tube.the period is heavy with clots, wants to go back on depo if OK, will discuss with MD and call back in am.Renee [name] weighs 58 lbs.

## 2013-10-15 NOTE — Telephone Encounter (Signed)
Spoke with Dr Elonda Husky, he said to have Mom call Dr Hilma Favors and let him know that she is bleeding and see what he wants to do.

## 2013-10-16 ENCOUNTER — Telehealth: Payer: Self-pay | Admitting: Adult Health

## 2013-10-16 ENCOUNTER — Encounter: Payer: Self-pay | Admitting: Obstetrics & Gynecology

## 2013-10-16 ENCOUNTER — Ambulatory Visit: Payer: 59

## 2013-10-16 ENCOUNTER — Ambulatory Visit (INDEPENDENT_AMBULATORY_CARE_PROVIDER_SITE_OTHER): Payer: 59 | Admitting: Obstetrics & Gynecology

## 2013-10-16 DIAGNOSIS — Z7689 Persons encountering health services in other specified circumstances: Secondary | ICD-10-CM

## 2013-10-16 MED ORDER — MEDROXYPROGESTERONE ACETATE 150 MG/ML IM SUSP
150.0000 mg | Freq: Once | INTRAMUSCULAR | Status: AC
  Administered 2013-10-16: 150 mg via INTRAMUSCULAR

## 2013-10-16 MED ORDER — MEDROXYPROGESTERONE ACETATE 150 MG/ML IM SUSP: 150.0000 mg | INTRAMUSCULAR | Status: DC

## 2013-10-16 NOTE — Progress Notes (Signed)
Pt here for Depo shot. Is on period now. Pt to return in 12 weeks for next shot. [place]

## 2013-10-16 NOTE — Telephone Encounter (Signed)
To resume depo, Dr Hilma Favors told Mom ok, will rx and make appt for today

## 2013-11-02 ENCOUNTER — Other Ambulatory Visit: Payer: Self-pay | Admitting: Family

## 2013-11-17 ENCOUNTER — Telehealth: Payer: Self-pay | Admitting: *Deleted

## 2013-11-17 NOTE — Telephone Encounter (Signed)
Morene Antu, nurse and clinical supervisor of [facility] called at 9:06 am. She is calling to get a verbal order to continue in home CNA  care for the pt. You can contact her at [phone]

## 2013-11-17 NOTE — Telephone Encounter (Signed)
I called and approved the in home CNA recertification. TG

## 2013-11-24 ENCOUNTER — Ambulatory Visit (INDEPENDENT_AMBULATORY_CARE_PROVIDER_SITE_OTHER): Payer: 59 | Admitting: Family

## 2013-11-24 ENCOUNTER — Encounter: Payer: Self-pay | Admitting: Family

## 2013-11-24 VITALS — BP 94/84 | HR 90 | Wt <= 1120 oz

## 2013-11-24 DIAGNOSIS — Z982 Presence of cerebrospinal fluid drainage device: Secondary | ICD-10-CM

## 2013-11-24 DIAGNOSIS — G808 Other cerebral palsy: Secondary | ICD-10-CM

## 2013-11-24 DIAGNOSIS — G809 Cerebral palsy, unspecified: Secondary | ICD-10-CM

## 2013-11-24 DIAGNOSIS — G40319 Generalized idiopathic epilepsy and epileptic syndromes, intractable, without status epilepticus: Secondary | ICD-10-CM

## 2013-11-24 DIAGNOSIS — F72 Severe intellectual disabilities: Secondary | ICD-10-CM

## 2013-11-24 DIAGNOSIS — G253 Myoclonus: Secondary | ICD-10-CM

## 2013-11-24 DIAGNOSIS — R454 Irritability and anger: Secondary | ICD-10-CM

## 2013-11-24 DIAGNOSIS — G910 Communicating hydrocephalus: Secondary | ICD-10-CM

## 2013-11-24 DIAGNOSIS — IMO0002 Reserved for concepts with insufficient information to code with codable children: Secondary | ICD-10-CM

## 2013-11-24 DIAGNOSIS — Q043 Other reduction deformities of brain: Secondary | ICD-10-CM

## 2013-11-24 MED ORDER — RISPERIDONE 0.5 MG PO TABS: ORAL_TABLET | ORAL | Status: DC

## 2013-11-24 MED ORDER — PRIMIDONE 50 MG PO TABS: ORAL_TABLET | ORAL | Status: DC

## 2013-11-24 MED ORDER — BACLOFEN 10 MG PO TABS: ORAL_TABLET | ORAL | Status: DC

## 2013-11-24 MED ORDER — TIZANIDINE HCL 2 MG PO TABS: ORAL_TABLET | ORAL | Status: DC

## 2013-11-24 NOTE — Progress Notes (Signed)
Patient: Renee [name] MRN: [id number] Sex: female DOB: [birth year]  Provider: Rockwell Germany, NP Location of Care: [name]  Note type: Routine return visit  History of Present Illness: Referral Source: Dr. Halford [name] History from: Mother Chief Complaint: Seizure  Renee [name] is a 28 y.o. 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 Renee [name] has minor motor seizures that have reduced in frequency to a few times per week. 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 The patient is wheelchair bound and requires total care. She requires nebulizer treatments and intermittent oxygen at home. She has difficulties managing respiratory secretions and sees a pulmonologist regularly.  Her mother reports today that she has been requiring more nebulizer treatments recently because of her response to seasonal allergies.   Renee [name] has episodes of irritability and crying. Tizanidine, Baclofen and [name] have worked to reduce her irritability. Sometimes Renee [name] becomes irritable when she has urinary tract infections. She voids very little spontaneously. Her mother performs in and out cath twice per day and says that she receives malodorous urine and pus with the catheterizations. Her mother says that Renee [name] is scheduled for renal ultrasound in June and that her urologist is considering adding bladder irrigations to her regimen.  Mom says that her PCP has restarted Renee [name]'s Depo-Provera injections because of concerns about osteopenia. After stopping the injections, she had episodes of very heavy menstrual bleeding and irritability, so the injections were restarted.  Renee [name] is seen by the Renee [name] for her pressures sores. She has been provided with an air mattress at home and she receives regular  treatments to her bony pressure areas that are prone to skin breakdown. Her skin is currently in good condition.   Since Renee [name] was last seen in November, 2014, Renee [name] was seen at [facility]ene[name] for chronic constipation. She is now on a bowel regimen and that has improved.   Review of Systems: 12 system review was remarkable for congestion  Past Medical History  Diagnosis Date  . Cerebral palsy   . Mental retardation   . Hydrocephalus   . Seizure disorder   . Osteopenia   . Hip dysplasia     bilateral  . DNR (do not resuscitate)   . DNI (do not intubate)   . Seizures    Hospitalizations: no, Head Injury: no, Nervous System Infections: no, Immunizations up to date: yes Past Medical History Comments: Patient was hospitalized in 2012 due to Pneumonia. She had complications including acute renal failure and pseudo-obstruction of the bowel. Renee [name] was also hospitalized for pneumonia in May, 2010 and October, 2009. She was on a ventilator during the May, 2010 hospitalization. She was gravely ill at that time and end of life discussions occurred. Her mother signed a Renee [name] directive, which is still in force.   Surgical History Past Surgical History  Procedure Laterality Date  . Spinal fusion  2005  . Shunt placemant  1989    At birth    Family History family history is not on file. Family History is otherwise negative for migraines, seizures, cognitive impairment, blindness, deafness, birth defects, chromosomal disorder, autism.  Social History History   Social History  . Marital Status: Single    Spouse Name: N/A    Number of Children: N/A  . Years of Education: N/A  Social History Main Topics  . Smoking status: Never Smoker   . Smokeless tobacco: Never Used  . Alcohol Use: No  . Drug Use: No  . Sexual Activity: No   Other Topics Concern  . None   Social History Narrative   Renee [name] requires total care in all aspects of daily living. She lives with her  mother, who receives some help from a home health aide for Renee [name].   Educational level: special education School Attending:N/A Living with:  mother  Hobbies/Interest: Watching game shows. School comments:  [name] attended [name] and then was changed to Renee [name]. She was withdrawn in 2005.   Physical Exam BP 94/84  Pulse 90  Wt 60 lb (27.216 kg)  LMP 08/23/2013 General: thin female with increased tone, contractures and wasting of her arms and legs, lying on the exam table  Head: oxycephaly, cranial encephalocele, mid-face hypoplasia  Neck: supple with no carotid or supraclavicular bruits.  Respiratory: lungs clear to auscultation  Cardiovascular: regular rate and rhythm, no murmurs  Musculoskeletal: increased tone, fixed contractures and atrophy of her limbs, with some sparing of the right arm.  Skin: She has callouses on her some of her fingers from chewing on them. She has mild redness on her bony prominences   Neurologic Exam  Mental Status: She has severe mental retardation. She has no language. 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 She was calmer today than in previous visits.  Cranial Nerves: She has dysconjugate roving eye movements. I could not illicit a red reflex. She occasionally turned to localize sounds but I could not get her to do so consistently. She has lower facial weakness with drooling. She does not protrude the tongue but it appears to be midline.  Motor: Increased tone and rigidity in all extremities. There is some sparing of the right arm, which can be almost fully extended. She has fixed contractures and atrophy in the left arm and both lower extremities. She has tight heel cords. She occasionally rubs her mouth and her head with her right hand.  Sensory: Withdrawal x 4.  Coordination: Unable to follow instructions to assess coordination  Gait and Station: Wheelchair bound  Reflexes: 1+ in the right upper extremity and absent in the other  extremities.   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 Brylynn has minor motor seizures that have reduced in frequency to a few times per week. 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 Terran has been requiring additional nebulizer treatments recently but looks quite well today. Her mother is a strong advocate for Yemariam and is devoted to her care. I will see Myishia back in follow up in 6 months or sooner if needed.

## 2013-11-24 NOTE — Patient Instructions (Signed)
Continue Cerys's medications without change for now. Call me if you have any questions or concerns. Please plan to return for follow up in 6 months or sooner if needed.

## 2013-11-25 ENCOUNTER — Encounter: Payer: Self-pay | Admitting: Family

## 2013-11-26 ENCOUNTER — Other Ambulatory Visit: Payer: Self-pay | Admitting: Pulmonary Disease

## 2013-11-27 ENCOUNTER — Other Ambulatory Visit: Payer: Self-pay | Admitting: Family

## 2013-11-27 ENCOUNTER — Ambulatory Visit: Payer: 59

## 2013-11-27 DIAGNOSIS — G808 Other cerebral palsy: Secondary | ICD-10-CM

## 2013-11-27 DIAGNOSIS — G809 Cerebral palsy, unspecified: Secondary | ICD-10-CM

## 2013-11-27 MED ORDER — BACLOFEN 10 MG PO TABS: ORAL_TABLET | ORAL | Status: DC

## 2013-11-27 NOTE — Telephone Encounter (Signed)
Previous Rx did not print

## 2013-12-24 ENCOUNTER — Inpatient Hospital Stay (HOSPITAL_COMMUNITY): Payer: 59

## 2013-12-24 ENCOUNTER — Ambulatory Visit (INDEPENDENT_AMBULATORY_CARE_PROVIDER_SITE_OTHER): Payer: 59 | Admitting: Adult Health

## 2013-12-24 ENCOUNTER — Encounter: Payer: Self-pay | Admitting: Adult Health

## 2013-12-24 ENCOUNTER — Encounter (HOSPITAL_COMMUNITY): Payer: Self-pay | Admitting: Emergency Medicine

## 2013-12-24 ENCOUNTER — Inpatient Hospital Stay (HOSPITAL_COMMUNITY)
Admission: AD | Admit: 2013-12-24 | Discharge: 2013-12-28 | DRG: 177 | Disposition: A | Discharge status: Home Health | Payer: 59 | Source: Ambulatory Visit | Attending: Emergency Medicine | Admitting: Emergency Medicine

## 2013-12-24 VITALS — BP 124/76 | HR 104 | Temp 98.3°F

## 2013-12-24 DIAGNOSIS — J189 Pneumonia, unspecified organism: Secondary | ICD-10-CM

## 2013-12-24 DIAGNOSIS — Z982 Presence of cerebrospinal fluid drainage device: Secondary | ICD-10-CM

## 2013-12-24 DIAGNOSIS — J690 Pneumonitis due to inhalation of food and vomit: Principal | ICD-10-CM

## 2013-12-24 DIAGNOSIS — G911 Obstructive hydrocephalus: Secondary | ICD-10-CM | POA: Diagnosis present

## 2013-12-24 DIAGNOSIS — G809 Cerebral palsy, unspecified: Secondary | ICD-10-CM | POA: Diagnosis present

## 2013-12-24 DIAGNOSIS — J4530 Mild persistent asthma, uncomplicated: Secondary | ICD-10-CM | POA: Diagnosis present

## 2013-12-24 DIAGNOSIS — E8779 Other fluid overload: Secondary | ICD-10-CM | POA: Diagnosis present

## 2013-12-24 DIAGNOSIS — Q043 Other reduction deformities of brain: Secondary | ICD-10-CM

## 2013-12-24 DIAGNOSIS — K5900 Constipation, unspecified: Secondary | ICD-10-CM | POA: Diagnosis present

## 2013-12-24 DIAGNOSIS — G40319 Generalized idiopathic epilepsy and epileptic syndromes, intractable, without status epilepticus: Secondary | ICD-10-CM | POA: Diagnosis present

## 2013-12-24 DIAGNOSIS — M949 Disorder of cartilage, unspecified: Secondary | ICD-10-CM

## 2013-12-24 DIAGNOSIS — Z981 Arthrodesis status: Secondary | ICD-10-CM

## 2013-12-24 DIAGNOSIS — F79 Unspecified intellectual disabilities: Secondary | ICD-10-CM | POA: Diagnosis present

## 2013-12-24 DIAGNOSIS — E43 Unspecified severe protein-calorie malnutrition: Secondary | ICD-10-CM

## 2013-12-24 DIAGNOSIS — J9620 Acute and chronic respiratory failure, unspecified whether with hypoxia or hypercapnia: Secondary | ICD-10-CM

## 2013-12-24 DIAGNOSIS — E46 Unspecified protein-calorie malnutrition: Secondary | ICD-10-CM

## 2013-12-24 DIAGNOSIS — M899 Disorder of bone, unspecified: Secondary | ICD-10-CM | POA: Diagnosis present

## 2013-12-24 DIAGNOSIS — Z79899 Other long term (current) drug therapy: Secondary | ICD-10-CM

## 2013-12-24 DIAGNOSIS — Z681 Body mass index (BMI) 19 or less, adult: Secondary | ICD-10-CM

## 2013-12-24 DIAGNOSIS — G808 Other cerebral palsy: Secondary | ICD-10-CM

## 2013-12-24 DIAGNOSIS — J45909 Unspecified asthma, uncomplicated: Secondary | ICD-10-CM | POA: Diagnosis present

## 2013-12-24 DIAGNOSIS — Z66 Do not resuscitate: Secondary | ICD-10-CM | POA: Diagnosis present

## 2013-12-24 DIAGNOSIS — Z931 Gastrostomy status: Secondary | ICD-10-CM

## 2013-12-24 LAB — URINALYSIS, ROUTINE W REFLEX MICROSCOPIC
BILIRUBIN URINE: NEGATIVE
Glucose, UA: NEGATIVE mg/dL
HGB URINE DIPSTICK: NEGATIVE
KETONES UR: NEGATIVE mg/dL
Nitrite: POSITIVE — AB
PH: 7 (ref 5.0–8.0)
Protein, ur: NEGATIVE mg/dL
Specific Gravity, Urine: 1.009 (ref 1.005–1.030)
Urobilinogen, UA: 0.2 mg/dL (ref 0.0–1.0)

## 2013-12-24 LAB — PROCALCITONIN: Procalcitonin: <0.1 ng/mL

## 2013-12-24 LAB — CBC
HCT: 49.5 % — ABNORMAL HIGH (ref 36.0–46.0)
Hemoglobin: 16.4 g/dL — ABNORMAL HIGH (ref 12.0–15.0)
MCH: 34.1 pg — AB (ref 26.0–34.0)
MCHC: 33.1 g/dL (ref 30.0–36.0)
MCV: 102.9 fL — ABNORMAL HIGH (ref 78.0–100.0)
PLATELETS: 204 10*3/uL (ref 150–400)
RBC: 4.81 MIL/uL (ref 3.87–5.11)
RDW: 13.2 % (ref 11.5–15.5)
WBC: 8.4 10*3/uL (ref 4.0–10.5)

## 2013-12-24 LAB — COMPREHENSIVE METABOLIC PANEL
ALBUMIN: 3.3 g/dL — AB (ref 3.5–5.2)
ALT: 58 U/L — ABNORMAL HIGH (ref 0–35)
AST: 59 U/L — ABNORMAL HIGH (ref 0–37)
Alkaline Phosphatase: 151 U/L — ABNORMAL HIGH (ref 39–117)
BUN: 8 mg/dL (ref 6–23)
CHLORIDE: 97 meq/L (ref 96–112)
CO2: 31 mEq/L (ref 19–32)
Calcium: 9.1 mg/dL (ref 8.4–10.5)
Creatinine, Ser: <0.2 mg/dL — ABNORMAL LOW (ref 0.50–1.10)
Glucose, Bld: 78 mg/dL (ref 70–99)
Potassium: 4.2 mEq/L (ref 3.7–5.3)
Sodium: 139 mEq/L (ref 137–147)
TOTAL PROTEIN: 7.4 g/dL (ref 6.0–8.3)

## 2013-12-24 LAB — URINE MICROSCOPIC-ADD ON

## 2013-12-24 LAB — PHOSPHORUS: PHOSPHORUS: 3.7 mg/dL (ref 2.3–4.6)

## 2013-12-24 LAB — MRSA PCR SCREENING: MRSA by PCR: NEGATIVE

## 2013-12-24 LAB — MAGNESIUM: MAGNESIUM: 2.1 mg/dL (ref 1.5–2.5)

## 2013-12-24 MED ORDER — SODIUM CHLORIDE 0.9 % IV SOLN
125.0000 mg | Freq: Once | INTRAVENOUS | Status: DC
  Administered 2013-12-24: 125 mg via INTRAVENOUS
  Filled 2013-12-24: qty 125

## 2013-12-24 MED ORDER — POLYETHYLENE GLYCOL 3350 17 G PO PACK
17.0000 g | PACK | Freq: Every day | ORAL | Status: DC | PRN
  Administered 2013-12-25 – 2013-12-27 (×2): 17 g via ORAL
  Filled 2013-12-24 (×3): qty 1

## 2013-12-24 MED ORDER — RISPERIDONE 0.5 MG PO TABS
0.5000 mg | ORAL_TABLET | Freq: Two times a day (BID) | ORAL | Status: DC
  Administered 2013-12-24 – 2013-12-28 (×8): 0.5 mg
  Filled 2013-12-24 (×10): qty 1

## 2013-12-24 MED ORDER — DEXTROSE-NACL 5-0.45 % IV SOLN
INTRAVENOUS | Status: DC
  Administered 2013-12-24 – 2013-12-27 (×3): via INTRAVENOUS

## 2013-12-24 MED ORDER — BACLOFEN 10 MG PO TABS
20.0000 mg | ORAL_TABLET | Freq: Three times a day (TID) | ORAL | Status: DC
  Administered 2013-12-24 – 2013-12-28 (×12): 20 mg
  Filled 2013-12-24 (×13): qty 2

## 2013-12-24 MED ORDER — PANTOPRAZOLE SODIUM 40 MG IV SOLR
40.0000 mg | Freq: Every day | INTRAVENOUS | Status: DC
  Administered 2013-12-24 – 2013-12-27 (×4): 40 mg via INTRAVENOUS
  Filled 2013-12-24 (×4): qty 40

## 2013-12-24 MED ORDER — IPRATROPIUM-ALBUTEROL 0.5-2.5 (3) MG/3ML IN SOLN
3.0000 mL | Freq: Four times a day (QID) | RESPIRATORY_TRACT | Status: DC
  Administered 2013-12-24 – 2013-12-28 (×16): 3 mL via RESPIRATORY_TRACT
  Filled 2013-12-24 (×19): qty 3

## 2013-12-24 MED ORDER — MORPHINE SULFATE 2 MG/ML IJ SOLN
1.0000 mg | INTRAMUSCULAR | Status: DC | PRN
  Administered 2013-12-24 – 2013-12-25 (×3): 0.5 mg via INTRAVENOUS
  Administered 2013-12-25 – 2013-12-27 (×3): 1 mg via INTRAVENOUS
  Administered 2013-12-28: 2 mg via INTRAVENOUS
  Filled 2013-12-24 (×7): qty 1

## 2013-12-24 MED ORDER — BUDESONIDE 0.25 MG/2ML IN SUSP
0.2500 mg | Freq: Two times a day (BID) | RESPIRATORY_TRACT | Status: DC
  Administered 2013-12-24 – 2013-12-28 (×9): 0.25 mg via RESPIRATORY_TRACT
  Filled 2013-12-24 (×13): qty 2

## 2013-12-24 MED ORDER — SODIUM CHLORIDE 0.9 % IV SOLN
250.0000 mg | Freq: Three times a day (TID) | INTRAVENOUS | Status: DC
  Administered 2013-12-24 – 2013-12-28 (×12): 250 mg via INTRAVENOUS
  Filled 2013-12-24 (×14): qty 250

## 2013-12-24 MED ORDER — TIZANIDINE HCL 2 MG PO TABS
2.0000 mg | ORAL_TABLET | Freq: Every evening | ORAL | Status: DC | PRN
  Filled 2013-12-24: qty 1

## 2013-12-24 MED ORDER — ACETYLCYSTEINE 20 % IN SOLN
2.0000 mL | RESPIRATORY_TRACT | Status: DC | PRN
  Administered 2013-12-24: 2 mL via RESPIRATORY_TRACT
  Filled 2013-12-24: qty 4

## 2013-12-24 MED ORDER — BACLOFEN 10 MG PO TABS: 10.0000 mg | ORAL_TABLET | Freq: Four times a day (QID) | ORAL | Status: DC

## 2013-12-24 MED ORDER — POLYETHYLENE GLYCOL 3350 17 GM/SCOOP PO POWD
17.0000 g | Freq: Every day | ORAL | Status: DC | PRN
  Filled 2013-12-24: qty 255

## 2013-12-24 MED ORDER — HEPARIN SODIUM (PORCINE) 5000 UNIT/ML IJ SOLN
5000.0000 [IU] | Freq: Three times a day (TID) | INTRAMUSCULAR | Status: DC
  Administered 2013-12-24 – 2013-12-28 (×13): 5000 [IU] via SUBCUTANEOUS
  Filled 2013-12-24 (×13): qty 1

## 2013-12-24 MED ORDER — PRIMIDONE 50 MG PO TABS
200.0000 mg | ORAL_TABLET | Freq: Four times a day (QID) | ORAL | Status: DC
  Administered 2013-12-24 – 2013-12-25 (×3): 200 mg
  Filled 2013-12-24 (×7): qty 4

## 2013-12-24 MED ORDER — SODIUM CHLORIDE 0.9 % IV SOLN: 250.0000 mL | INTRAVENOUS | Status: DC | PRN

## 2013-12-24 MED ORDER — DEXTROSE 5 % IV SOLN
500.0000 mg | INTRAVENOUS | Status: DC
  Administered 2013-12-24 – 2013-12-27 (×4): 500 mg via INTRAVENOUS
  Filled 2013-12-24 (×5): qty 500

## 2013-12-24 MED ORDER — BACLOFEN 10 MG PO TABS
10.0000 mg | ORAL_TABLET | Freq: Two times a day (BID) | ORAL | Status: DC
  Administered 2013-12-24: 20 mg via ORAL
  Filled 2013-12-24: qty 1

## 2013-12-24 MED ORDER — ALBUTEROL SULFATE (2.5 MG/3ML) 0.083% IN NEBU
2.5000 mg | INHALATION_SOLUTION | RESPIRATORY_TRACT | Status: DC | PRN
  Administered 2013-12-24 – 2013-12-27 (×3): 2.5 mg via RESPIRATORY_TRACT
  Filled 2013-12-24 (×3): qty 3

## 2013-12-24 NOTE — Progress Notes (Signed)
eLink Physician-Brief Progress Note Patient Name: Renee [name] DOB: Jun 22, 1986 MRN: [id number]  Date of Service  12/24/2013   HPI/Events of Note  Poor IV access   eICU Interventions  PICC ordered   Intervention Category Minor Interventions: Routine modifications to care plan (e.g. PRN medications for pain, fever)  Wilhelmina Mcardle 12/24/2013, 5:47 PM

## 2013-12-24 NOTE — Progress Notes (Addendum)
ANTIBIOTIC CONSULT NOTE - INITIAL  Pharmacy Consult for: Primaxin (Rx)/Azithromycin (MD) Indication: Pneumonia   Allergies  Allergen Reactions  . Other     Paper and adhesive tape - blisters  . Penicillins     Patient Measurements:     Vital Signs: Temp: 97.7 F (36.5 C) (06/04 1200) Temp src: Axillary (06/04 1200) BP: 124/76 mmHg (06/04 1048) Pulse Rate: 104 (06/04 1048) Intake/Output from previous day:   Intake/Output from this shift:    Labs: No results found for this basename: WBC, HGB, PLT, LABCREA, CREATININE,  in the last 72 hours The CrCl is unknown because both a height and weight (above a minimum accepted value) are required for this calculation. No results found for this basename: VANCOTROUGH, VANCOPEAK, VANCORANDOM, GENTTROUGH, GENTPEAK, GENTRANDOM, TOBRATROUGH, TOBRAPEAK, TOBRARND, AMIKACINPEAK, AMIKACINTROU, AMIKACIN,  in the last 72 hours   Microbiology: No results found for this or any previous visit (from the past 720 hour(s)).  Medical History: Past Medical History  Diagnosis Date  . Cerebral palsy   . Mental retardation   . Hydrocephalus   . Seizure disorder   . Osteopenia   . Hip dysplasia     bilateral  . DNR (do not resuscitate)   . DNI (do not intubate)   . Seizures     Medications:  Scheduled:  . azithromycin  500 mg Intravenous Q24H  . baclofen  10 mg Oral BID  . baclofen  20 mg Per Tube TID  . budesonide  0.25 mg Nebulization BID  . heparin  5,000 Units Subcutaneous 3 times per day  . ipratropium-albuterol  3 mL Nebulization Q6H  . pantoprazole (PROTONIX) IV  40 mg Intravenous QHS  . primidone  200 mg Per Tube QID  . [name]  0.5 mg Per Tube BID   Infusions:  . dextrose 5 % and 0.45% NaCl     PRN: sodium chloride, acetylcysteine, albuterol, polyethylene glycol powder, tiZANidine Assessment:  28 yo F with CP (full care at home) admitted from physicians office 6/4 for pneumonia and hypoxia.  Starting azithromycin for  atypical PNA coverage, primaxin for broad spectrum and anaerobic coverage, patient has a PCN allergy with unknown reaction (Low cross-reactivity with carbapenems).    *Patient weight 25.7 kg*   Antimicrobials 6/4 >> Primaxin >>  6/4 >> Azithromycin >>   Labs -- Pending, will f/u  WBC: Temp: Renal:   Micro 6/4 Blood x 2: Sent 6/4 Respiratory Culture - ordered   Goal of Therapy:  Primaxin per renal function   Plan:  1.) Zosyn consult discontinued, Primaxin started instead for anaerobic/gram negative coverage given lower cross-reactivity with PCN allergy - Spoke with Dr. Chase Caller  2.) Primaxin 250 mg IV q8h 3.) Azithromycin 500 mg IV Q24h IV per MD 4.) f/u cultures, f/u narrowing antibiotic therapy, monitor renal function    Gaye Alken Elaysia Devargas PharmD Pager #: [phone] 2:20 PM 12/24/2013

## 2013-12-24 NOTE — Progress Notes (Signed)
Updated Dr. Alva Garnet regarding condition. Noted orders for Morphine. Dr. Alva Garnet spoke with pt's mother on the phone.

## 2013-12-24 NOTE — Progress Notes (Signed)
[facility] Progress Note Patient Name: Renee [name] DOB: 1986-01-29 MRN: [id number]  Date of Service  12/24/2013   HPI/Events of Note  Resp distress. DNR. 100 NRB. Poor candidate for BiPAP   eICU Interventions  Low dose MSO4 ordered   Intervention Category Major Interventions: Respiratory failure - evaluation and management Minor Interventions: Routine modifications to care plan (e.g. PRN medications for pain, fever)  Wilhelmina Mcardle 12/24/2013, 5:48 PM

## 2013-12-24 NOTE — Progress Notes (Signed)
   Subjective:    Patient ID: Renee [name], female    DOB: 09/01/1985, 28 y.o.   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  She is DNR/DNI.  12/24/2013 Acute OV  Pt is accompanied by grandma and aunt.  Complains of 2 days of increased congestion, rattling in chest , low grade fever, dyspnea and very lethargic.  O2 sat is 83% upon arrival on room.  Gets bolus feeds in peg, no residuals.  Mild constipation , takes miralax  Mom, Stanton Kidney, is on way home from [place]. Spoke with her on phone , aware of need for hospital admit . Pt is DNR/DNI .      Review of Systems  Constitutional:   No  weight loss, night sweats,  + Fevers, chills, fatigue, or  lassitude.  HEENT:   No headaches,  Difficulty swallowing,  Tooth/dental problems, or  Sore throat,                No sneezing, itching, ear ache, + nasal congestion, post nasal drip,   CV:  No chest pain,  Orthopnea, PND, swelling in lower extremities, anasarca, dizziness, palpitations, syncope.   GI  No heartburn, indigestion, abdominal pain, nausea, vomiting, diarrhea, change in bowel habits, loss of appetite, bloody stools.   Resp:    No chest wall deformity  Skin: no rash or lesions.  GU: no dysuria, change in color of urine, no urgency or frequency.  No flank pain, no hematuria   MS:  No joint pain or swelling.  No decreased range of motion.  No back pain.  Psych:  No change in mood or affect. No depression or anxiety.  No memory loss.          Objective:   Physical Exam  General - Thin, moaning, lethargic , agitated  HEENT -, ++ nasal discharge, no oral exudate Cardiac - s1s2 regular, no murmur Chest - Coarse Rhonchi bilaterally  Abdomen - soft, PEG tube in place Extremities - no edema, contracted Neurologic - awake, non-verbal, agitated         Assessment & Plan:

## 2013-12-24 NOTE — H&P (Signed)
PULMONARY / CRITICAL CARE MEDICINE   Name: Renee [name] MRN: [id number] DOB: [birth year]    ADMISSION DATE:  (Not on file)  PRIMARY SERVICE: PCCM   CHIEF COMPLAINT:  Resp Distress, Hypoxia   BRIEF PATIENT DESCRIPTION: 28 yo CP pt of VS admitted from office with probable PNA and acute on chronic hypoxic Resp Failure . She is a DNR/DNI. She is cared for by family at home. Very supportive family.    SIGNIFICANT EVENTS / STUDIES:  6/4 Admit from office   LINES / TUBES: Chronic Peg   CULTURES:   ANTIBIOTICS:   HISTORY OF PRESENT ILLNESS:   Presented to pulmonary clinic on 12/24/2013 for an acute visit.  Pt is accompanied by grandma and aunt.  Reports  2 days of increased congestion, rattling in chest , low grade fever, dyspnea and very lethargic.  O2 sat is 83% upon arrival on room.  Gets bolus feeds in peg, no residuals.  Mild constipation , takes miralax  Mom, Stanton Kidney, is on way home from [place]. Spoke with her on phone , aware of need for hospital admit . Pt is DNR/DNI .     PAST MEDICAL HISTORY :  Past Medical History  Diagnosis Date  . Cerebral palsy   . Mental retardation   . Hydrocephalus   . Seizure disorder   . Osteopenia   . Hip dysplasia     bilateral  . DNR (do not resuscitate)   . DNI (do not intubate)   . Seizures    Past Surgical History  Procedure Laterality Date  . Spinal fusion  2005  . 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 Authorizing Provider  acetylcysteine (MUCOMYST) 20 % nebulizer solution Take 2 mLs by nebulization every 4 (four) hours as needed. 05/01/13   [name], MD  albuterol (PROVENTIL) (2.5 MG/3ML) 0.083% nebulizer solution Take 2.5 mg by nebulization every 6 (six) hours as needed. 05/01/13   [name], MD  baclofen (LIORESAL) 10 MG tablet TAKE 2 TABLETS IN THE MORNING, 1 AT 11AM, 2 IN THE AFTERNOON, 1 AT 6 PM AND 2 AT BEDTIME. 11/27/13   Rockwell Germany, NP   budesonide (PULMICORT) 0.25 MG/2ML nebulizer solution Take 2 mLs (0.25 mg total) by nebulization 2 (two) times daily. 05/01/13   [name], MD  ibuprofen (ADVIL,MOTRIN) 100 MG/5ML suspension 200 mg every 4 (four) hours as needed. Per bottle. Via tube    Historical Provider, MD  medroxyPROGESTERone (DEPO-PROVERA) 150 MG/ML injection Inject 1 mL (150 mg total) into the muscle every 3 (three) months. 10/16/13   Estill Dooms, NP  Nutritional Supplements (JEVITY) LIQD 5 cans daily with 8 ounces of water flush per G- tube after each can.    Historical Provider, MD  Phenylephrine-Guaifenesin (MUCINEX COLD/KIDS) 2.5-100 MG/5ML LIQD 20 ml per G-tube as needed for congestion.    Historical Provider, MD  polyethylene glycol powder (MIRALAX) powder Take 17 g by mouth daily as needed.     Historical Provider, MD  primidone (MYSOLINE) 50 MG tablet TAKE 4 TABLETS EVERY MORNING,4 TABLETS IN THE AFTERNOON, AND 4 TABLETS IN THE EVENING. 11/24/13   Rockwell Germany, NP  [name] (RISPERDAL) 0.5 MG tablet TAKE (1) TABLET VIA G TUBE IN THE MORNING AND (2) AT BEDTIME. 11/24/13   Rockwell Germany, NP  Simethicone (GAS-X INFANT DROPS) 40 MG/0.6ML LIQD 3 (three) times daily. Via g tube    Historical  Provider, MD  tiZANidine (ZANAFLEX) 2 MG tablet CRUSH 1 TABLET IN G-TUBE AT BEDTIME AS NEEDED FOR SPASMS.MAY INCREASE TO 1 AT BEDTIME IF NEEDED. 11/24/13   Rockwell Germany, NP   Allergies  Allergen Reactions  . Other     Paper and adhesive tape - blisters  . Penicillins     FAMILY HISTORY:  No family history on file. SOCIAL HISTORY:  reports that she has never smoked. She has never used smokeless tobacco. She reports that she does not drink alcohol or use illicit drugs.  REVIEW OF SYSTEMS:   Constitutional: No weight loss, night sweats,  + Fevers, chills, fatigue, or lassitude.  HEENT: No headaches, Difficulty swallowing, Tooth/dental problems, or Sore throat,  No sneezing, itching, ear ache,  + nasal  congestion, post nasal drip,  CV: No chest pain, Orthopnea, PND, swelling in lower extremities, anasarca, dizziness, palpitations, syncope.  GI No heartburn, indigestion, abdominal pain, nausea, vomiting, diarrhea, change in bowel habits, loss of appetite, bloody stools.  Resp: No chest wall deformity  Skin: no rash or lesions.  GU: no dysuria, change in color of urine, no urgency or frequency. No flank pain, no hematuria  MS: No joint pain or swelling. No decreased range of motion. No back pain.  Psych: No change in mood or affect. No depression or anxiety. No memory loss.  SUBJECTIVE:  Resp distress/lethargic   VITAL SIGNS: Temp:  [98.3 F (36.8 C)] 98.3 F (36.8 C) (06/04 1048) Pulse Rate:  [104] 104 (06/04 1048) BP: (124)/(76) 124/76 mmHg (06/04 1048) SpO2:  [87 %] 87 % (06/04 1048) HEMODYNAMICS:   VENTILATOR SETTINGS:   INTAKE / OUTPUT: Intake/Output   None     PHYSICAL EXAMINATION: General - Thin, moaning, lethargic , agitated  HEENT -, ++ nasal discharge, no oral exudate  Cardiac - s1s2 regular, no murmur  Chest - Coarse Rhonchi bilaterally  Abdomen - soft, PEG tube in place  Extremities - no edema, contracted  Neurologic - awake, non-verbal, agitated   LABS:  CBC No results found for this basename: WBC, HGB, HCT, PLT,  in the last 168 hours Coag's No results found for this basename: APTT, INR,  in the last 168 hours BMET No results found for this basename: NA, K, CL, CO2, BUN, CREATININE, GLUCOSE,  in the last 168 hours Electrolytes No results found for this basename: CALCIUM, MG, PHOS,  in the last 168 hours Sepsis Markers No results found for this basename: LATICACIDVEN, PROCALCITON, O2SATVEN,  in the last 168 hours ABG No results found for this basename: PHART, PCO2ART, PO2ART,  in the last 168 hours Liver Enzymes No results found for this basename: AST, ALT, ALKPHOS, BILITOT, ALBUMIN,  in the last 168 hours Cardiac Enzymes No results found for this  basename: TROPONINI, PROBNP,  in the last 168 hours Glucose No results found for this basename: GLUCAP,  in the last 168 hours  Imaging No results found.   CXR: pending   ASSESSMENT / PLAN:  PULMONARY A: Acute on chronic Hypoxic Resp Failure (DNR/DNI )  Suspected PNA -recurrent in setting of CP and poor cough mechanics  P:   Admit to SDU  Begin IV Abx  BD Nebs  Cxr today  O2 to keep sats >90%  CARDIOVASCULAR A:  P:  Tele monitoring   RENAL A:   P:   Labs pending  Replace electrolytes as indicated   GASTROINTESTINAL A:  Chronic Peg  Constipation   P:   Cont TF -dietician consult  Miralax    HEMATOLOGIC A:   P:  Labs pending   INFECTIOUS A:  Suspected PNA  P:   Begin IV abx  CXR pending  ENDOCRINE A:   P:    NEUROLOGIC A:  CP, nonverbal  P:   Supportive care    TODAY'S SUMMARY: 28 yo CP pt (full care at home )  admitted from office for Pneumonia and Hypoxia .   I have personally obtained a history, examined the patient, evaluated laboratory and imaging results, formulated the assessment and plan and placed orders.  CRITICAL CARE: The patient is critically ill with multiple organ systems failure and requires high complexity decision making for assessment and support, frequent evaluation and titration of therapies, application of advanced monitoring technologies and extensive interpretation of multiple databases. Critical Care Time devoted to patient care services described in this note is 45 minutes.   Melvenia Needles NP-C Pulmonary and Critical Care Medicine [facility] Pager: [phone] 12/24/2013, 11:38 AM  Baltazar Apo, MD, PhD 12/24/2013, 12:56 PM [place] [facility] [phone] or if no answer [phone]

## 2013-12-24 NOTE — Assessment & Plan Note (Signed)
Suspected PNA-recurrent  Will need admission for further tx   Plan  Admit to [facility] , SDU  See H/P note

## 2013-12-24 NOTE — Progress Notes (Signed)
[facility] notified of decreased O2 sats on 100% NRB. Await Md orders.

## 2013-12-25 ENCOUNTER — Inpatient Hospital Stay (HOSPITAL_COMMUNITY): Payer: 59

## 2013-12-25 DIAGNOSIS — J690 Pneumonitis due to inhalation of food and vomit: Principal | ICD-10-CM

## 2013-12-25 DIAGNOSIS — E43 Unspecified severe protein-calorie malnutrition: Secondary | ICD-10-CM | POA: Diagnosis present

## 2013-12-25 DIAGNOSIS — J9620 Acute and chronic respiratory failure, unspecified whether with hypoxia or hypercapnia: Secondary | ICD-10-CM

## 2013-12-25 DIAGNOSIS — E46 Unspecified protein-calorie malnutrition: Secondary | ICD-10-CM

## 2013-12-25 DIAGNOSIS — G808 Other cerebral palsy: Secondary | ICD-10-CM

## 2013-12-25 LAB — BASIC METABOLIC PANEL
BUN: 4 mg/dL — ABNORMAL LOW (ref 6–23)
CALCIUM: 8.7 mg/dL (ref 8.4–10.5)
CO2: 32 mEq/L (ref 19–32)
Chloride: 100 mEq/L (ref 96–112)
Creatinine, Ser: 0.23 mg/dL — ABNORMAL LOW (ref 0.50–1.10)
GFR calc Af Amer: >90 mL/min (ref >90)
GFR calc non Af Amer: >90 mL/min (ref >90)
GLUCOSE: 94 mg/dL (ref 70–99)
Potassium: 4 mEq/L (ref 3.7–5.3)
Sodium: 142 mEq/L (ref 137–147)

## 2013-12-25 LAB — CBC
HCT: 45.4 % (ref 36.0–46.0)
Hemoglobin: 14.8 g/dL (ref 12.0–15.0)
MCH: 34.1 pg — AB (ref 26.0–34.0)
MCHC: 32.6 g/dL (ref 30.0–36.0)
MCV: 104.6 fL — AB (ref 78.0–100.0)
Platelets: 177 10*3/uL (ref 150–400)
RBC: 4.34 MIL/uL (ref 3.87–5.11)
RDW: 13.4 % (ref 11.5–15.5)
WBC: 9.7 10*3/uL (ref 4.0–10.5)

## 2013-12-25 LAB — GLUCOSE, CAPILLARY
GLUCOSE-CAPILLARY: 136 mg/dL — AB (ref 70–99)
Glucose-Capillary: 163 mg/dL — ABNORMAL HIGH (ref 70–99)
Glucose-Capillary: 113 mg/dL — ABNORMAL HIGH (ref 70–99)
Glucose-Capillary: 117 mg/dL — ABNORMAL HIGH (ref 70–99)

## 2013-12-25 MED ORDER — TIZANIDINE HCL 2 MG PO TABS
2.0000 mg | ORAL_TABLET | Freq: Every evening | ORAL | Status: DC
  Administered 2013-12-25 – 2013-12-27 (×3): 2 mg
  Filled 2013-12-25 (×4): qty 1

## 2013-12-25 MED ORDER — FREE WATER
60.0000 mL | Freq: Four times a day (QID) | Status: DC
  Administered 2013-12-25 – 2013-12-28 (×13): 60 mL

## 2013-12-25 MED ORDER — LIDOCAINE HCL 1 % IJ SOLN
INTRAMUSCULAR | Status: AC
  Filled 2013-12-25: qty 20

## 2013-12-25 MED ORDER — JEVITY 1.5 CAL/FIBER PO LIQD
237.0000 mL | Freq: Two times a day (BID) | ORAL | Status: DC
  Administered 2013-12-26: 237 mL
  Filled 2013-12-25 (×3): qty 1000

## 2013-12-25 MED ORDER — ADULT MULTIVITAMIN LIQUID CH
5.0000 mL | Freq: Every day | ORAL | Status: DC
  Administered 2013-12-25 – 2013-12-28 (×4): 5 mL
  Filled 2013-12-25 (×4): qty 5

## 2013-12-25 MED ORDER — FENTANYL CITRATE 0.05 MG/ML IJ SOLN
INTRAMUSCULAR | Status: AC
  Filled 2013-12-25: qty 2

## 2013-12-25 MED ORDER — ACETAMINOPHEN 325 MG PO TABS
325.0000 mg | ORAL_TABLET | Freq: Four times a day (QID) | ORAL | Status: DC | PRN
  Administered 2013-12-25 – 2013-12-27 (×2): 325 mg
  Filled 2013-12-25 (×2): qty 1

## 2013-12-25 MED ORDER — OSMOLITE 1.5 CAL PO LIQD
237.0000 mL | Freq: Two times a day (BID) | ORAL | Status: DC
  Administered 2013-12-25 (×2): 237 mL
  Administered 2013-12-26: 11:00:00
  Filled 2013-12-25 (×3): qty 237

## 2013-12-25 MED ORDER — BACLOFEN 10 MG PO TABS
10.0000 mg | ORAL_TABLET | Freq: Every day | ORAL | Status: DC
  Administered 2013-12-25 – 2013-12-28 (×4): 10 mg via ORAL
  Filled 2013-12-25 (×5): qty 1

## 2013-12-25 MED ORDER — IOHEXOL 300 MG/ML  SOLN
10.0000 mL | Freq: Once | INTRAMUSCULAR | Status: AC | PRN
  Administered 2013-12-25: 10 mL via INTRAVENOUS

## 2013-12-25 MED ORDER — PRIMIDONE 50 MG PO TABS
200.0000 mg | ORAL_TABLET | Freq: Three times a day (TID) | ORAL | Status: DC
  Administered 2013-12-25 – 2013-12-28 (×10): 200 mg
  Filled 2013-12-25 (×12): qty 4

## 2013-12-25 MED ORDER — FENTANYL CITRATE 0.05 MG/ML IJ SOLN
50.0000 ug | Freq: Once | INTRAMUSCULAR | Status: AC
  Administered 2013-12-25: 50 ug via INTRAVENOUS

## 2013-12-25 NOTE — Progress Notes (Signed)
Reviewed and agree with plan for hospital admission.

## 2013-12-25 NOTE — Progress Notes (Signed)
CARE MANAGEMENT NOTE 12/25/2013  Patient:  Renee [name], Renee [name]   Account Number:  [id number]  Date Initiated:  12/25/2013  Documentation initiated by:  Jeydan Barner  Subjective/Objective Assessment:   patient with hx of cerebral palsy and mental retardation, admitted from [place] with pna and resp distress     Action/Plan:   tbd has been at home with family who is very involved in care   Anticipated DC Date:  12/28/2013   Anticipated DC Plan:  [place]  In-house referral  NA      DC Planning Services  NA      [facility] Choice  NA   Choice offered to / List presented to:  NA   DME arranged  NA      DME agency  NA     [place] arranged  NA      [place] agency  NA   Status of service:  In process, will continue to follow Medicare Important Message given?  NA - LOS <3 / Initial given by admissions (If response is "NO", the following Medicare IM given date fields will be blank) Date Medicare IM given:   Date Additional Medicare IM given:    Discharge Disposition:    Per UR Regulation:  Reviewed for med. necessity/level of care/duration of stay  If discussed at [place] of Stay Meetings, dates discussed:    Comments:  [id number]/Jamesmichael Shadd Eldridge Dace, [place], [place] [phone] Chart Reviewed for discharge and hospital needs. Discharge needs at time of review: None present will follow for needs. Patient may require placement to snf or hospice at this point will follow. Review of patient progress due on [id number].

## 2013-12-25 NOTE — Progress Notes (Signed)
Pt seen for CPT ordered q4 hours.  Pt is resting well at this time.  Mom at bedside asked that I just let her rest.  CPT not done at this time.  RN aware.

## 2013-12-25 NOTE — Progress Notes (Signed)
Pt was awake and mom was bedside (head in hands) when I arrived. During the visit mom admitted she was tired. Pt, although non-verbal, was responsive when spoken to. Pt's mom wanted prayer before I left and was very appreciative of pastoral support. Ernest Haber Chaplain  12/25/13 1400  Clinical Encounter Type  Visited With Patient and family together

## 2013-12-25 NOTE — Progress Notes (Signed)
INITIAL NUTRITION ASSESSMENT  Pt meets criteria for severe MALNUTRITION in the context of chronic illness as evidenced by severe muscle wasting and subcutaneous fat loss in upper arms.  DOCUMENTATION CODES Per approved criteria  -Severe malnutrition in the context of chronic illness   INTERVENTION: - Start TF via PEG of 2 cans of Jevity 1.5 daily and 2 cans of Osmolite 1.5 daily to see if switching to lower fiber formula can help with pt's TF tolerance. Will order 71m of water before and after each bolus administration. Total of 4 cans will provide 1420 calories, 60g protein, 7260mfree water and meet 100% of estimated calorie and protein needs  - Initiate adult enteral protocol - Multivitamin 69m37maily via PEG - Recommend case manager consult as pt's mother looking to find GI in [place]  - RD to continue to monitor  NUTRITION DIAGNOSIS: Inadequate oral intake related to inability to eat as evidenced by NPO.   Goal: TF to meet >90% of estimated nutritional needs  Monitor:  Weights, labs, TF tolerance  Reason for Assessment: Underweight, low braden   [age]o. female  Admitting Dx: Aspiration pneumonia  ASSESSMENT: Pt with cerebral palsy and mental retardation with PEG. Admitted with 2 days of increased congestion, rattling in chest, and concern for probable pneumonia and acute on chronic hypoxic respiratory failure.   -Had long talk with mother about pt's home TF regimen -Pt has had PEG x 10 years and has been on Jevity 1.5 during that time -Used to get 5 cans but in the past year mother has noticed pt hasn't been tolerating it as well and pt has been getting 4 cans/day, sometimes less -Mother reports pt gets entire can of Jevity 1.5 at 7am and 3pm, but only 4 oz at 11am and 6pm due to intolerance which she describes as pt having increased coughing so she only gives her half of the can -Gives pt 3oz of water with each can and 1-2 oz of water flushes after each can   -States pt's weight is up 8-10 pounds since October -Does not eat anything by mouth -Hx of severe constipation in February of this year, [facility] -Mixes 2 tablespoons of Benefiber with TF and if pt doesn't have a BM in 2 days, gives pt Miralax -Reports pt had a small BM this morning -Did not get any TF yesterday -Reports the most pt's residuals have been at home have been 1 ounce  -Pt visibly cachetic, currently on non-rebreather -Obtained verbal order from NP to manage TF   AST/ALT elevated Alk phos elevated  Goal rate of 4 cans of Jevity 1.5 at home provides 1420 calories, 60g protein, 720m78mee water with at least 4oz of water QID via tube for a total of 1200ml50mer which meets 100% of estimated calorie and protein needs   Recent average intake of 3 cans of Jevity 1.5 provides 1065 calories, 45g protein, 540ml 34m water 84% estimated calorie needs, 100% estimated protein needs   Height: Ht Readings from Last 1 Encounters:  12/24/13 4' (1.219 m)    Weight: Wt Readings from Last 1 Encounters:  12/25/13 61 lb 15.2 oz (28.1 kg)    Ideal Body Weight: 80 lbs   % Ideal Body Weight: 76%  Wt Readings from Last 10 Encounters:  12/25/13 61 lb 15.2 oz (28.1 kg)  11/24/13 60 lb (27.216 kg)  09/22/13 55 lb (24.948 kg)  06/03/13 48 lb (21.773 kg)  05/01/13 48  lb (21.773 kg)  10/17/12 48 lb (21.773 kg)  04/14/12 48 lb (21.773 kg)  10/17/11 50 lb (22.68 kg)  04/17/11 52 lb (23.587 kg)  09/20/10 52 lb (23.587 kg)    Usual Body Weight: 51-53 lbs per mother  % Usual Body Weight: 115-120%  BMI:  Body mass index is 18.91 kg/(m^2).  Estimated Nutritional Needs: Kcal: 1260-1460  Protein: 42-60g Fluid: per MD   Skin: Stage 1 pressure ulcer on right and left ankle  Diet Order: NPO  EDUCATION NEEDS: -No education needs identified at this time   Intake/Output Summary (Last 24 hours) at 12/25/13 0945 Last data filed at 12/25/13 0900  Gross per 24 hour   Intake 1816.25 ml  Output     20 ml  Net 1796.25 ml    Last BM: 6/4   Labs:   Recent Labs Lab 12/24/13 1502 12/25/13 0334  NA 139 142  K 4.2 4.0  CL 97 100  CO2 31 32  BUN 8 4*  CREATININE <0.20* 0.23*  CALCIUM 9.1 8.7  MG 2.1  --   PHOS 3.7  --   GLUCOSE 78 94    CBG (last 3)  No results found for this basename: GLUCAP,  in the last 72 hours  Scheduled Meds: . azithromycin  500 mg Intravenous Q24H  . baclofen  10 mg Oral BID  . baclofen  20 mg Per Tube TID  . budesonide  0.25 mg Nebulization BID  . heparin  5,000 Units Subcutaneous 3 times per day  . imipenem-cilastatin  250 mg Intravenous 3 times per day  . ipratropium-albuterol  3 mL Nebulization Q6H  . pantoprazole (PROTONIX) IV  40 mg Intravenous QHS  . primidone  200 mg Per Tube 3 times per day  . [name]  0.5 mg Per Tube BID    Continuous Infusions: . dextrose 5 % and 0.45% NaCl 75 mL/hr at 12/25/13 0900    Past Medical History  Diagnosis Date  . Cerebral palsy   . Mental retardation   . Hydrocephalus   . Seizure disorder   . Osteopenia   . Hip dysplasia     bilateral  . DNR (do not resuscitate)   . DNI (do not intubate)   . Seizures     Past Surgical History  Procedure Laterality Date  . Spinal fusion  2005  . 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

## 2013-12-25 NOTE — H&P (Signed)
PULMONARY / CRITICAL CARE MEDICINE   Name: Renee [name] MRN: [id number] DOB: 10-09-1985    ADMISSION DATE:  12/24/2013  PRIMARY SERVICE: PCCM   CHIEF COMPLAINT:  Resp Distress, Hypoxia   BRIEF PATIENT DESCRIPTION: 28 yo CP pt of VS admitted from office with probable PNA and acute on chronic hypoxic Resp Failure . She is a DNR/DNI. She is cared for by family at home. Very supportive family.    SIGNIFICANT EVENTS / STUDIES:  6/4 Admit from office   LINES / TUBES: Chronic Peg   CULTURES: 6/4 bc>> 6/4 resp>>  ANTIBIOTICS: 6/4 primaxin>> 6/4 azithro>>  HISTORY OF PRESENT ILLNESS:   Presented to pulmonary clinic on 12/24/2013 for an acute visit.  Pt is accompanied by grandma and aunt.  Reports  2 days of increased congestion, rattling in chest , low grade fever, dyspnea and very lethargic.  O2 sat is 83% upon arrival on room.  Gets bolus feeds in peg, no residuals.  Mild constipation , takes miralax   . Pt is DNR/DNI .   SUBJECTIVE:  Resp distress/lethargic, but some improvement compared with 6/4  VITAL SIGNS: Temp:  [97.1 F (36.2 C)-98 F (36.7 C)] 97.8 F (36.6 C) (06/05 0800) Pulse Rate:  [80-118] 96 (06/05 1000) Resp:  [10-28] 18 (06/05 1000) BP: (81-126)/(35-79) 100/52 mmHg (06/05 1000) SpO2:  [78 %-97 %] 94 % (06/05 1000) FiO2 (%):  [100 %] 100 % (06/05 1041) Weight:  [25.719 kg (56 lb 11.2 oz)-28.1 kg (61 lb 15.2 oz)] 28.1 kg (61 lb 15.2 oz) (06/05 0500) HEMODYNAMICS:   VENTILATOR SETTINGS: Vent Mode:  [-]  FiO2 (%):  [100 %] 100 % INTAKE / OUTPUT: Intake/Output     06/04 0701 - 06/05 0700 06/05 0701 - 06/06 0700   P.O. 60    I.V. (mL/kg) 1056.3 (37.6) 150 (5.3)   IV Piggyback 550    Total Intake(mL/kg) 1666.3 (59.3) 150 (5.3)   Urine (mL/kg/hr) 20    Total Output 20     Net +1646.3 +150        Urine Occurrence 5 x    Stool Occurrence 2 x      PHYSICAL EXAMINATION: General - Thin, contracted, moves right in agitated fashion HEENT -, ++ nasal  discharge, no oral exudate  Cardiac - s1s2 regular, no murmur  Chest - Coarse Rhonchi bilaterally  Abdomen - soft, PEG tube in place  Extremities - no edema, contracted  Neurologic - awake, non-verbal, agitated   LABS:  CBC  Recent Labs Lab 12/24/13 1502 12/25/13 0334  WBC 8.4 9.7  HGB 16.4* 14.8  HCT 49.5* 45.4  PLT 204 177   Coag's No results found for this basename: APTT, INR,  in the last 168 hours BMET  Recent Labs Lab 12/24/13 1502 12/25/13 0334  NA 139 142  K 4.2 4.0  CL 97 100  CO2 31 32  BUN 8 4*  CREATININE <0.20* 0.23*  GLUCOSE 78 94   Electrolytes  Recent Labs Lab 12/24/13 1502 12/25/13 0334  CALCIUM 9.1 8.7  MG 2.1  --   PHOS 3.7  --    Sepsis Markers  Recent Labs Lab 12/24/13 1501  PROCALCITON <0.10   ABG No results found for this basename: PHART, PCO2ART, PO2ART,  in the last 168 hours Liver Enzymes  Recent Labs Lab 12/24/13 1502  AST 59*  ALT 58*  ALKPHOS 151*  BILITOT <0.2*  ALBUMIN 3.3*   Cardiac Enzymes No results found for this basename: TROPONINI, PROBNP,  in the last 168 hours Glucose No results found for this basename: GLUCAP,  in the last 168 hours  Imaging Dg Chest Port 1 View  12/24/2013   CLINICAL DATA:  Pneumonia.  EXAM: PORTABLE CHEST - 1 VIEW  COMPARISON:  September 24, 2013.  FINDINGS: Stable cardiomediastinal silhouette. Severe dextroscoliosis of thoracic spine is noted with Harrington rods in place. No pneumothorax or significant pleural effusion is noted. Right lung is clear. Left suprahilar interstitial density is noted which may represent early edema or possibly inflammation.  IMPRESSION: Mild left suprahilar interstitial opacity is noted concerning for early edema or possibly pneumonia. Followup radiographs are recommended.   Electronically Signed   By: Sabino Dick M.D.   On: 12/24/2013 14:23      ASSESSMENT / PLAN:  PULMONARY A: Acute on chronic Hypoxic Resp Failure (DNR/DNI )  Suspected PNA -recurrent  in setting of CP and poor cough mechanics  P:   Continue broad abx as below BD Nebs  Follow CXR O2 to keep sats >90% No plans intubation or ACLS  CARDIOVASCULAR A:  P:  Tele monitoring   RENAL A:   P:   Replace electrolytes as indicated   GASTROINTESTINAL A:  Chronic Peg  Constipation  P:   Cont TF -dietician consult 6/5 Miralax    HEMATOLOGIC A:   P:  Follow CBC  INFECTIOUS A:  Suspected LUL PNA (likely aspiration)  P:   Imipenem + azithro for atypical coverage  ENDOCRINE A:   P:    NEUROLOGIC A:  CP, nonverbal  P:   Supportive care    TODAY'S SUMMARY: 28 yo CP pt (full care at home )  admitted from office for Pneumonia and Hypoxia 6/4 .  6/5 IV access is an issue. May need CVL if PICC not possible.  Richardson Landry Minor ACNP [facility] PCCM Pager [phone] till 3 pm If no answer page [phone] 12/25/2013, 11:05 AM  Baltazar Apo, MD, PhD 12/25/2013, 12:03 PM [place] [facility] [phone] or if no answer [phone]

## 2013-12-25 NOTE — Procedures (Signed)
Successful placement of right brachial vein approach dual lumen PICC line with tip at the superior caval-atrial junction.  The PICC line is ready for immediate use. 

## 2013-12-26 ENCOUNTER — Inpatient Hospital Stay (HOSPITAL_COMMUNITY): Payer: 59

## 2013-12-26 DIAGNOSIS — E43 Unspecified severe protein-calorie malnutrition: Secondary | ICD-10-CM

## 2013-12-26 LAB — BASIC METABOLIC PANEL
BUN: 4 mg/dL — ABNORMAL LOW (ref 6–23)
CHLORIDE: 100 meq/L (ref 96–112)
CO2: 36 mEq/L — ABNORMAL HIGH (ref 19–32)
Calcium: 8.5 mg/dL (ref 8.4–10.5)
Creatinine, Ser: <0.2 mg/dL — ABNORMAL LOW (ref 0.50–1.10)
Glucose, Bld: 91 mg/dL (ref 70–99)
Potassium: 3.8 mEq/L (ref 3.7–5.3)
Sodium: 141 mEq/L (ref 137–147)

## 2013-12-26 LAB — GLUCOSE, CAPILLARY
GLUCOSE-CAPILLARY: 145 mg/dL — AB (ref 70–99)
GLUCOSE-CAPILLARY: 156 mg/dL — AB (ref 70–99)
GLUCOSE-CAPILLARY: 69 mg/dL — AB (ref 70–99)
Glucose-Capillary: 89 mg/dL (ref 70–99)
Glucose-Capillary: 119 mg/dL — ABNORMAL HIGH (ref 70–99)
Glucose-Capillary: 113 mg/dL — ABNORMAL HIGH (ref 70–99)

## 2013-12-26 LAB — CBC
HCT: 43.9 % (ref 36.0–46.0)
HEMOGLOBIN: 13.9 g/dL (ref 12.0–15.0)
MCH: 34.1 pg — ABNORMAL HIGH (ref 26.0–34.0)
MCHC: 31.7 g/dL (ref 30.0–36.0)
MCV: 107.6 fL — ABNORMAL HIGH (ref 78.0–100.0)
Platelets: 177 10*3/uL (ref 150–400)
RBC: 4.08 MIL/uL (ref 3.87–5.11)
RDW: 13.3 % (ref 11.5–15.5)
WBC: 7 10*3/uL (ref 4.0–10.5)

## 2013-12-26 LAB — MAGNESIUM: Magnesium: 2 mg/dL (ref 1.5–2.5)

## 2013-12-26 LAB — PHOSPHORUS: PHOSPHORUS: 3.5 mg/dL (ref 2.3–4.6)

## 2013-12-26 MED ORDER — OSMOLITE 1.5 CAL PO LIQD
237.0000 mL | Freq: Two times a day (BID) | ORAL | Status: DC
  Filled 2013-12-26: qty 237

## 2013-12-26 MED ORDER — JEVITY 1.5 CAL/FIBER PO LIQD
474.0000 mL | Freq: Two times a day (BID) | ORAL | Status: DC
  Filled 2013-12-26: qty 1000

## 2013-12-26 MED ORDER — OSMOLITE 1.5 CAL PO LIQD
237.0000 mL | Freq: Two times a day (BID) | ORAL | Status: DC
  Filled 2013-12-26 (×2): qty 237

## 2013-12-26 MED ORDER — OSMOLITE 1.5 CAL PO LIQD
237.0000 mL | Freq: Once | ORAL | Status: AC
  Administered 2013-12-26: 237 mL
  Filled 2013-12-26 (×2): qty 237

## 2013-12-26 MED ORDER — JEVITY 1.5 CAL/FIBER PO LIQD
237.0000 mL | Freq: Two times a day (BID) | ORAL | Status: DC
  Administered 2013-12-26: 237 mL
  Administered 2013-12-27: 08:00:00
  Filled 2013-12-26 (×5): qty 1000

## 2013-12-26 NOTE — Progress Notes (Signed)
ANTIBIOTIC CONSULT NOTE - Follow up  Pharmacy Consult for: Primaxin Indication: Pneumonia   Allergies  Allergen Reactions  . Other     Paper and adhesive tape - blisters  . Penicillins     Patient Measurements: Height: 4' (121.9 cm) Weight: 64 lb 2.5 oz (29.1 kg) IBW/kg (Calculated) : 17.9  Assessment: 28 yo F with Cerebral palsy (full care at home) admitted from physicians office 6/4 for pneumonia and hypoxia.  Starting azithromycin for atypical PNA coverage, primaxin for broad spectrum and anaerobic coverage, patient has a PCN allergy with unknown reaction (Low cross-reactivity with carbapenems).    Antimicrobials 6/4 >> Primaxin >>  6/4 >> Azithromycin >>   Labs Tmax: remains afebrile WBC: remains WNL Renal: SCr low d/t low muscle mass (<0.2), CrCl >100 (N, SCr 0.8), ~47 ml/min (CG, using SCr 0.8)  Micro 6/4 Blood x 2: ngtd 6/4 MRSA PCR: negative 6/4 Respiratory Culture: no organisms seen, pending  Goal of Therapy:  Primaxin per renal function   Plan:  - Continue Primaxin 250 mg IV q8h - Azithromycin 500 mg IV Q24h IV per MD - f/u cultures, f/u narrowing antibiotic therapy, monitor renal function    Thank you for the consult.  Johny Drilling, PharmD, BCPS Pager: [phone] Pharmacy: [phone] 12/26/2013 10:24 AM

## 2013-12-26 NOTE — Progress Notes (Signed)
Late entry: CPT not done around 0400 am due to pt resting comfortably and per mom's request.

## 2013-12-26 NOTE — Consult Note (Signed)
PULMONARY / CRITICAL CARE MEDICINE   Name: Renee [name] MRN: [id number] DOB: [birth year]    ADMISSION DATE:  12/24/2013  PRIMARY SERVICE: PCCM   CHIEF COMPLAINT:  Resp Distress, Hypoxia   BRIEF PATIENT DESCRIPTION: 28 yo CP pt of VS admitted from office with probable PNA and acute on chronic hypoxic Resp Failure. She is a DNR/DNI. She is cared for by family at home. Very supportive family.   SIGNIFICANT EVENTS / STUDIES:  6/4 Admit from office   LINES / TUBES: Chronic Peg   CULTURES: 6/4 bc>> 6/4 resp>>  ANTIBIOTICS: 6/4 primaxin>> 6/4 azithro>>  HISTORY OF PRESENT ILLNESS:   Presented to pulmonary clinic on 12/24/2013 for an acute visit.  Pt is accompanied by grandma and aunt.  Reports  2 days of increased congestion, rattling in chest , low grade fever, dyspnea and very lethargic.  O2 sat is 83% upon arrival on room.  Gets bolus feeds in peg, no residuals.  Mild constipation , takes miralax.  Pt is DNR/DNI .   SUBJECTIVE:  Severe desaturation overnight if 100% is not on, once off 100% dropped down in the 60's.  VITAL SIGNS: Temp:  [97.2 F (36.2 C)-99.2 F (37.3 C)] 97.2 F (36.2 C) (06/06 0800) Pulse Rate:  [80-107] 94 (06/06 0600) Resp:  [12-22] 14 (06/06 0600) BP: (93-125)/(44-69) 99/50 mmHg (06/06 0600) SpO2:  [76 %-94 %] 92 % (06/06 0821) FiO2 (%):  [100 %] 100 % (06/06 0213) Weight:  [64 lb 2.5 oz (29.1 kg)] 64 lb 2.5 oz (29.1 kg) (06/06 0545) HEMODYNAMICS:   VENTILATOR SETTINGS: Vent Mode:  [-]  FiO2 (%):  [100 %] 100 % INTAKE / OUTPUT: Intake/Output     06/05 0701 - 06/06 0700 06/06 0701 - 06/07 0700   P.O.     I.V. (mL/kg) 1800 (61.9)    Other 200    NG/GT 937    IV Piggyback 550    Total Intake(mL/kg) 3487 (119.8)    Urine (mL/kg/hr)     Total Output       Net +3487          Urine Occurrence 4 x 1 x   Stool Occurrence 1 x     PHYSICAL EXAMINATION: General - Thin, contracted, moves right in agitated fashion HEENT [place]/AT, PERRL,  EOM-spontaneous, MMM with increased secretion. Cardiac - s1s2 regular, no murmur  Chest - Transmitted upper airway sounds in all lung fields. Abdomen - soft, PEG tube in place  Extremities - no edema, contracted  Neurologic - awake, non-verbal, agitated   LABS:  CBC  Recent Labs Lab 12/24/13 1502 12/25/13 0334 12/26/13 0415  WBC 8.4 9.7 7.0  HGB 16.4* 14.8 13.9  HCT 49.5* 45.4 43.9  PLT 204 177 177   Coag's No results found for this basename: APTT, INR,  in the last 168 hours BMET  Recent Labs Lab 12/24/13 1502 12/25/13 0334 12/26/13 0415  NA 139 142 141  K 4.2 4.0 3.8  CL 97 100 100  CO2 31 32 36*  BUN 8 4* 4*  CREATININE <0.20* 0.23* <0.20*  GLUCOSE 78 94 91   Electrolytes  Recent Labs Lab 12/24/13 1502 12/25/13 0334 12/26/13 0415  CALCIUM 9.1 8.7 8.5  MG 2.1  --  2.0  PHOS 3.7  --  3.5   Sepsis Markers  Recent Labs Lab 12/24/13 1501  PROCALCITON <0.10   ABG No results found for this basename: PHART, PCO2ART, PO2ART,  in the last 168 hours Liver Enzymes  Recent Labs Lab 12/24/13 1502  AST 59*  ALT 58*  ALKPHOS 151*  BILITOT <0.2*  ALBUMIN 3.3*   Cardiac Enzymes No results found for this basename: TROPONINI, PROBNP,  in the last 168 hours Glucose  Recent Labs Lab 12/25/13 1216 12/25/13 1704 12/25/13 1941 12/25/13 2304 12/26/13 0358 12/26/13 0733  GLUCAP 136* 163* 113* 117* 89 119*    Imaging Ir Fluoro Guide Cv Line Right  12/25/2013   INDICATION: History of cerebral palsy, in need of intravenous access for medication administration  EXAM: ULTRASOUND AND FLUOROSCOPIC GUIDED PICC LINE INSERTION  MEDICATIONS: Fentanyl 25 mcg IV  CONTRAST:  5 cc Omnipaque 300  FLUOROSCOPY TIME:  2 min, 6 seconds.  COMPLICATIONS: None immediate  TECHNIQUE: The procedure, risks, benefits, and alternatives were explained to the patient's mother and informed written consent was obtained. A timeout was performed prior to the initiation of the procedure.   The right upper extremity was prepped with chlorhexidine in a sterile fashion, and a sterile drape was applied covering the operative field. Maximum barrier sterile technique with sterile gowns and gloves were used for the procedure. A timeout was performed prior to the initiation of the procedure. Local anesthesia was provided with 1% lidocaine.  Under direct ultrasound guidance, the right brachial vein was accessed with a micropuncture kit after the overlying soft tissues were anesthetized with 1% lidocaine. An ultrasound image was saved for documentation purposes. A guidewire was advanced to the level of the superior caval-atrial junction for measurement purposes and the PICC line was cut to length. A peel-away sheath was placed, however there is a difficulty advancing the PICC line centrally. As such, a limited venogram was performed in with the use of a Nitrex wire, a 26 cm, 5 [place], dual lumen was inserted to level of the superior caval-atrial junction. A post procedure spot fluoroscopic was obtained. The catheter easily aspirated and flushed and was sutured in place. A dressing was placed. The patient tolerated the procedure well without immediate post procedural complication.  FINDINGS: After catheter placement, the tip lies within the superior cavoatrial junction. The catheter aspirates and flushes normally and is ready for immediate use.  IMPRESSION: Successful ultrasound and fluoroscopic guided placement of a right brachial vein approach, 26 cm, 5 French, dual lumen PICC with tip at the superior caval-atrial junction. The PICC line is ready for immediate use.   Electronically Signed   By: Sandi Mariscal M.D.   On: 12/25/2013 15:55   Ir US Guide Vasc Access Right  12/25/2013   INDICATION: History of cerebral palsy, in need of intravenous access for medication administration  EXAM: ULTRASOUND AND FLUOROSCOPIC GUIDED PICC LINE INSERTION  MEDICATIONS: Fentanyl 25 mcg IV  CONTRAST:  5 cc Omnipaque 300   FLUOROSCOPY TIME:  2 min, 6 seconds.  COMPLICATIONS: None immediate  TECHNIQUE: The procedure, risks, benefits, and alternatives were explained to the patient's mother and informed written consent was obtained. A timeout was performed prior to the initiation of the procedure.  The right upper extremity was prepped with chlorhexidine in a sterile fashion, and a sterile drape was applied covering the operative field. Maximum barrier sterile technique with sterile gowns and gloves were used for the procedure. A timeout was performed prior to the initiation of the procedure. Local anesthesia was provided with 1% lidocaine.  Under direct ultrasound guidance, the right brachial vein was accessed with a micropuncture kit after the overlying soft tissues were anesthetized with 1% lidocaine. An ultrasound image was saved for  documentation purposes. A guidewire was advanced to the level of the superior caval-atrial junction for measurement purposes and the PICC line was cut to length. A peel-away sheath was placed, however there is a difficulty advancing the PICC line centrally. As such, a limited venogram was performed in with the use of a Nitrex wire, a 26 cm, 5 [place], dual lumen was inserted to level of the superior caval-atrial junction. A post procedure spot fluoroscopic was obtained. The catheter easily aspirated and flushed and was sutured in place. A dressing was placed. The patient tolerated the procedure well without immediate post procedural complication.  FINDINGS: After catheter placement, the tip lies within the superior cavoatrial junction. The catheter aspirates and flushes normally and is ready for immediate use.  IMPRESSION: Successful ultrasound and fluoroscopic guided placement of a right brachial vein approach, 26 cm, 5 French, dual lumen PICC with tip at the superior caval-atrial junction. The PICC line is ready for immediate use.   Electronically Signed   By: Sandi Mariscal M.D.   On: 12/25/2013 15:55    Dg Chest Port 1 View  12/24/2013   CLINICAL DATA:  Pneumonia.  EXAM: PORTABLE CHEST - 1 VIEW  COMPARISON:  September 24, 2013.  FINDINGS: Stable cardiomediastinal silhouette. Severe dextroscoliosis of thoracic spine is noted with Harrington rods in place. No pneumothorax or significant pleural effusion is noted. Right lung is clear. Left suprahilar interstitial density is noted which may represent early edema or possibly inflammation.  IMPRESSION: Mild left suprahilar interstitial opacity is noted concerning for early edema or possibly pneumonia. Followup radiographs are recommended.   Electronically Signed   By: Sabino Dick M.D.   On: 12/24/2013 14:23      ASSESSMENT / PLAN:  PULMONARY A: Acute on chronic Hypoxic Resp Failure (DNR/DNI )  Suspected PNA -recurrent in setting of CP and poor cough mechanics  Spoke with family extensively today, informed that we are unlikely to get her through this as long as patient is not tolerating suction.   P:   - Continue broad abx as below. - BD Nebs  - Follow CXR today and in AM. - O2 to keep sats >90%. - No plans intubation or ACLS. - After discussion, asked the family to assist [place] in managing the suction component as she seems to respond better to them and to alert [place] if patient is in distress so we can liberalize morphine use.  CARDIOVASCULAR A:  P:  - Tele monitoring   RENAL A:   P:   - Replace electrolytes as indicated  - Will give low dose diuretics for now, hopefully will assist with secretion.  GASTROINTESTINAL A:  Chronic Peg  Constipation  P:   - Cont TF -dietician consult 6/5 - Miralax    HEMATOLOGIC A:   P:  - Follow CBC  INFECTIOUS A:  Suspected LUL PNA (likely aspiration)  P:   - Imipenem + azithro for atypical coverage  ENDOCRINE A:  No active issues P:   - Monitor  NEUROLOGIC A:  CP, nonverbal  P:   - Supportive care.  TODAY'S SUMMARY: 28 yo CP pt (full care at home ) admitted from office for Pneumonia and  Hypoxia 6/4 .  PICC in place via IR.  Family is very distraught with suctioning however patient is accumulating more secretion and is not able to clear successfully.  Family and RN were made aware of conversation, if patient deteriorates then will liberalize morphine use and proceed with comfort.  CC  time 35 min.  Rush Farmer, M.D. [facility] Pulmonary/Critical Care Medicine. Pager: [phone]. After hours pager: [phone].

## 2013-12-27 ENCOUNTER — Inpatient Hospital Stay (HOSPITAL_COMMUNITY): Payer: 59

## 2013-12-27 DIAGNOSIS — Q043 Other reduction deformities of brain: Secondary | ICD-10-CM

## 2013-12-27 LAB — GLUCOSE, CAPILLARY
GLUCOSE-CAPILLARY: 133 mg/dL — AB (ref 70–99)
GLUCOSE-CAPILLARY: 134 mg/dL — AB (ref 70–99)
GLUCOSE-CAPILLARY: 84 mg/dL (ref 70–99)
GLUCOSE-CAPILLARY: 88 mg/dL (ref 70–99)
Glucose-Capillary: 90 mg/dL (ref 70–99)
Glucose-Capillary: 144 mg/dL — ABNORMAL HIGH (ref 70–99)

## 2013-12-27 LAB — BASIC METABOLIC PANEL
BUN: 5 mg/dL — AB (ref 6–23)
CALCIUM: 8.8 mg/dL (ref 8.4–10.5)
CHLORIDE: 100 meq/L (ref 96–112)
CO2: 35 meq/L — AB (ref 19–32)
GLUCOSE: 84 mg/dL (ref 70–99)
Potassium: 3.9 mEq/L (ref 3.7–5.3)
Sodium: 141 mEq/L (ref 137–147)

## 2013-12-27 LAB — CBC
HEMATOCRIT: 40.2 % (ref 36.0–46.0)
Hemoglobin: 13 g/dL (ref 12.0–15.0)
MCH: 34.4 pg — ABNORMAL HIGH (ref 26.0–34.0)
MCHC: 32.3 g/dL (ref 30.0–36.0)
MCV: 106.3 fL — AB (ref 78.0–100.0)
PLATELETS: 170 10*3/uL (ref 150–400)
RBC: 3.78 MIL/uL — AB (ref 3.87–5.11)
RDW: 13.2 % (ref 11.5–15.5)
WBC: 7.1 10*3/uL (ref 4.0–10.5)

## 2013-12-27 LAB — CULTURE, RESPIRATORY: GRAM STAIN: NONE SEEN

## 2013-12-27 LAB — PHOSPHORUS: Phosphorus: 2.4 mg/dL (ref 2.3–4.6)

## 2013-12-27 LAB — MAGNESIUM: Magnesium: 2.1 mg/dL (ref 1.5–2.5)

## 2013-12-27 LAB — CULTURE, RESPIRATORY W GRAM STAIN

## 2013-12-27 MED ORDER — FUROSEMIDE 10 MG/ML IJ SOLN
20.0000 mg | Freq: Four times a day (QID) | INTRAMUSCULAR | Status: AC
  Administered 2013-12-27 (×3): 20 mg via INTRAVENOUS
  Filled 2013-12-27 (×3): qty 2

## 2013-12-27 MED ORDER — JEVITY 1.5 CAL/FIBER PO LIQD
237.0000 mL | Freq: Four times a day (QID) | ORAL | Status: DC
  Administered 2013-12-27: 240 mL
  Administered 2013-12-27 – 2013-12-28 (×5): 237 mL
  Filled 2013-12-27 (×6): qty 1000

## 2013-12-27 MED ORDER — POTASSIUM CHLORIDE 20 MEQ/15ML (10%) PO LIQD
40.0000 meq | Freq: Once | ORAL | Status: AC
  Administered 2013-12-27: 40 meq
  Filled 2013-12-27: qty 30

## 2013-12-27 NOTE — Progress Notes (Signed)
Brief Nutrition Follow Up  Consult received from pharmacy regarding TF formulary.   Patient currently with order to receive 2 cans Jevity 1.5 and 2 cans Osmolite 1.5 daily to determine if lower fiber formula can help with patient's TF tolerance.   Per pharmacy, Jevity 1.5 only comes in 1000 ml, so a lot is wasted.   However, patient's mother reports that she has just been getting the Jevity 1.5 today and is tolerating well.   INTERVENTION:  - Will change TF order back to 237 ml Jevity 1.5, 4 times daily, which will provide 1420 kcal, 60 g protein, and 720 ml free water daily.  - RD to continue monitoring for TF tolerance.   Larey Seat, RD, LDN Pager #: [phone] After-Hours Pager #: [phone]

## 2013-12-27 NOTE — Progress Notes (Signed)
PULMONARY / CRITICAL CARE MEDICINE   Name: Marveline Profeta MRN: [id number] DOB: 02-Jan-1986    ADMISSION DATE:  12/24/2013  PRIMARY SERVICE: PCCM   CHIEF COMPLAINT:  Resp Distress, Hypoxia   BRIEF PATIENT DESCRIPTION: 28 yo CP pt of VS admitted from office with probable PNA and acute on chronic hypoxic Resp Failure. She is a DNR/DNI. She is cared for by family at home. Very supportive family.   SIGNIFICANT EVENTS / STUDIES:  6/4 Admit from office   LINES / TUBES: Chronic Peg   CULTURES: 6/4 bc>> 6/4 resp>>  ANTIBIOTICS: 6/4 primaxin>> 6/4 azithro>>  SUBJECTIVE:  Severe desaturation overnight if 100% is not on, once off 100% dropped down in the 60's.  VITAL SIGNS: Temp:  [96 F (35.6 C)-98.5 F (36.9 C)] 97.7 F (36.5 C) (06/07 0751) Pulse Rate:  [81-129] 81 (06/07 0800) Resp:  [13-21] 15 (06/07 0800) BP: (94-114)/(42-69) 114/69 mmHg (06/07 0800) SpO2:  [83 %-99 %] 93 % (06/07 0800) FiO2 (%):  [45 %-100 %] 100 % (06/07 0217) Weight:  [66 lb 2.2 oz (30 kg)] 66 lb 2.2 oz (30 kg) (06/07 0400) HEMODYNAMICS:   VENTILATOR SETTINGS: Vent Mode:  [-]  FiO2 (%):  [45 %-100 %] 100 % INTAKE / OUTPUT: Intake/Output     06/06 0701 - 06/07 0700 06/07 0701 - 06/08 0700   I.V. (mL/kg) 1800 (60) 75 (2.5)   Other 967 60   NG/GT 120 240   IV Piggyback 550    Total Intake(mL/kg) 3437 (114.6) 375 (12.5)   Net +3437 +375        Urine Occurrence 15 x    Stool Occurrence 1 x     PHYSICAL EXAMINATION: General - Thin, contracted, moves right in agitated fashion HEENT [place]/AT, PERRL, EOM-spontaneous, MMM with increased secretion. Cardiac - s1s2 regular, no murmur  Chest - Transmitted upper airway sounds in all lung fields. Abdomen - soft, PEG tube in place  Extremities - no edema, contracted  Neurologic - awake, non-verbal, agitated   LABS:  CBC  Recent Labs Lab 12/25/13 0334 12/26/13 0415 12/27/13 0555  WBC 9.7 7.0 7.1  HGB 14.8 13.9 13.0  HCT 45.4 43.9 40.2  PLT 177 177  170   Coag's No results found for this basename: APTT, INR,  in the last 168 hours BMET  Recent Labs Lab 12/25/13 0334 12/26/13 0415 12/27/13 0555  NA 142 141 141  K 4.0 3.8 3.9  CL 100 100 100  CO2 32 36* 35*  BUN 4* 4* 5*  CREATININE 0.23* <0.20* <0.20*  GLUCOSE 94 91 84   Electrolytes  Recent Labs Lab 12/24/13 1502 12/25/13 0334 12/26/13 0415 12/27/13 0555  CALCIUM 9.1 8.7 8.5 8.8  MG 2.1  --  2.0 2.1  PHOS 3.7  --  3.5 2.4   Sepsis Markers  Recent Labs Lab 12/24/13 1501  PROCALCITON <0.10   ABG No results found for this basename: PHART, PCO2ART, PO2ART,  in the last 168 hours Liver Enzymes  Recent Labs Lab 12/24/13 1502  AST 59*  ALT 58*  ALKPHOS 151*  BILITOT <0.2*  ALBUMIN 3.3*   Cardiac Enzymes No results found for this basename: TROPONINI, PROBNP,  in the last 168 hours Glucose  Recent Labs Lab 12/26/13 1636 12/26/13 2014 12/26/13 2118 12/27/13 0005 12/27/13 0435 12/27/13 0750  GLUCAP 156* 69* 113* 134* 88 84    Imaging Ir Fluoro Guide Cv Line Right  12/25/2013   INDICATION: History of cerebral palsy, in need of  intravenous access for medication administration  EXAM: ULTRASOUND AND FLUOROSCOPIC GUIDED PICC LINE INSERTION  MEDICATIONS: Fentanyl 25 mcg IV  CONTRAST:  5 cc Omnipaque 300  FLUOROSCOPY TIME:  2 min, 6 seconds.  COMPLICATIONS: None immediate  TECHNIQUE: The procedure, risks, benefits, and alternatives were explained to the patient's mother and informed written consent was obtained. A timeout was performed prior to the initiation of the procedure.  The right upper extremity was prepped with chlorhexidine in a sterile fashion, and a sterile drape was applied covering the operative field. Maximum barrier sterile technique with sterile gowns and gloves were used for the procedure. A timeout was performed prior to the initiation of the procedure. Local anesthesia was provided with 1% lidocaine.  Under direct ultrasound guidance, the  right brachial vein was accessed with a micropuncture kit after the overlying soft tissues were anesthetized with 1% lidocaine. An ultrasound image was saved for documentation purposes. A guidewire was advanced to the level of the superior caval-atrial junction for measurement purposes and the PICC line was cut to length. A peel-away sheath was placed, however there is a difficulty advancing the PICC line centrally. As such, a limited venogram was performed in with the use of a Nitrex wire, a 26 cm, 5 [place], dual lumen was inserted to level of the superior caval-atrial junction. A post procedure spot fluoroscopic was obtained. The catheter easily aspirated and flushed and was sutured in place. A dressing was placed. The patient tolerated the procedure well without immediate post procedural complication.  FINDINGS: After catheter placement, the tip lies within the superior cavoatrial junction. The catheter aspirates and flushes normally and is ready for immediate use.  IMPRESSION: Successful ultrasound and fluoroscopic guided placement of a right brachial vein approach, 26 cm, 5 French, dual lumen PICC with tip at the superior caval-atrial junction. The PICC line is ready for immediate use.   Electronically Signed   By: Sandi Mariscal M.D.   On: 12/25/2013 15:55   Ir US Guide Vasc Access Right  12/25/2013   INDICATION: History of cerebral palsy, in need of intravenous access for medication administration  EXAM: ULTRASOUND AND FLUOROSCOPIC GUIDED PICC LINE INSERTION  MEDICATIONS: Fentanyl 25 mcg IV  CONTRAST:  5 cc Omnipaque 300  FLUOROSCOPY TIME:  2 min, 6 seconds.  COMPLICATIONS: None immediate  TECHNIQUE: The procedure, risks, benefits, and alternatives were explained to the patient's mother and informed written consent was obtained. A timeout was performed prior to the initiation of the procedure.  The right upper extremity was prepped with chlorhexidine in a sterile fashion, and a sterile drape was applied  covering the operative field. Maximum barrier sterile technique with sterile gowns and gloves were used for the procedure. A timeout was performed prior to the initiation of the procedure. Local anesthesia was provided with 1% lidocaine.  Under direct ultrasound guidance, the right brachial vein was accessed with a micropuncture kit after the overlying soft tissues were anesthetized with 1% lidocaine. An ultrasound image was saved for documentation purposes. A guidewire was advanced to the level of the superior caval-atrial junction for measurement purposes and the PICC line was cut to length. A peel-away sheath was placed, however there is a difficulty advancing the PICC line centrally. As such, a limited venogram was performed in with the use of a Nitrex wire, a 26 cm, 5 [place], dual lumen was inserted to level of the superior caval-atrial junction. A post procedure spot fluoroscopic was obtained. The catheter easily aspirated and flushed and  was sutured in place. A dressing was placed. The patient tolerated the procedure well without immediate post procedural complication.  FINDINGS: After catheter placement, the tip lies within the superior cavoatrial junction. The catheter aspirates and flushes normally and is ready for immediate use.  IMPRESSION: Successful ultrasound and fluoroscopic guided placement of a right brachial vein approach, 26 cm, 5 French, dual lumen PICC with tip at the superior caval-atrial junction. The PICC line is ready for immediate use.   Electronically Signed   By: Sandi Mariscal M.D.   On: 12/25/2013 15:55   Dg Chest Port 1 View  12/27/2013   CLINICAL DATA:  Respiratory failure.  EXAM: PORTABLE CHEST - 1 VIEW  COMPARISON:  12/26/2013  FINDINGS: The heart is enlarged. The PICC line is stable. There are bilateral lower lobe infiltrates. No effusions.  IMPRESSION: Bibasilar infiltrates.   Electronically Signed   By: Kalman Jewels M.D.   On: 12/27/2013 05:33   Dg Chest Port 1  View  12/26/2013   CLINICAL DATA:  Respiratory failure.  EXAM: PORTABLE CHEST - 1 VIEW  COMPARISON:  Single view of the chest 12/24/2013 and 09/24/2013.  FINDINGS: Perihilar opacities are identified. Heart size is upper normal. Left basilar atelectasis is identified. No pneumothorax is seen. Severe scoliosis with spinal stabilization hardware in place again identified. Ventriculostomy shunt catheter is also again noted.  IMPRESSION: Perihilar airspace disease could be due to edema or pneumonia, including viral pneumonia.  Left basilar atelectasis.   Electronically Signed   By: Inge Rise M.D.   On: 12/26/2013 10:54   ASSESSMENT / PLAN:  PULMONARY A: Acute on chronic Hypoxic Resp Failure (DNR/DNI )  Suspected PNA -recurrent in setting of CP and poor cough mechanics  Spoke with family extensively today, informed that we are unlikely to get her through this as long as patient is not tolerating suction.   P:   - Continue broad abx as below. - BD Nebs  - Follow CXR today and in AM. - O2 to keep sats >90%. - No plans intubation or ACLS. - After discussion, asked the family to assist [place] in managing the suction component as she seems to respond better to them and to alert [place] if patient is in distress so we can liberalize morphine use. - Gentle diureses today. - KVO IVF.  CARDIOVASCULAR A:  P:  - Tele monitoring   RENAL A:  Fluid overloaded. P:   - Replace electrolytes as indicated  - KVO IVF. - Lasix 20 mg IV q6 hours x3 doses. - Single dose of K.  GASTROINTESTINAL A:  Chronic Peg  Constipation  P:   - Cont TF -dietician consult 6/5 - Miralax    HEMATOLOGIC A:   P:  - Follow CBC  INFECTIOUS A:  Suspected LUL PNA (likely aspiration)  P:   - Imipenem + azithro for atypical coverage  ENDOCRINE A:  No active issues P:   - Monitor  NEUROLOGIC A:  CP, nonverbal  P:   - Supportive care.  TODAY'S SUMMARY: 28 yo CP pt (full care at home ) admitted from office for  Pneumonia and Hypoxia 6/4 .  PICC in place via IR.  Family was very distraught with suctioning however patient is accumulating more secretion and is not able to clear successfully but less distraught today after our conversation on 6/6.  Family and RN were made aware of conversation, if patient deteriorates then will liberalize morphine use and proceed with comfort.  CC time 35  min.  Rush Farmer, M.D. [facility] Pulmonary/Critical Care Medicine. Pager: [phone]. After hours pager: [phone].

## 2013-12-27 NOTE — Progress Notes (Signed)
Patient had a seizure lasting several minutes and patient was crying out during seizure.  Mother at bedside.  HR up to 150/min during seizure.  Continue to monitor patient closely.  Christionna Poland Roselie Awkward RN

## 2013-12-28 ENCOUNTER — Other Ambulatory Visit: Payer: Self-pay | Admitting: Pulmonary Disease

## 2013-12-28 LAB — BASIC METABOLIC PANEL
BUN: 9 mg/dL (ref 6–23)
CALCIUM: 9.7 mg/dL (ref 8.4–10.5)
CO2: 36 meq/L — AB (ref 19–32)
Chloride: 95 mEq/L — ABNORMAL LOW (ref 96–112)
Creatinine, Ser: 0.2 mg/dL — ABNORMAL LOW (ref 0.50–1.10)
GFR calc Af Amer: >90 mL/min (ref >90)
GFR calc non Af Amer: >90 mL/min (ref >90)
GLUCOSE: 84 mg/dL (ref 70–99)
Potassium: 4.3 mEq/L (ref 3.7–5.3)
SODIUM: 141 meq/L (ref 137–147)

## 2013-12-28 LAB — MAGNESIUM: Magnesium: 2.2 mg/dL (ref 1.5–2.5)

## 2013-12-28 LAB — CBC
HEMATOCRIT: 46.8 % — AB (ref 36.0–46.0)
HEMOGLOBIN: 15.4 g/dL — AB (ref 12.0–15.0)
MCH: 34.6 pg — AB (ref 26.0–34.0)
MCHC: 32.9 g/dL (ref 30.0–36.0)
MCV: 105.2 fL — ABNORMAL HIGH (ref 78.0–100.0)
Platelets: 220 10*3/uL (ref 150–400)
RBC: 4.45 MIL/uL (ref 3.87–5.11)
RDW: 13.3 % (ref 11.5–15.5)
WBC: 9.2 10*3/uL (ref 4.0–10.5)

## 2013-12-28 LAB — PHOSPHORUS: PHOSPHORUS: 4.2 mg/dL (ref 2.3–4.6)

## 2013-12-28 LAB — GLUCOSE, CAPILLARY
Glucose-Capillary: 94 mg/dL (ref 70–99)
Glucose-Capillary: 80 mg/dL (ref 70–99)
Glucose-Capillary: 82 mg/dL (ref 70–99)

## 2013-12-28 MED ORDER — PANTOPRAZOLE SODIUM 40 MG PO PACK
40.0000 mg | PACK | Freq: Every day | ORAL | Status: DC
  Administered 2013-12-28: 40 mg
  Filled 2013-12-28: qty 20

## 2013-12-28 MED ORDER — IMIPENEM-CILASTATIN 250 MG IV SOLR: 250.0000 mg | Freq: Three times a day (TID) | INTRAVENOUS | Status: DC

## 2013-12-28 MED ORDER — SODIUM CHLORIDE 0.9 % IV SOLN: 250.0000 mg | Freq: Three times a day (TID) | INTRAVENOUS | Status: DC

## 2013-12-28 NOTE — Progress Notes (Signed)
Pt d/c home. PICC disconnected and flushed both ports with 10cc NS. Per [place], CM and [place], [facility] a [facility] will arrive at ~ 8pm to administer IV  medications. D/c instructions given, medication schedule as per home per MD orders. Pt's Mom, Stanton Kidney verbalized understanding and teach back regarding routine care and contacting [facility].

## 2013-12-28 NOTE — Progress Notes (Addendum)
Mouth care done. Oral sxn done.

## 2013-12-28 NOTE — Progress Notes (Signed)
PULMONARY / CRITICAL CARE MEDICINE   Name: Renee [name] MRN: [id number] DOB: 1986/01/24    ADMISSION DATE:  12/24/2013  PRIMARY SERVICE: PCCM   CHIEF COMPLAINT:  Resp Distress, Hypoxia   BRIEF PATIENT DESCRIPTION: 28 yo CP pt of VS admitted from office with probable PNA and acute on chronic hypoxic Resp Failure. She is a DNR/DNI. She is cared for by family at home. Very supportive family.   SIGNIFICANT EVENTS / STUDIES:  6/4 Admit from office  2023/01/04 looks much better  LINES / TUBES: Chronic Peg  Rt PICC 6/05 >>  CULTURES: 6/4 bc>> 6/4 resp>>  ANTIBIOTICS: 6/4 primaxin>> 6/4 azithro>> 01-04-23  SUBJECTIVE:  Looks much better  VITAL SIGNS: Temp:  [97.6 F (36.4 C)-100.1 F (37.8 C)] 98.3 F (36.8 C) 2023/01/04 0800) Pulse Rate:  [96-112] 112 (06/07 2200) Resp:  [16-22] 16 2023/01/04 0600) BP: (91-149)/(45-88) 104/54 mmHg January 04, 2023 0600) SpO2:  [83 %-100 %] 95 % 2023-01-04 0825) Weight:  [28.1 kg (61 lb 15.2 oz)] 28.1 kg (61 lb 15.2 oz) 04-Jan-2023 0400) INTAKE / OUTPUT: Intake/Output     06/07 0701 - 01/04/23 0700 2023/01/04 0701 - 06/09 0700   I.V. (mL/kg) 305 (10.9)    Other 200    NG/GT 1137    IV Piggyback 550    Total Intake(mL/kg) 2192 (78)    Net +2192          Urine Occurrence 16 x    Stool Occurrence 3 x     PHYSICAL EXAMINATION: General - Thin, contracted, moves right in agitated fashion HEENT [place]/AT, PERRL, EOM-spontaneous, MMM with increased secretion. Cardiac - s1s2 regular, no murmur  Chest - Transmitted upper airway sounds in all lung fields. Abdomen - soft, PEG tube in place  Extremities - no edema, contracted  Neurologic - awake, non-verbal, less agitated  LABS:  CBC  Recent Labs Lab 12/26/13 0415 12/27/13 0555 03-Jan-2014 0455  WBC 7.0 7.1 9.2  HGB 13.9 13.0 15.4*  HCT 43.9 40.2 46.8*  PLT 177 170 220   BMET  Recent Labs Lab 12/26/13 0415 12/27/13 0555 2014-01-03 0455  NA 141 141 141  K 3.8 3.9 4.3  CL 100 100 95*  CO2 36* 35* 36*  BUN 4* 5* 9   CREATININE <0.20* <0.20* 0.20*  GLUCOSE 91 84 84   Electrolytes  Recent Labs Lab 12/26/13 0415 12/27/13 0555 01/03/14 0455  CALCIUM 8.5 8.8 9.7  MG 2.0 2.1 2.2  PHOS 3.5 2.4 4.2   Sepsis Markers  Recent Labs Lab 12/24/13 1501  PROCALCITON <0.10   Liver Enzymes  Recent Labs Lab 12/24/13 1502  AST 59*  ALT 58*  ALKPHOS 151*  BILITOT <0.2*  ALBUMIN 3.3*   Glucose  Recent Labs Lab 12/27/13 1112 12/27/13 1501 12/27/13 2036 January 03, 2014 0050 2014-01-03 0455 2014/01/03 0756  GLUCAP 90 144* 133* 94 80 82    Imaging Dg Chest Port 1 View  12/27/2013   CLINICAL DATA:  Respiratory failure.  EXAM: PORTABLE CHEST - 1 VIEW  COMPARISON:  12/26/2013  FINDINGS: The heart is enlarged. The PICC line is stable. There are bilateral lower lobe infiltrates. No effusions.  IMPRESSION: Bibasilar infiltrates.   Electronically Signed   By: Kalman Jewels M.D.   On: 12/27/2013 05:33   Dg Chest Port 1 View  12/26/2013   CLINICAL DATA:  Respiratory failure.  EXAM: PORTABLE CHEST - 1 VIEW  COMPARISON:  Single view of the chest 12/24/2013 and 09/24/2013.  FINDINGS: Perihilar opacities are identified. Heart size is  upper normal. Left basilar atelectasis is identified. No pneumothorax is seen. Severe scoliosis with spinal stabilization hardware in place again identified. Ventriculostomy shunt catheter is also again noted.  IMPRESSION: Perihilar airspace disease could be due to edema or pneumonia, including viral pneumonia.  Left basilar atelectasis.   Electronically Signed   By: Inge Rise M.D.   On: 12/26/2013 10:54   ASSESSMENT / PLAN:  A:  Acute on chronic respiratory failure 2nd to recurrent PNA from aspiration. DNR/DNI. P:   Continue pulmicort, albuterol, mucinex Bronchial hygiene as tolerated Oxygen as need to keep SpO2 > 90% Day 5 of abx >> continue primaxin for total of 10 days, and d/c zithromax 6/08  A:   Chronic PEG. Constipation. P:   Continue tube feeds  A:   Hx of CP,  nonverbal  P:   Continue baclofen, primidone, risperdal, zanaflex   Richardson Landry Minor ACNP Maryanna Shape PCCM Pager [phone] till 3 pm If no answer page [phone] 12/28/2013, 9:58 AM  Reviewed above, examined.  Clinically improved.  Will try to arrange for home Abx set up.  Family has other equipment needed to take care of her at home.  Possible d/c in PM of 6/08 or AM of 6/09.  [name], MD [facility] Pulmonary/Critical Care 12/28/2013, 11:07 AM Pager:  [phone] After 3pm call: [phone]

## 2013-12-28 NOTE — Progress Notes (Signed)
[id number]-[facility] for home care and iv abx, adie through [facility] is with the patient,adgvnced hhc notified of need for the medications/family is agreeable with this/Rayane Gallardo,RN,BSN,CCM.

## 2013-12-28 NOTE — Progress Notes (Signed)
[id number]/JOACZ several attempts contact Ruffin Pyo with [facility] and three faxed packets of information she returnede my [phone to call.  Unable to do the RN teaching for the home iv abx due to a staffing issue.  TCT to [name] with [facility] to see if they can send an RN out to the home this evening for iv abx administration and family teaching.

## 2013-12-28 NOTE — Discharge Summary (Signed)
Physician Discharge Summary  Patient ID: Renee [name] MRN: [id number] DOB/AGE: November 05, 1985 28 y.o.  Admit date: 12/24/2013 Discharge date: 01-18-2014  Problem List Principal Problem:   Aspiration pneumonia Active Problems:   CEREBRAL PALSY   Mild persistent asthma   Generalized nonconvulsive epilepsy with intractable epilepsy   Unspecified constipation   Acute and chronic respiratory failure   Protein-calorie malnutrition, severe   Malnourished  HPI: Presented to pulmonary clinic on 12/24/2013 for an acute visit.  Pt is accompanied by grandma and aunt.  Reports 2 days of increased congestion, rattling in chest , low grade fever, dyspnea and very lethargic.  O2 sat is 83% upon arrival on room.  Gets bolus feeds in peg, no residuals.  Mild constipation , takes miralax  Renee [name], Renee [name], is on way home from [place]. Spoke with her on phone , aware of need for hospital admit . Pt is DNR/DNI .   Hospital Course:  SIGNIFICANT EVENTS / STUDIES:  6/4 Admit from office  2023/01/19 looks much better  LINES / TUBES:  Chronic Peg  Rt PICC 6/05 >>  CULTURES:  6/4 bc>>  6/4 resp>> nl flora ANTIBIOTICS:  6/4 primaxin>> planned stop date 6/14 6/4 azithro>> 01/19/2023  ASSESSMENT / PLAN:  A:  Acute on chronic respiratory failure 2nd to recurrent PNA from aspiration.  DNR/DNI.  P:  Continue pulmicort, albuterol, mucinex  Bronchial hygiene as tolerated  Oxygen as need to keep SpO2 > 90%  Day 5 of abx >> continue primaxin for total of 10 days, and d/c zithromax 2023-01-19  A:  Chronic PEG.  Constipation.  P:  Continue tube feeds  A:  Hx of CP, nonverbal  P:  Continue baclofen, primidone, risperdal, zanaflex   Clinically improved. Have arrange for home Abx set up. Family has other equipment needed to take care of her at home. Will d/c in PM of January 19, 2023 with follow up in one week with Renee [name] ANP.    Labs at discharge Lab Results  Component Value Date   CREATININE 0.20* Jan 18, 2014   BUN 9 01/18/2014   NA 141  January 18, 2014   K 4.3 2014-01-18   CL 95* Jan 18, 2014   CO2 36* 01/18/14   Lab Results  Component Value Date   WBC 9.2 01/18/2014   HGB 15.4* 01/18/14   HCT 46.8* 01-18-14   MCV 105.2* 2014-01-18   PLT 220 2014-01-18   Lab Results  Component Value Date   ALT 58* 12/24/2013   AST 59* 12/24/2013   ALKPHOS 151* 12/24/2013   BILITOT <0.2* 12/24/2013   Lab Results  Component Value Date   INR 1.3 12/13/2008   INR 1.7* 05/06/2008    Current radiology studies Dg Chest Port 1 View  12/27/2013   CLINICAL DATA:  Respiratory failure.  EXAM: PORTABLE CHEST - 1 VIEW  COMPARISON:  12/26/2013  FINDINGS: The heart is enlarged. The PICC line is stable. There are bilateral lower lobe infiltrates. No effusions.  IMPRESSION: Bibasilar infiltrates.   Electronically Signed   By: Renee [name] M.D.   On: 12/27/2013 05:33    Disposition:  home     Medication List         acetylcysteine 20 % nebulizer solution  Commonly known as:  MUCOMYST  Take 4 mLs by nebulization every 4 (four) hours as needed (shortness of breath, wheezing).     acetylcysteine 20 % nebulizer solution  Commonly known as:  MUCOMYST  Take 2 mLs by nebulization every 4 (four) hours as needed.  albuterol (2.5 MG/3ML) 0.083% nebulizer solution  Commonly known as:  PROVENTIL  Take 2.5 mg by nebulization every 6 (six) hours as needed for wheezing or shortness of breath.     baclofen 10 MG tablet  Commonly known as:  LIORESAL  Take 10-20 mg by mouth 4 (four) times daily. Take 2 tablets (20mg ) at 0700. Take 1 tablet (10mg ) at 1100. Take 2 tablets (20mg ) at 1500. Take 2 tablets (20mg ) at 2000.     budesonide 0.25 MG/2ML nebulizer solution  Commonly known as:  PULMICORT  Take 0.25 mg by nebulization 2 (two) times daily.     feeding supplement (JEVITY 1.5 CAL/FIBER) Liqd  Place 237 mLs into feeding tube 4 (four) times daily. 1 can at 0700, 1100, 1500, and 2000.     GAS-X INFANT DROPS 40 MG/0.6ML Liqd  Generic drug:  Simethicone  Give 40 mg  by tube 3 (three) times daily.     imipenem-cilastatin 250 mg in sodium chloride 0.9 % 100 mL  Inject 250 mg into the vein every 8 (eight) hours.     medroxyPROGESTERone 150 MG/ML injection  Commonly known as:  DEPO-PROVERA  Inject 1 mL (150 mg total) into the muscle every 3 (three) months.     MIRALAX powder  Generic drug:  polyethylene glycol powder  Take 17 g by mouth every evening.     MUCINEX COLD/KIDS 2.5-100 MG/5ML Liqd  Generic drug:  Phenylephrine-Guaifenesin  Give 20 mLs by tube 2 (two) times daily as needed (congestion).     primidone 50 MG tablet  Commonly known as:  MYSOLINE  Take 200 mg by mouth 3 (three) times daily. Take 4 tablets at 0700, 1500, and 2000.     [name] 0.5 MG tablet  Commonly known as:  RISPERDAL  Take 0.5-1 mg by mouth 2 (two) times daily. Take 1 tablet (0.5mg ) at 1100. Take 2 tablets (1mg ) at 1900.     tiZANidine 2 MG tablet  Commonly known as:  ZANAFLEX  Take 2 mg by mouth every morning. Take 1 tablet at 0700.           Follow-up Information   Follow up with [name],TAMMY, NP On 01/04/2014. (Tammy 11:45 am)    Specialty:  Nurse Practitioner   Contact information:   [address] [address] [phone]        Discharged Condition: poor  Time spent on discharge greater than 40 minutes.  Vital signs at Discharge. Temp:  [97.6 F (36.4 C)-100.1 F (37.8 C)] 98.3 F (36.8 C) (06/08 0800) Pulse Rate:  [101-112] 112 (06/07 2200) Resp:  [16-22] 17 (06/08 1000) BP: (84-149)/(45-88) 102/51 mmHg (06/08 1000) SpO2:  [83 %-100 %] 91 % (06/08 1000) Weight:  [28.1 kg (61 lb 15.2 oz)] 28.1 kg (61 lb 15.2 oz) (06/08 0400) Office follow up Special Information or instructions. Signed: Richardson Landry [name] ACNP Renee [name] Renee [name] Pager [phone] till 3 pm If no answer page [phone] 12/28/2013, 11:45 AM     Renee Mires, MD Renee [name] 12/28/2013, 1:03 PM Pager:  [phone] After 3pm call: [phone]

## 2013-12-30 ENCOUNTER — Telehealth: Payer: Self-pay | Admitting: Pulmonary Disease

## 2013-12-30 DIAGNOSIS — J690 Pneumonitis due to inhalation of food and vomit: Secondary | ICD-10-CM

## 2013-12-30 DIAGNOSIS — G809 Cerebral palsy, unspecified: Secondary | ICD-10-CM

## 2013-12-30 DIAGNOSIS — G808 Other cerebral palsy: Secondary | ICD-10-CM

## 2013-12-30 LAB — CULTURE, BLOOD (ROUTINE X 2)
Culture: NO GROWTH
Culture: NO GROWTH

## 2013-12-30 NOTE — Telephone Encounter (Signed)
Spoke with [name]-patients mother-she is aware that I am sending message to VS to approve giving order for hospital bed.   Stanton Kidney gave fax # to fax order to . [phone] Mickeal Needy.

## 2013-12-30 NOTE — Telephone Encounter (Signed)
Okay to send order. 

## 2013-12-30 NOTE — Telephone Encounter (Signed)
Order has been placed and faxed to [facility] supply attn Santiago Glad.  Joellen Jersey called the pts mother and lmom to make her aware that this has been taken care of.

## 2013-12-31 ENCOUNTER — Telehealth: Payer: Self-pay | Admitting: *Deleted

## 2013-12-31 NOTE — Telephone Encounter (Signed)
Morene Antu of [employer] [facility] [employer] called to get a verbal order to continue in home CNA care for the patient. She can be reached at [phone].

## 2013-12-31 NOTE — Telephone Encounter (Signed)
I called approval to Morene Antu at [facility]. TG

## 2014-01-04 ENCOUNTER — Other Ambulatory Visit: Payer: Self-pay | Admitting: Pulmonary Disease

## 2014-01-04 ENCOUNTER — Ambulatory Visit (INDEPENDENT_AMBULATORY_CARE_PROVIDER_SITE_OTHER): Payer: 59 | Admitting: Adult Health

## 2014-01-04 ENCOUNTER — Ambulatory Visit: Payer: 59

## 2014-01-04 ENCOUNTER — Encounter: Payer: Self-pay | Admitting: Adult Health

## 2014-01-04 VITALS — BP 116/80 | HR 111

## 2014-01-04 DIAGNOSIS — J690 Pneumonitis due to inhalation of food and vomit: Secondary | ICD-10-CM

## 2014-01-04 NOTE — Patient Instructions (Signed)
Chest xray next week as planned at [facility].  After xray will decide on PICC removal  Continue on Pulmicort Neb Twice daily   Continue on Albuterol Neb every 4hr as needed .  Follow up Dr. Halford Chessman  3 months .

## 2014-01-04 NOTE — Progress Notes (Signed)
   Subjective:    Patient ID: Renee [name], female    DOB: 1986/06/09, 28 y.o.   MRN: [id number]  HPI  28 yo with known  hypoxemia, and reactive airways disease in setting of cerebral palsy.  She is DNR/DNI.  01/04/2014 [facility] follow up  Patient returns accompanied by her mom and on for post hospital followup. She was admitted June 4 through June 8 for aspiration pneumonia She was treated with aggressive IV antibiotics, and continued on Primaxin. At discharge for a total of 10 days of therapy. She was continued on her pulmonary hygiene regimen with Pulmicort. Albuterol and Mucomyst as needed. For copious secretions Mom says that she is doing very well. That her breathing has improved, and she is back to her baseline. Says her oxygenation is at home have been doing better with O2 sats around 92% on room air. She finished her IV antibiotics, yesterday. Home health is helping with her PICC line care. Denies known fever, hemoptysis, diarrhea, or bloody stools  Review of Systems  Constitutional:   No  weight loss, night sweats,   Fevers, chills, fatigue, or  lassitude.  HEENT:   No headaches,  Difficulty swallowing,  Tooth/dental problems, or  Sore throat,                No sneezing, itching, ear ache, nasal congestion, post nasal drip,   CV:  No chest pain,  Orthopnea, PND, swelling in lower extremities, anasarca, dizziness, palpitations, syncope.   GI  No heartburn, indigestion, abdominal pain, nausea, vomiting, diarrhea, change in bowel habits, loss of appetite, bloody stools.   Resp:    No chest wall deformity  Skin: no rash or lesions.  GU: no dysuria, change in color of urine, no urgency or frequency.  No flank pain, no hematuria   MS:  No joint pain or swelling.  No decreased range of motion.  No back pain.  Psych:  No change in mood or affect. No depression or anxiety.  No memory loss.          Objective:   Physical Exam  General - Thin contracted female in  power chair  HEENT -no oral exudate Cardiac - s1s2 regular, no murmur PICC in right upper arm, dsg in place w/ no redness /drainage Chest - Coarse Rhonchi bilaterally  Abdomen - soft, PEG tube in place Extremities - no edema, contracted Neurologic - awake, non-verbal, agitated         Assessment & Plan:

## 2014-01-04 NOTE — Assessment & Plan Note (Addendum)
Aspiration PNA clinically iimproving  Hold on additional abx at this time  Check cxr -pt has appointment next week at [facility] for [place] , will have cxr done this day as well.   Plan  Chest xray next week as planned at [facility].  After xray will decide on PICC removal  Continue on Pulmicort Neb Twice daily   Continue on Albuterol Neb every 4hr as needed .  Follow up Dr. Halford Chessman  3 months .

## 2014-01-05 ENCOUNTER — Ambulatory Visit (INDEPENDENT_AMBULATORY_CARE_PROVIDER_SITE_OTHER): Payer: 59 | Admitting: Adult Health

## 2014-01-05 ENCOUNTER — Other Ambulatory Visit: Payer: Self-pay | Admitting: Adult Health

## 2014-01-05 ENCOUNTER — Ambulatory Visit (HOSPITAL_COMMUNITY)
Admission: RE | Admit: 2014-01-05 | Discharge: 2014-01-05 | Disposition: A | Discharge status: Home / Self Care | Payer: 59 | Source: Ambulatory Visit | Attending: Adult Health | Admitting: Adult Health

## 2014-01-05 DIAGNOSIS — Z982 Presence of cerebrospinal fluid drainage device: Secondary | ICD-10-CM | POA: Insufficient documentation

## 2014-01-05 DIAGNOSIS — J690 Pneumonitis due to inhalation of food and vomit: Secondary | ICD-10-CM

## 2014-01-05 DIAGNOSIS — K560 Paralytic ileus: Secondary | ICD-10-CM | POA: Insufficient documentation

## 2014-01-05 DIAGNOSIS — M4120 Other idiopathic scoliosis, site unspecified: Secondary | ICD-10-CM | POA: Insufficient documentation

## 2014-01-05 DIAGNOSIS — Z7689 Persons encountering health services in other specified circumstances: Secondary | ICD-10-CM

## 2014-01-05 MED ORDER — MEDROXYPROGESTERONE ACETATE 150 MG/ML IM SUSP
150.0000 mg | Freq: Once | INTRAMUSCULAR | Status: AC
  Administered 2014-01-05: 150 mg via INTRAMUSCULAR

## 2014-01-05 NOTE — Progress Notes (Signed)
Result Notes    Notes Recorded by Rinaldo Ratel, CMA on 01/05/2014 at 4:11 PM Called spoke with patient's mother Stanton Kidney and advised of cxr results/recs as stated by TP.  TP discussed with VS - okay to d/c PICC line. Orders only encounter created for d/c order to [facility]. ------  Notes Recorded by Melvenia Needles, NP on 01/05/2014 at 2:30 PM CXr is improved  Cont w/ ov recs  Will let you know about PICC line

## 2014-01-05 NOTE — Progress Notes (Signed)
Quick Note:  Called spoke with patient's mother Stanton Kidney and advised of cxr results/recs as stated by TP.  TP discussed with VS - okay to d/c PICC line. Orders only encounter created for d/c order to [facility]. ______

## 2014-01-05 NOTE — Progress Notes (Signed)
Reviewed and agree with assessment/plan. 

## 2014-01-05 NOTE — Progress Notes (Signed)
Patient ID: Renee [name], female   DOB: Oct 01, 1985, 28 y.o.   MRN: [id number] UPT not done, Pt has CP and is wheelchair bound and not sexually active.

## 2014-01-12 ENCOUNTER — Ambulatory Visit (HOSPITAL_COMMUNITY)
Admission: RE | Admit: 2014-01-12 | Discharge: 2014-01-12 | Disposition: A | Discharge status: Home / Self Care | Payer: 59 | Source: Ambulatory Visit | Attending: Urology | Admitting: Urology

## 2014-01-12 ENCOUNTER — Ambulatory Visit (INDEPENDENT_AMBULATORY_CARE_PROVIDER_SITE_OTHER): Payer: 59 | Admitting: Urology

## 2014-01-12 DIAGNOSIS — R338 Other retention of urine: Secondary | ICD-10-CM

## 2014-01-12 DIAGNOSIS — R339 Retention of urine, unspecified: Secondary | ICD-10-CM

## 2014-01-12 DIAGNOSIS — N19 Unspecified kidney failure: Secondary | ICD-10-CM | POA: Insufficient documentation

## 2014-01-25 ENCOUNTER — Telehealth: Payer: Self-pay | Admitting: Pulmonary Disease

## 2014-01-25 ENCOUNTER — Ambulatory Visit: Payer: 59 | Admitting: Internal Medicine

## 2014-01-25 NOTE — Telephone Encounter (Signed)
Requesting appt-- recent PNA x 1 mo ago C/o incr chest congestion, cough Using nebs as directed  Scheduled appt with Dr Melvyn Novas 01/26/14 at 9:15 Nothing further needed.

## 2014-01-25 NOTE — Telephone Encounter (Signed)
LMOM TCB x1 for pt's mother

## 2014-01-25 NOTE — Telephone Encounter (Signed)
Pt's mother returned triage's call.  Satira Anis

## 2014-01-26 ENCOUNTER — Ambulatory Visit (INDEPENDENT_AMBULATORY_CARE_PROVIDER_SITE_OTHER): Payer: 59 | Admitting: Internal Medicine

## 2014-01-26 ENCOUNTER — Encounter: Payer: Self-pay | Admitting: Internal Medicine

## 2014-01-26 VITALS — BP 102/60 | HR 116 | Temp 97.7°F

## 2014-01-26 DIAGNOSIS — J9620 Acute and chronic respiratory failure, unspecified whether with hypoxia or hypercapnia: Secondary | ICD-10-CM

## 2014-01-26 DIAGNOSIS — J690 Pneumonitis due to inhalation of food and vomit: Secondary | ICD-10-CM

## 2014-01-26 MED ORDER — LEVOFLOXACIN 500 MG PO TABS: 500.0000 mg | ORAL_TABLET | Freq: Every day | ORAL | Status: DC

## 2014-01-26 NOTE — Patient Instructions (Signed)
Levaquin 500 mg per tube x 10 days  Ok to use mucomyst with each albuterol up to 72 hours   Please remember to go to the   x-ray department at [facility] - we will call you with the results when they are available.  Ok to adjust 02 up to keep sats > 90%  If condition worsens go to ER

## 2014-01-26 NOTE — Progress Notes (Signed)
   Subjective:    Patient ID: Renee [name], female    DOB: 1985-12-23, 28 y.o.   MRN: [id number]  HPI  16 yowf with known  hypoxemia, and reactive airways disease in setting of cerebral palsy.  She is DNR/DNI.  01/04/2014 [facility] follow up  Patient returns accompanied by her mom and on for post hospital followup. She was admitted June 4 through June 8 for aspiration pneumonia She was treated with aggressive IV antibiotics, and continued on Primaxin. At discharge for a total of 10 days of therapy. She was continued on her pulmonary hygiene regimen with Pulmicort. Albuterol and Mucomyst as needed. For copious secretions Mom says that she is doing very well. That her breathing has improved, and she is back to her baseline. Says her oxygenation is at home have been doing better with O2 sats around 92% on room air. She finished her IV antibiotics, yesterday. Home health is helping with her PICC line care. Denies known fever, hemoptysis, diarrhea, or bloody stools rec Chest xray next week as planned at [facility].  After xray will decide on PICC removal  Continue on Pulmicort Neb Twice daily   Continue on Albuterol Neb every 4hr as needed   01/26/2014 f/u ov/Kazuki Ingle re:  Chief Complaint  Patient presents with  . Acute Visit    Pt c/o increased chest congestion for the past 3 days. Sputum this am was yellow. She has also had a low grade temp.  sats are hard to keep up because she keeps pulling 02 off but with 02 on they are upper 90's per mom. Not able to tol vest, mom wants to avoid admit, already doing CPPD   No obvious day to day or daytime variabilty or apparent cp or chest tightness      Current Medications, Allergies, Complete Past Medical History, Past Surgical History, Family History, and Social History were reviewed in [employer] record.  ROS  Not applicable          Objective:   Physical Exam  General - Thin contracted female in power chair   HEENT -no oral exudate Cardiac - s1s2 regular, no murmur PICC in right upper arm, dsg in place w/ no redness /drainage Chest - Coarse Rhonchi bilaterally  Abdomen - soft, PEG tube in place Extremities - no edema, contracted Neurologic - awake, non-verbal, agitated    cxr  01/05/14 Improved aeration in the lung bases with decrease in bibasilar  atelectasis/ pneumonia.  01/26/14 cxr ordered       Assessment & Plan:

## 2014-01-27 NOTE — Assessment & Plan Note (Signed)
prob recurrent and technically probably  HCAP - no recent pos sputum cultures so rec levaquin 500 x 10 days > admit only choice if worsens

## 2014-01-27 NOTE — Assessment & Plan Note (Signed)
sats ok on 02, no change rx needed

## 2014-02-02 LAB — PRIMIDONE AND METABOLITE LEVEL
PHENOBARBITAL: 35.2 mg/L (ref 15.0–40.0)
PRIMIDONE, SERUM: 7 ug/mL — AB (ref 8–12)
Phenobarbital: 36.3 mg/L (ref 15.0–40.0)
Primidone, Serum: 8 ug/mL (ref 8–12)

## 2014-02-04 ENCOUNTER — Telehealth: Payer: Self-pay | Admitting: Internal Medicine

## 2014-02-04 ENCOUNTER — Ambulatory Visit (HOSPITAL_COMMUNITY)
Admission: RE | Admit: 2014-02-04 | Discharge: 2014-02-04 | Disposition: A | Discharge status: Home / Self Care | Payer: 59 | Source: Ambulatory Visit | Attending: Internal Medicine | Admitting: Internal Medicine

## 2014-02-04 ENCOUNTER — Other Ambulatory Visit: Payer: Self-pay | Admitting: Internal Medicine

## 2014-02-04 ENCOUNTER — Telehealth: Payer: Self-pay | Admitting: Pulmonary Disease

## 2014-02-04 DIAGNOSIS — J690 Pneumonitis due to inhalation of food and vomit: Secondary | ICD-10-CM

## 2014-02-04 DIAGNOSIS — J9819 Other pulmonary collapse: Secondary | ICD-10-CM | POA: Insufficient documentation

## 2014-02-04 DIAGNOSIS — G809 Cerebral palsy, unspecified: Secondary | ICD-10-CM | POA: Insufficient documentation

## 2014-02-04 DIAGNOSIS — M4120 Other idiopathic scoliosis, site unspecified: Secondary | ICD-10-CM | POA: Insufficient documentation

## 2014-02-04 NOTE — Telephone Encounter (Signed)
Pt's mother called and said Pt was recently seen at another GI office whom she is not satisfied with.  She is working to obtain the records and will have them sent to Dr. Olevia Perches to be reviewed.

## 2014-02-04 NOTE — Progress Notes (Signed)
Quick Note:  Spoke with pt and notified of results per Dr. Wert. Pt verbalized understanding and denied any questions.  ______ 

## 2014-02-04 NOTE — Telephone Encounter (Signed)
Called spoke with pt mother. She reports anytime they feed pt via feeding tube, she will start coughing and throwing up. She thinks pt is having problems with the feeding tube. She wants to know if VS feels comfortable enough to refer her to GI. She feels thism ay be the reason she keeps getting PNA. Please advise Dr. Halford Chessman thanks

## 2014-02-04 NOTE — Telephone Encounter (Signed)
Referral in [place] from Dr. Juanetta Gosling office.  Per Encounter note on 08-08-12 Dr. Olevia Perches agreed to see Pt if Pt obtained notes regarding her PEG tube. The previous GI office has closed down and she cannot get records because it was over 74yrs ago.  Wants confirm Dr. Olevia Perches will accept pt?

## 2014-02-04 NOTE — Telephone Encounter (Signed)
Spoke with Mother Aware that order placed for one of our local GI specialist. ([place]) Pt mother states that feeding tube was originally placed 10+yr ago at [facility] -- Physician no longer works there, cannot follow-up. Pt mother states that she does want to take her daughter back to [facility]--feels that her daughter did not get adequate care from GI Specialists there.   Aware that we will have her set up with GI in [place]. Will send to Dr Halford Chessman as Juluis Rainier.

## 2014-02-04 NOTE — Telephone Encounter (Signed)
It sounds like she needs her PEG changed and start on PPI's for reflux. I will see her.Marland Kitchen

## 2014-02-04 NOTE — Telephone Encounter (Signed)
Dr Brodie   Please advise  

## 2014-02-04 NOTE — Telephone Encounter (Signed)
Noted  

## 2014-02-04 NOTE — Telephone Encounter (Signed)
Okay to refer to GI.  Please also find out from pt's mother who originally placed feeding tube >> she may need to be referred to this provider.

## 2014-02-05 NOTE — Telephone Encounter (Signed)
Spoke w/Dottie and confirmed.  We are waiting on the last GI records to be received to be reviewed by Dr. Olevia Perches before an appt can be scheduled

## 2014-02-19 ENCOUNTER — Encounter: Payer: Self-pay | Admitting: *Deleted

## 2014-02-23 NOTE — Telephone Encounter (Signed)
Records have been reviewed and accepted by Dr. Olevia Perches.  LM on pt vm saying that we needed an authorization from PCP because of [place] so we could make appt for new pt appt with Dr. Olevia Perches

## 2014-02-24 ENCOUNTER — Telehealth: Payer: Self-pay | Admitting: Internal Medicine

## 2014-02-24 NOTE — Telephone Encounter (Signed)
Spoke with patient's mother and the feeding tube is functioning, she just wants Dr.Brodie to evaluate it because it was originally placed 10 years ago. Patient's mother changes it out every 3 months. Needs to establish GI care. Scheduled on 03/09/14 at 9:30 AM.

## 2014-03-09 ENCOUNTER — Encounter: Payer: Self-pay | Admitting: Internal Medicine

## 2014-03-09 ENCOUNTER — Ambulatory Visit (INDEPENDENT_AMBULATORY_CARE_PROVIDER_SITE_OTHER): Payer: 59 | Admitting: Internal Medicine

## 2014-03-09 VITALS — BP 92/60 | HR 104

## 2014-03-09 DIAGNOSIS — R633 Feeding difficulties, unspecified: Secondary | ICD-10-CM

## 2014-03-09 DIAGNOSIS — J387 Other diseases of larynx: Secondary | ICD-10-CM

## 2014-03-09 DIAGNOSIS — K219 Gastro-esophageal reflux disease without esophagitis: Secondary | ICD-10-CM | POA: Insufficient documentation

## 2014-03-09 DIAGNOSIS — G808 Other cerebral palsy: Secondary | ICD-10-CM

## 2014-03-09 DIAGNOSIS — J690 Pneumonitis due to inhalation of food and vomit: Secondary | ICD-10-CM

## 2014-03-09 MED ORDER — OMEPRAZOLE 2 MG/ML ORAL SUSPENSION: 20.0000 mg | Freq: Every day | ORAL | Status: DC

## 2014-03-09 NOTE — Progress Notes (Signed)
Renee [name] 06/26/1986 [id number]  Note: This dictation was prepared with Dragon digital system. Any transcriptional errors that result from this procedure are unintentional.   History of Present Illness:  This is a 28 year old white female with cerebral palsy, epilepsy and scoliosis as well as profound intellectual disability. She has been followed by her pediatrician up until now and has been dependent on percutaneous gastrostomy tube feedings for the past 10 years. Her mother has been changing her gastrostomy every 3 months. She has 14 French 15 mm button gastrostomy. She was hospitalized recently with aspiration pneumonia. She frequently coughs during tube feedings. Her tube feedings consist of Jevity 1.5 ,8 ounces, 3 times a day. Her upper abdominal ultrasound in January 2015 was normal. She has not been on antireflux measures or on acid reducing medications.    Past Medical History  Diagnosis Date  . Cerebral palsy   . Mental retardation   . Hydrocephalus   . Seizure disorder   . Osteopenia   . Hip dysplasia     bilateral  . DNR (do not resuscitate)   . DNI (do not intubate)   . Scoliosis   . Asthma     breathing treatment    Past Surgical History  Procedure Laterality Date  . Spinal fusion  2005  . 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 Laparoscopic nissen fundoplication    . Gastrostomy tube placement      at same time as nissen  . Tympanostomy tube placement    . Ileopsoas release Bilateral     Dr Susy Frizzle  . Tenotomy adductor / hamstring closed Bilateral 04/25/00    Allergies  Allergen Reactions  . Other     Paper and adhesive tape - blisters  . Penicillins     Family history and social history have been reviewed.  Review of Systems: Cough when he. Flexion contractures. No ambulatory  The remainder of the 10 point ROS is negative except as outlined in the H&P  Physical Exam: General Appearance very thin contracted nonverbal female Eyes  Non icteric   HEENT  Non traumatic, somewhat deformed skull Mouth No lesion, tongue papillated, no cheilosis Neck Supple without adenopathy, thyroid not enlarged, no carotid bruits, no JVD Lungs Clear to auscultation bilaterally severe deformity of the rib cage and thoracic spine  COR Normal S1, normal S2, regular rhythm, no murmur, quiet precordium Abdomen protuberant but soft with gastrostomy in left upper quadrant. Gastrostomy site is clean. There is no drainage or leakage. There is no palpable mass. Rectal soft Hemoccult negative stool Extremities  No pedal edema flexion contractures Skin No lesions Neurological nonverbal. Does not focus. Non ambulatory Psychological makes noises. Cries. No communication  Assessment and Plan:   Problem #64 28 year old white female with cerebral palsy and profound mental retardation who is dependent on tube feedings. Her gastrostomy is functioning appropriately and does not have to be changed. I asked the mother  to change it every 6-12 months .  Problem #2 Gastroesophageal reflux, when necessary aspiration may be due to supine positioning as well as possible gastroparesis and incompetent LES.. We will assess her gastric emptying with liquid phase  gastric emptying scan in nuclear medicine. Via PEG. She will  sit up for at least 60 minutes after each  tube feeding. The tube feedings have been adjusted to administer 4 ounces at a time  Instead of 8 oz. She will receive  4 oz every 3 hours for a total  of 3 cans a day (at 7 AM 10 AM 1 PM 4 PM 7 PM and 10 PM, total of 1050 calories). She will begin omeprazole liquid 2 teaspoon daily = 20 mg every 24 hours per PEG and have an office visit in 3 months.    [name] 03/09/2014

## 2014-03-09 NOTE — Patient Instructions (Addendum)
Please follow up with Dr Olevia Perches in 3 months.  We have sent the following medications to your pharmacy for you to pick up at your convenience: Omeprazole 20 mg (10 ml) via PEG daily.  We have provided antireflux measures for you to look over.  Take Jevity 1/2 can at 7 am, 10 am, 1 pm, 4 pm, 7 pm and 10 pm daily.  We will contact you regarding gastric emptying scan.  CC: Dr Sharilyn Sites, Dr Gaynell Face,

## 2014-03-10 ENCOUNTER — Telehealth: Payer: Self-pay | Admitting: *Deleted

## 2014-03-10 MED ORDER — RANITIDINE HCL 150 MG/10ML PO SYRP: 300.0000 mg | ORAL_SOLUTION | Freq: Every day | ORAL | Status: DC

## 2014-03-10 NOTE — Telephone Encounter (Signed)
Patient's insurance (phone [phone]) has denied patient's omeprazole suspension even with information given that she is strictly on PEG tube feedings and has no oral intake. Per insurance, they will cover ranitidine or famotidine suspension. Dr Olevia Perches, please advise.Marland KitchenMarland Kitchen

## 2014-03-10 NOTE — Telephone Encounter (Signed)
Rx sent 

## 2014-03-10 NOTE — Telephone Encounter (Signed)
Ranitidine suspension 300mg  per PEG qd,?12 oz, 10 refills

## 2014-03-15 ENCOUNTER — Other Ambulatory Visit (HOSPITAL_COMMUNITY): Payer: 59

## 2014-03-16 ENCOUNTER — Encounter (HOSPITAL_COMMUNITY)
Admission: RE | Admit: 2014-03-16 | Discharge: 2014-03-16 | Disposition: A | Discharge status: Home / Self Care | Payer: 59 | Source: Ambulatory Visit | Attending: Internal Medicine | Admitting: Internal Medicine

## 2014-03-16 DIAGNOSIS — J690 Pneumonitis due to inhalation of food and vomit: Secondary | ICD-10-CM | POA: Insufficient documentation

## 2014-03-17 MED ORDER — TECHNETIUM TC 99M SULFUR COLLOID
2.0000 | Freq: Once | INTRAVENOUS | Status: AC | PRN
Start: 2014-03-17 — End: 2014-03-16
  Administered 2014-03-16: 2 via INTRAVENOUS

## 2014-03-24 ENCOUNTER — Encounter: Payer: Self-pay | Admitting: Internal Medicine

## 2014-03-25 ENCOUNTER — Other Ambulatory Visit: Payer: Self-pay | Admitting: Pulmonary Disease

## 2014-03-30 ENCOUNTER — Encounter: Payer: Self-pay | Admitting: Adult Health

## 2014-03-30 ENCOUNTER — Ambulatory Visit (INDEPENDENT_AMBULATORY_CARE_PROVIDER_SITE_OTHER): Payer: 59 | Admitting: Adult Health

## 2014-03-30 DIAGNOSIS — Z7689 Persons encountering health services in other specified circumstances: Secondary | ICD-10-CM

## 2014-03-30 MED ORDER — MEDROXYPROGESTERONE ACETATE 150 MG/ML IM SUSP
150.0000 mg | Freq: Once | INTRAMUSCULAR | Status: AC
  Administered 2014-03-30: 150 mg via INTRAMUSCULAR

## 2014-03-30 NOTE — Addendum Note (Signed)
Addended by: Derrek Monaco A on: 03/30/2014 05:31 PM   Modules accepted: Level of Service

## 2014-04-26 ENCOUNTER — Inpatient Hospital Stay (HOSPITAL_COMMUNITY)
Admission: EM | Admit: 2014-04-26 | Discharge: 2014-04-30 | DRG: 871 | Disposition: A | Discharge status: Home / Self Care | Payer: 59 | Attending: Internal Medicine | Admitting: Internal Medicine

## 2014-04-26 ENCOUNTER — Emergency Department (HOSPITAL_COMMUNITY): Payer: 59

## 2014-04-26 ENCOUNTER — Encounter (HOSPITAL_COMMUNITY): Payer: Self-pay | Admitting: Emergency Medicine

## 2014-04-26 ENCOUNTER — Inpatient Hospital Stay (HOSPITAL_COMMUNITY): Payer: 59

## 2014-04-26 DIAGNOSIS — A401 Sepsis due to streptococcus, group B: Secondary | ICD-10-CM | POA: Diagnosis present

## 2014-04-26 DIAGNOSIS — J189 Pneumonia, unspecified organism: Secondary | ICD-10-CM

## 2014-04-26 DIAGNOSIS — Z931 Gastrostomy status: Secondary | ICD-10-CM | POA: Diagnosis not present

## 2014-04-26 DIAGNOSIS — N390 Urinary tract infection, site not specified: Secondary | ICD-10-CM | POA: Diagnosis present

## 2014-04-26 DIAGNOSIS — L03317 Cellulitis of buttock: Secondary | ICD-10-CM | POA: Diagnosis present

## 2014-04-26 DIAGNOSIS — L03115 Cellulitis of right lower limb: Secondary | ICD-10-CM | POA: Diagnosis present

## 2014-04-26 DIAGNOSIS — G808 Other cerebral palsy: Secondary | ICD-10-CM | POA: Diagnosis present

## 2014-04-26 DIAGNOSIS — Z66 Do not resuscitate: Secondary | ICD-10-CM | POA: Diagnosis present

## 2014-04-26 DIAGNOSIS — Z91040 Latex allergy status: Secondary | ICD-10-CM

## 2014-04-26 DIAGNOSIS — Z79899 Other long term (current) drug therapy: Secondary | ICD-10-CM

## 2014-04-26 DIAGNOSIS — J387 Other diseases of larynx: Secondary | ICD-10-CM | POA: Diagnosis present

## 2014-04-26 DIAGNOSIS — G919 Hydrocephalus, unspecified: Secondary | ICD-10-CM | POA: Diagnosis present

## 2014-04-26 DIAGNOSIS — R451 Restlessness and agitation: Secondary | ICD-10-CM | POA: Diagnosis present

## 2014-04-26 DIAGNOSIS — K219 Gastro-esophageal reflux disease without esophagitis: Secondary | ICD-10-CM | POA: Diagnosis present

## 2014-04-26 DIAGNOSIS — E43 Unspecified severe protein-calorie malnutrition: Secondary | ICD-10-CM | POA: Diagnosis present

## 2014-04-26 DIAGNOSIS — A419 Sepsis, unspecified organism: Secondary | ICD-10-CM | POA: Diagnosis present

## 2014-04-26 DIAGNOSIS — M2450 Contracture, unspecified joint: Secondary | ICD-10-CM | POA: Diagnosis present

## 2014-04-26 DIAGNOSIS — R197 Diarrhea, unspecified: Secondary | ICD-10-CM | POA: Diagnosis present

## 2014-04-26 DIAGNOSIS — G40909 Epilepsy, unspecified, not intractable, without status epilepticus: Secondary | ICD-10-CM | POA: Diagnosis present

## 2014-04-26 DIAGNOSIS — Z9889 Other specified postprocedural states: Secondary | ICD-10-CM | POA: Diagnosis not present

## 2014-04-26 DIAGNOSIS — Z880 Allergy status to penicillin: Secondary | ICD-10-CM

## 2014-04-26 DIAGNOSIS — L03119 Cellulitis of unspecified part of limb: Secondary | ICD-10-CM | POA: Diagnosis present

## 2014-04-26 DIAGNOSIS — E876 Hypokalemia: Secondary | ICD-10-CM | POA: Diagnosis not present

## 2014-04-26 DIAGNOSIS — Z91048 Other nonmedicinal substance allergy status: Secondary | ICD-10-CM | POA: Diagnosis not present

## 2014-04-26 DIAGNOSIS — M8580 Other specified disorders of bone density and structure, unspecified site: Secondary | ICD-10-CM | POA: Diagnosis present

## 2014-04-26 DIAGNOSIS — J45909 Unspecified asthma, uncomplicated: Secondary | ICD-10-CM | POA: Diagnosis present

## 2014-04-26 DIAGNOSIS — Z681 Body mass index (BMI) 19 or less, adult: Secondary | ICD-10-CM

## 2014-04-26 DIAGNOSIS — Q6589 Other specified congenital deformities of hip: Secondary | ICD-10-CM | POA: Diagnosis not present

## 2014-04-26 DIAGNOSIS — L03116 Cellulitis of left lower limb: Secondary | ICD-10-CM | POA: Diagnosis present

## 2014-04-26 DIAGNOSIS — F72 Severe intellectual disabilities: Secondary | ICD-10-CM | POA: Diagnosis present

## 2014-04-26 DIAGNOSIS — G809 Cerebral palsy, unspecified: Secondary | ICD-10-CM

## 2014-04-26 DIAGNOSIS — M419 Scoliosis, unspecified: Secondary | ICD-10-CM | POA: Diagnosis present

## 2014-04-26 DIAGNOSIS — Z23 Encounter for immunization: Secondary | ICD-10-CM

## 2014-04-26 LAB — CBC WITH DIFFERENTIAL/PLATELET
BASOS ABS: 0 10*3/uL (ref 0.0–0.1)
Basophils Relative: 0 % (ref 0–1)
EOS PCT: 0 % (ref 0–5)
Eosinophils Absolute: 0 10*3/uL (ref 0.0–0.7)
HEMATOCRIT: 45.3 % (ref 36.0–46.0)
Hemoglobin: 15.4 g/dL — ABNORMAL HIGH (ref 12.0–15.0)
LYMPHS ABS: 0.7 10*3/uL (ref 0.7–4.0)
Lymphocytes Relative: 5 % — ABNORMAL LOW (ref 12–46)
MCH: 33.9 pg (ref 26.0–34.0)
MCHC: 34 g/dL (ref 30.0–36.0)
MCV: 99.8 fL (ref 78.0–100.0)
MONO ABS: 1 10*3/uL (ref 0.1–1.0)
Monocytes Relative: 7 % (ref 3–12)
NEUTROS ABS: 12.9 10*3/uL — AB (ref 1.7–7.7)
Neutrophils Relative %: 88 % — ABNORMAL HIGH (ref 43–77)
PLATELETS: 164 10*3/uL (ref 150–400)
RBC: 4.54 MIL/uL (ref 3.87–5.11)
RDW: 13.5 % (ref 11.5–15.5)
WBC: 14.6 10*3/uL — ABNORMAL HIGH (ref 4.0–10.5)

## 2014-04-26 LAB — COMPREHENSIVE METABOLIC PANEL
ALK PHOS: 153 U/L — AB (ref 39–117)
ALT: 19 U/L (ref 0–35)
AST: 23 U/L (ref 0–37)
Albumin: 3.1 g/dL — ABNORMAL LOW (ref 3.5–5.2)
Anion gap: 13 (ref 5–15)
BILIRUBIN TOTAL: 0.4 mg/dL (ref 0.3–1.2)
BUN: 5 mg/dL — ABNORMAL LOW (ref 6–23)
CHLORIDE: 88 meq/L — AB (ref 96–112)
CO2: 28 mEq/L (ref 19–32)
Calcium: 8.4 mg/dL (ref 8.4–10.5)
Creatinine, Ser: <0.2 mg/dL — ABNORMAL LOW (ref 0.50–1.10)
GLUCOSE: 88 mg/dL (ref 70–99)
Potassium: 3.5 mEq/L — ABNORMAL LOW (ref 3.7–5.3)
SODIUM: 129 meq/L — AB (ref 137–147)
Total Protein: 7.2 g/dL (ref 6.0–8.3)

## 2014-04-26 LAB — URINALYSIS, ROUTINE W REFLEX MICROSCOPIC
Glucose, UA: NEGATIVE mg/dL
KETONES UR: NEGATIVE mg/dL
Nitrite: NEGATIVE
PH: 6 (ref 5.0–8.0)
Specific Gravity, Urine: 1.017 (ref 1.005–1.030)
Urobilinogen, UA: 1 mg/dL (ref 0.0–1.0)

## 2014-04-26 LAB — URINE MICROSCOPIC-ADD ON

## 2014-04-26 LAB — LACTIC ACID, PLASMA: Lactic Acid, Venous: 1.9 mmol/L (ref 0.5–2.2)

## 2014-04-26 MED ORDER — ACETAMINOPHEN 325 MG PO TABS: 650.0000 mg | ORAL_TABLET | Freq: Four times a day (QID) | ORAL | Status: DC | PRN

## 2014-04-26 MED ORDER — ACETAMINOPHEN 160 MG/5ML PO SOLN
650.0000 mg | Freq: Four times a day (QID) | ORAL | Status: DC | PRN
  Administered 2014-04-28: 650 mg
  Filled 2014-04-26: qty 20.3

## 2014-04-26 MED ORDER — RISPERIDONE 0.5 MG PO TABS
0.5000 mg | ORAL_TABLET | Freq: Every day | ORAL | Status: DC
  Filled 2014-04-26: qty 1

## 2014-04-26 MED ORDER — BACLOFEN 20 MG PO TABS
20.0000 mg | ORAL_TABLET | ORAL | Status: DC
  Administered 2014-04-26 – 2014-04-30 (×11): 20 mg
  Filled 2014-04-26 (×12): qty 1

## 2014-04-26 MED ORDER — BUDESONIDE 0.25 MG/2ML IN SUSP
0.2500 mg | Freq: Every day | RESPIRATORY_TRACT | Status: DC
  Administered 2014-04-27: 0.25 mg via RESPIRATORY_TRACT
  Filled 2014-04-26: qty 2

## 2014-04-26 MED ORDER — BACLOFEN 10 MG PO TABS: 10.0000 mg | ORAL_TABLET | ORAL | Status: DC

## 2014-04-26 MED ORDER — RANITIDINE HCL 150 MG/10ML PO SYRP
300.0000 mg | ORAL_SOLUTION | Freq: Every day | ORAL | Status: DC
  Administered 2014-04-27 – 2014-04-29 (×3): 300 mg
  Filled 2014-04-26 (×5): qty 20

## 2014-04-26 MED ORDER — BACLOFEN 10 MG PO TABS: 10.0000 mg | ORAL_TABLET | Freq: Four times a day (QID) | ORAL | Status: DC

## 2014-04-26 MED ORDER — INFLUENZA VAC SPLIT QUAD 0.5 ML IM SUSY: 0.5000 mL | PREFILLED_SYRINGE | INTRAMUSCULAR | Status: DC | PRN

## 2014-04-26 MED ORDER — VANCOMYCIN HCL IN DEXTROSE 1-5 GM/200ML-% IV SOLN: 1000.0000 mg | Freq: Once | INTRAVENOUS | Status: DC

## 2014-04-26 MED ORDER — JEVITY 1.5 CAL/FIBER PO LIQD
237.0000 mL | Freq: Four times a day (QID) | ORAL | Status: DC
  Administered 2014-04-26: 237 mL
  Filled 2014-04-26 (×5): qty 1000

## 2014-04-26 MED ORDER — BACLOFEN 10 MG PO TABS
10.0000 mg | ORAL_TABLET | ORAL | Status: DC
  Administered 2014-04-27 – 2014-04-29 (×3): 10 mg
  Filled 2014-04-26 (×4): qty 1

## 2014-04-26 MED ORDER — SODIUM CHLORIDE 0.9 % IV SOLN
INTRAVENOUS | Status: DC
  Administered 2014-04-26: 1000 mL via INTRAVENOUS
  Administered 2014-04-27 – 2014-04-28 (×2): via INTRAVENOUS

## 2014-04-26 MED ORDER — DEXTROSE 5 % IV SOLN
1.0000 g | Freq: Three times a day (TID) | INTRAVENOUS | Status: DC
  Administered 2014-04-27: 1 g via INTRAVENOUS
  Filled 2014-04-26 (×2): qty 1

## 2014-04-26 MED ORDER — ACETYLCYSTEINE 20 % IN SOLN
2.0000 mL | RESPIRATORY_TRACT | Status: DC | PRN
  Administered 2014-04-27: 2 mL via RESPIRATORY_TRACT
  Filled 2014-04-26: qty 4

## 2014-04-26 MED ORDER — ACETAMINOPHEN 650 MG RE SUPP
650.0000 mg | Freq: Four times a day (QID) | RECTAL | Status: DC | PRN
Start: 2014-04-26 — End: 2014-04-30

## 2014-04-26 MED ORDER — SIMETHICONE 40 MG/0.6ML PO SUSP
40.0000 mg | Freq: Three times a day (TID) | ORAL | Status: DC | PRN
  Filled 2014-04-26: qty 0.6

## 2014-04-26 MED ORDER — DEXTROSE 5 % IV SOLN
2.0000 g | Freq: Once | INTRAVENOUS | Status: AC
  Administered 2014-04-26: 2 g via INTRAVENOUS
  Filled 2014-04-26: qty 2

## 2014-04-26 MED ORDER — SODIUM CHLORIDE 0.9 % IV BOLUS (SEPSIS)
2000.0000 mL | Freq: Once | INTRAVENOUS | Status: AC
  Administered 2014-04-26: 2000 mL via INTRAVENOUS

## 2014-04-26 MED ORDER — PRIMIDONE 50 MG PO TABS
200.0000 mg | ORAL_TABLET | Freq: Three times a day (TID) | ORAL | Status: DC
  Administered 2014-04-26 – 2014-04-30 (×11): 200 mg
  Filled 2014-04-26 (×16): qty 4

## 2014-04-26 MED ORDER — HEPARIN SODIUM (PORCINE) 5000 UNIT/ML IJ SOLN
5000.0000 [IU] | Freq: Three times a day (TID) | INTRAMUSCULAR | Status: DC
  Administered 2014-04-26 – 2014-04-28 (×5): 5000 [IU] via SUBCUTANEOUS
  Filled 2014-04-26 (×5): qty 1

## 2014-04-26 MED ORDER — LEVOFLOXACIN IN D5W 750 MG/150ML IV SOLN
750.0000 mg | Freq: Once | INTRAVENOUS | Status: AC
  Administered 2014-04-26: 750 mg via INTRAVENOUS
  Filled 2014-04-26: qty 150

## 2014-04-26 MED ORDER — SODIUM CHLORIDE 0.9 % IJ SOLN
3.0000 mL | Freq: Two times a day (BID) | INTRAMUSCULAR | Status: DC
  Administered 2014-04-29: 3 mL via INTRAVENOUS

## 2014-04-26 MED ORDER — ONDANSETRON HCL 4 MG/2ML IJ SOLN: 4.0000 mg | Freq: Four times a day (QID) | INTRAMUSCULAR | Status: DC | PRN

## 2014-04-26 MED ORDER — RISPERIDONE 1 MG/ML PO SOLN
0.5000 mg | ORAL | Status: DC
  Administered 2014-04-27 – 2014-04-29 (×3): 0.5 mg
  Filled 2014-04-26 (×5): qty 0.5

## 2014-04-26 MED ORDER — SODIUM CHLORIDE 0.9 % IV BOLUS (SEPSIS)
1000.0000 mL | Freq: Once | INTRAVENOUS | Status: AC
  Administered 2014-04-26: 1000 mL via INTRAVENOUS

## 2014-04-26 MED ORDER — ONDANSETRON HCL 4 MG PO TABS: 4.0000 mg | ORAL_TABLET | Freq: Four times a day (QID) | ORAL | Status: DC | PRN

## 2014-04-26 MED ORDER — ALBUTEROL SULFATE (2.5 MG/3ML) 0.083% IN NEBU
2.5000 mg | INHALATION_SOLUTION | RESPIRATORY_TRACT | Status: DC | PRN
  Administered 2014-04-27 – 2014-04-28 (×2): 2.5 mg via RESPIRATORY_TRACT
  Filled 2014-04-26 (×2): qty 3

## 2014-04-26 MED ORDER — ACETAMINOPHEN 650 MG RE SUPP
650.0000 mg | Freq: Once | RECTAL | Status: AC
  Administered 2014-04-26: 650 mg via RECTAL
  Filled 2014-04-26: qty 1

## 2014-04-26 MED ORDER — SODIUM CHLORIDE 0.9 % IV SOLN
500.0000 mg | Freq: Once | INTRAVENOUS | Status: AC
  Administered 2014-04-26: 500 mg via INTRAVENOUS
  Filled 2014-04-26: qty 500

## 2014-04-26 MED ORDER — RISPERIDONE 1 MG/ML PO SOLN: 0.5000 mg | ORAL | Status: DC

## 2014-04-26 MED ORDER — TIZANIDINE HCL 2 MG PO TABS
2.0000 mg | ORAL_TABLET | Freq: Every day | ORAL | Status: DC
  Administered 2014-04-27 – 2014-04-28 (×2): 2 mg
  Filled 2014-04-26 (×2): qty 1

## 2014-04-26 MED ORDER — BACLOFEN 20 MG PO TABS: 20.0000 mg | ORAL_TABLET | ORAL | Status: DC

## 2014-04-26 MED ORDER — LEVOFLOXACIN IN D5W 500 MG/100ML IV SOLN: 500.0000 mg | INTRAVENOUS | Status: DC

## 2014-04-26 MED ORDER — RISPERIDONE 1 MG/ML PO SOLN
1.0000 mg | Freq: Every evening | ORAL | Status: DC
  Administered 2014-04-26 – 2014-04-29 (×4): 1 mg
  Filled 2014-04-26 (×5): qty 1

## 2014-04-26 MED ORDER — SODIUM CHLORIDE 0.9 % IV SOLN
500.0000 mg | Freq: Two times a day (BID) | INTRAVENOUS | Status: DC
  Administered 2014-04-27 – 2014-04-28 (×3): 500 mg via INTRAVENOUS
  Filled 2014-04-26 (×3): qty 500

## 2014-04-26 MED ORDER — SODIUM CHLORIDE 0.9 % IV SOLN
1000.0000 mL | INTRAVENOUS | Status: DC
  Administered 2014-04-26: 1000 mL via INTRAVENOUS

## 2014-04-26 NOTE — ED Notes (Signed)
Pt has cerebral palsy and was sent from Dr Hilma Favors office due to fever, hematuria since Saturday.  Pt has rash that started a couple of hours ago.  Pt has been taking motrin for fever but was 103 in the doctor's office. The doctor sent her here for possible sepsis.

## 2014-04-26 NOTE — Progress Notes (Signed)
  CARE MANAGEMENT ED NOTE 04/26/2014  Patient:  TULANI, KIDNEY RAE   Account Number:  [id number]  Date Initiated:  04/26/2014  Documentation initiated by:  Livia Snellen  Subjective/Objective Assessment:   Patient presents to Ed with hematuria, rash, fever possible sepsis     Subjective/Objective Assessment Detail:   Patient with pmhx of cerbral palsy, mental retardation, hydrocephalus, scoliosis, hio dysplasia, asthma.  WBC 14.6     Action/Plan:   Action/Plan Detail:   Anticipated DC Date:       Status Recommendation to Physician:   Result of Recommendation:    Other ED Services  Consult Working [place]  CM consult  Other    Choice offered to / List presented to:           [place]    Status of service:  Completed, signed off  ED Comments:   ED Comments Detail:  EDCM spoke to patient's mother at bedside.  Patient lives at home with her mother.  Patient's mother confirms patient is active with home health services by [facility].  Patient's mother reports patient receives, respite and personal care services from [facility] and a RN comes out once a month. Patient has oxygen, nebulizer, hospital bed, wheelchair at home.  Patient's mother reports patient only wears oxygen at home when she needs it and is supplied by [facility].  Patient's mother reports the patient does not walk. Patient's mother reports she feels she has everything she needs at this time for the patient at home.  No further EDCM needs at this time.

## 2014-04-26 NOTE — ED Notes (Signed)
Called to give report to stepdown. Nurse wants me to call back.

## 2014-04-26 NOTE — ED Notes (Signed)
MD at bedside. 

## 2014-04-26 NOTE — ED Provider Notes (Signed)
CSN: [id number]     Arrival date & time 04/26/14  1417 History   First MD Initiated Contact with Patient 04/26/14 1448     Chief Complaint  Patient presents with  . Fever  . Rash  . Hematuria     (Consider location/radiation/quality/duration/timing/severity/associated sxs/prior Treatment) Patient is a 28 y.o. female presenting with fever, rash, and hematuria.  Fever Max temp prior to arrival:  104 Temp source:  Rectal Severity:  Moderate Onset quality:  Gradual Duration:  2 days Timing:  Constant Progression:  Worsening Chronicity:  Recurrent Relieved by:  Nothing Worsened by:  Nothing tried Ineffective treatments:  None tried Associated symptoms: congestion, cough and rash (bil hips)   Rash Associated symptoms: fever   Hematuria    Past Medical History  Diagnosis Date  . Cerebral palsy   . Mental retardation   . Hydrocephalus   . Seizure disorder   . Osteopenia   . Hip dysplasia     bilateral  . DNR (do not resuscitate)   . DNI (do not intubate)   . Scoliosis   . Asthma     breathing treatment   Past Surgical History  Procedure Laterality Date  . Spinal fusion  2005  . 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 Laparoscopic nissen fundoplication    . Gastrostomy tube placement      at same time as nissen  . Tympanostomy tube placement    . Ileopsoas release Bilateral     Dr Susy Frizzle  . Tenotomy adductor / hamstring closed Bilateral 04/25/00   Family History  Problem Relation Age of Onset  . Breast cancer Maternal Grandmother   . Heart disease Maternal Grandfather   . Thyroid disease Maternal Grandfather   . Thyroid disease Maternal Aunt    History  Substance Use Topics  . Smoking status: Never Smoker   . Smokeless tobacco: Never Used  . Alcohol Use: No   OB History   Grav Para Term Preterm Abortions TAB SAB Ect Mult Living                 Review of Systems  Constitutional: Positive for fever.  HENT: Positive for congestion.   Respiratory:  Positive for cough.   Genitourinary: Positive for hematuria.  Skin: Positive for rash (bil hips).  All other systems reviewed and are negative.     Allergies  Other; Penicillins; and Latex  Home Medications   Prior to Admission medications   Medication Sig Start Date End Date Taking? Authorizing Provider  acetylcysteine (MUCOMYST) 20 % nebulizer solution Take 2 mLs by nebulization every 4 (four) hours as needed. 05/01/13  Yes [name], MD  albuterol (PROVENTIL) (2.5 MG/3ML) 0.083% nebulizer solution USE 1 VIAL IN NEBULIZER FOUR TIMES DAILY AS DIRECTED. prn 03/25/14  Yes [name], MD  baclofen (LIORESAL) 10 MG tablet Take 10-20 mg by mouth 4 (four) times daily. Take 2 tablets (20mg ) at 0700. Take 1 tablet (10mg ) at 1100. Take 2 tablets (20mg ) at 1500. Take 2 tablets (20mg ) at 2000.   Yes Historical Provider, MD  hydrocortisone cream 0.5 % Apply 1 application topically daily as needed for itching (around feeding tube).   Yes Historical Provider, MD  medroxyPROGESTERone (DEPO-PROVERA) 150 MG/ML injection Inject 1 mL (150 mg total) into the muscle every 3 (three) months. 10/16/13  Yes Estill Dooms, NP  Nutritional Supplements (FEEDING SUPPLEMENT, JEVITY 1.5 CAL/FIBER,) LIQD Place 237 mLs into feeding tube 4 (four) times daily. 1  can at 0700, 1100, 1500, and 2000.   Yes Historical Provider, MD  polyethylene glycol powder (MIRALAX) powder Take 17 g by mouth daily as needed for moderate constipation.    Yes Historical Provider, MD  primidone (MYSOLINE) 50 MG tablet Take 4 tablets at 0700, 1500, and 2000.   Yes Historical Provider, MD  PULMICORT 0.25 MG/2ML nebulizer solution USE 1 VIAL PER NEBULIZER TWICE DAILY.   Yes [name], MD  ranitidine (ZANTAC) 150 MG/10ML syrup Place 20 mLs (300 mg total) into feeding tube daily. 03/10/14  Yes Lafayette Dragon, MD  [name] (RISPERDAL) 0.5 MG tablet Take 1 tablet (0.5mg ) at 1100. Take 2 tablets (1mg ) at 1900.   Yes Historical Provider, MD   tiZANidine (ZANAFLEX) 2 MG tablet Take 2 mg by mouth once. Take 1 tablet at 0700.   Yes Historical Provider, MD  Simethicone (GAS-X INFANT DROPS) 40 MG/0.6ML LIQD Give 40 mg by tube 3 (three) times daily as needed (gas).     Historical Provider, MD   BP 121/58  Pulse 111  Temp(Src) 99.9 F (37.7 C) (Core (Comment))  Resp 21  Ht 4' (1.219 m)  Wt 61 lb 1.1 oz (27.7 kg)  BMI 18.64 kg/m2  SpO2 91% Physical Exam  Vitals reviewed. Constitutional: She appears well-developed and well-nourished.  HENT:  Head: Normocephalic and atraumatic.  Right Ear: External ear normal.  Left Ear: External ear normal.  Eyes: Conjunctivae and EOM are normal. Pupils are equal, round, and reactive to light.  Neck: Normal range of motion. Neck supple.  Cardiovascular: Normal rate, regular rhythm, normal heart sounds and intact distal pulses.   Pulmonary/Chest: Effort normal and breath sounds normal.  Abdominal: Soft. Bowel sounds are normal. There is no tenderness.  Musculoskeletal: Normal range of motion.  Neurological: She is alert.  CP, spastic generally with increased tone  Skin: Skin is warm and dry.  Blanching erythematous rash over bilateral hips without obvious skin breakdown    ED Course  Procedures (including critical care time) Labs Review Labs Reviewed  CBC WITH DIFFERENTIAL - Abnormal; Notable for the following:    WBC 14.6 (*)    Hemoglobin 15.4 (*)    Neutrophils Relative % 88 (*)    Neutro Abs 12.9 (*)    Lymphocytes Relative 5 (*)    All other components within normal limits  COMPREHENSIVE METABOLIC PANEL - Abnormal; Notable for the following:    Sodium 129 (*)    Potassium 3.5 (*)    Chloride 88 (*)    BUN 5 (*)    Creatinine, Ser <0.20 (*)    Albumin 3.1 (*)    Alkaline Phosphatase 153 (*)    All other components within normal limits  URINALYSIS, ROUTINE W REFLEX MICROSCOPIC - Abnormal; Notable for the following:    Color, Urine RED (*)    APPearance TURBID (*)    Hgb  urine dipstick LARGE (*)    Bilirubin Urine SMALL (*)    Protein, ur >300 (*)    Leukocytes, UA LARGE (*)    All other components within normal limits  URINE MICROSCOPIC-ADD ON - Abnormal; Notable for the following:    Squamous Epithelial / LPF MANY (*)    Bacteria, UA MANY (*)    All other components within normal limits  URINE CULTURE  CULTURE, BLOOD (ROUTINE X 2)  CULTURE, BLOOD (ROUTINE X 2)  CLOSTRIDIUM DIFFICILE BY PCR  MRSA PCR SCREENING  LACTIC ACID, PLASMA  BASIC METABOLIC PANEL  CBC  Imaging Review Abd 1 View (kub)  04/26/2014   CLINICAL DATA:  Abdominal pain.  History of cervical palsy.  EXAM: ABDOMEN - 1 VIEW  COMPARISON:  Abdominal CT 08/02/2010.  FINDINGS: Generalized colonic distention is similar to priors. There is no evidence of bowel wall thickening, pneumatosis or free intraperitoneal air. Harrington rods, ventriculoperitoneal shunt, scoliosis and findings of bilateral congenital hip dysplasia are noted.  IMPRESSION: No definite acute findings. Colonic distention is similar to priors.   Electronically Signed   By: Camie Patience M.D.   On: 04/26/2014 21:11   Dg Chest Portable 1 View  04/26/2014   CLINICAL DATA:  Fever, hematuria, vomiting.  EXAM: PORTABLE CHEST - 1 VIEW  COMPARISON:  Single view of the chest 02/04/2014.  FINDINGS: Lungs are clear. Heart size is normal. No pneumothorax or pleural effusion. Spinal stabilization hardware in ventriculostomy shunt catheter are again seen. No acute abnormality.  IMPRESSION: No acute disease.   Electronically Signed   By: Inge Rise M.D.   On: 04/26/2014 15:45     EKG Interpretation   Date/Time:  Monday April 26 2014 15:03:50 EDT Ventricular Rate:  145 PR Interval:  144 QRS Duration: 72 QT Interval:  268 QTC Calculation: 416 R Axis:   124 Text Interpretation:  Sinus tachycardia Probable left atrial enlargement  Right axis deviation Low voltage, extremity leads Borderline  repolarization abnormality No  significant change was found Confirmed by  Debby Freiberg [phone]) on 04/26/2014 4:06:30 PM     CRITICAL CARE Performed by: Debby Freiberg   Total critical care time: 19  Critical care time was exclusive of separately billable procedures and treating other patients.  Critical care was necessary to treat or prevent imminent or life-threatening deterioration.  Critical care was time spent personally by me on the following activities: development of treatment plan with patient and/or surrogate as well as nursing, discussions with consultants, evaluation of patient's response to treatment, examination of patient, obtaining history from patient or surrogate, ordering and performing treatments and interventions, ordering and review of laboratory studies, ordering and review of radiographic studies, pulse oximetry and re-evaluation of patient's condition.  MDM   Final diagnoses:  Sepsis, due to unspecified organism  UTI (lower urinary tract infection)    28 y.o. female with pertinent PMH of cp, hydrocephalus, MR presents with rash, fever, hematuria x 2 days.  Pt has a ho urosepsis.  She was seen today by PCP, was tachycardic, febrile, and hypoxic, so sent here.  On arrival vitals as above.  Sepsis order set initiated with unknown source with PCN allergy (levaquin, vanc, aztreonam).  UA with signs of infection, CXR clear.  Pt still tachycardic despite 2L NS bolus.  Pt admitted to stepdown for urosepsis.   1. Sepsis, due to unspecified organism   2. UTI (lower urinary tract infection)         Debby Freiberg, MD 04/27/14 [id number]

## 2014-04-26 NOTE — Progress Notes (Signed)
Utilization Review completed.  Jeannene Tschetter RN CM  

## 2014-04-26 NOTE — H&P (Signed)
Patient Demographics  Renee [name], is a 28 y.o. female  MRN: [id number]   DOB - [birth year]  Admit Date - 04/26/2014  Outpatient Primary MD for the patient is Purvis Kilts, MD   With History of -  Past Medical History  Diagnosis Date  . Cerebral palsy   . Mental retardation   . Hydrocephalus   . Seizure disorder   . Osteopenia   . Hip dysplasia     bilateral  . DNR (do not resuscitate)   . DNI (do not intubate)   . Scoliosis   . Asthma     breathing treatment      Past Surgical History  Procedure Laterality Date  . Spinal fusion  2005  . 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 Laparoscopic nissen fundoplication    . Gastrostomy tube placement      at same time as nissen  . Tympanostomy tube placement    . Ileopsoas release Bilateral     Dr Susy Frizzle  . Tenotomy adductor / hamstring closed Bilateral 04/25/00    in for   Chief Complaint  Patient presents with  . Fever  . Rash  . Hematuria     HPI  Renee [name]  is a 28 y.o. female, with known history of cerebral palsy, quadriplegia, severely contracted, with severe intellectual disability , who was brought by her mother secondary to fever of 100.4 at home, and 103 at 70 office,, hematuria/dark-colored urine, and lethargy, as well mother reports patient has been having diarrhea, over the last 2 days, patient's symptoms started on Saturday 10/2, NAD patient was tachycardic in the 130s, patient urin analysis  was positive for UTI, chest x-ray did not show any opacity or infiltrate, as well she was noticed to have hip and buttocks cellulitis  on physical exam, patient was started on broad-spectrum IV antibiotics, after having blood cultures were sent, hospitalist requested to admit the patient.    Review of Systems    Patient can't provide any review of system giving him severe intellectual disability, mother reports patient has fever, dark urine, diarrhea.   Social History History  Substance Use  Topics  . Smoking status: Never Smoker   . Smokeless tobacco: Never Used  . Alcohol Use: No     Family History Family History  Problem Relation Age of Onset  . Breast cancer Maternal Grandmother   . Heart disease Maternal Grandfather   . Thyroid disease Maternal Grandfather   . Thyroid disease Maternal Aunt     Prior to Admission medications   Medication Sig Start Date End Date Taking? Authorizing Provider  acetylcysteine (MUCOMYST) 20 % nebulizer solution Take 2 mLs by nebulization every 4 (four) hours as needed. 05/01/13  Yes [name], MD  albuterol (PROVENTIL) (2.5 MG/3ML) 0.083% nebulizer solution USE 1 VIAL IN NEBULIZER FOUR TIMES DAILY AS DIRECTED. prn 03/25/14  Yes [name], MD  baclofen (LIORESAL) 10 MG tablet Take 10-20 mg by mouth 4 (four) times daily. Take 2 tablets (20mg ) at 0700. Take 1 tablet (10mg ) at 1100. Take 2 tablets (20mg ) at 1500. Take 2 tablets (20mg ) at 2000.   Yes Historical Provider, MD  hydrocortisone cream 0.5 % Apply 1 application topically daily as needed for itching (around feeding tube).   Yes Historical Provider, MD  medroxyPROGESTERone (DEPO-PROVERA) 150 MG/ML injection Inject 1 mL (150 mg total) into the muscle every 3 (three) months. 10/16/13  Yes Estill Dooms, NP  Nutritional Supplements (FEEDING SUPPLEMENT, JEVITY 1.5 CAL/FIBER,) LIQD Place 237 mLs into feeding tube 4 (four) times daily. 1 can at 0700, 1100, 1500, and 2000.   Yes Historical Provider, MD  polyethylene glycol powder (MIRALAX) powder Take 17 g by mouth daily as needed for moderate constipation.    Yes Historical Provider, MD  primidone (MYSOLINE) 50 MG tablet Take 4 tablets at 0700, 1500, and 2000.   Yes Historical Provider, MD  PULMICORT 0.25 MG/2ML nebulizer solution USE 1 VIAL PER NEBULIZER TWICE DAILY.   Yes [name], MD  ranitidine (ZANTAC) 150 MG/10ML syrup Place 20 mLs (300 mg total) into feeding tube daily. 03/10/14  Yes Lafayette Dragon, MD  [name] (RISPERDAL) 0.5  MG tablet Take 1 tablet (0.5mg ) at 1100. Take 2 tablets (1mg ) at 1900.   Yes Historical Provider, MD  tiZANidine (ZANAFLEX) 2 MG tablet Take 2 mg by mouth once. Take 1 tablet at 0700.   Yes Historical Provider, MD  Simethicone (GAS-X INFANT DROPS) 40 MG/0.6ML LIQD Give 40 mg by tube 3 (three) times daily as needed (gas).     Historical Provider, MD    Allergies  Allergen Reactions  . Other     Paper and adhesive tape - blisters  . Penicillins   . Latex Rash and Other (See Comments)    blisters    Physical Exam  Vitals  Blood pressure 112/76, pulse 126, temperature 102.8 F (39.3 C), temperature source Rectal, resp. rate 17, height 4' (1.219 m), weight 25.855 kg (57 lb), SpO2 95.00%.   PHYSICAL EXAMINATION:  General - Thin, severely contracted, moves  in agitated fashion  HEENT -, nasal discharge, with oral secretions, nonpurulent. Cardiac - s1s2 regular, no murmur , tachycardic but regular Chest - Coarse Rhonchi bilaterally  Abdomen - soft, PEG tube in place , bowel sounds present Extremities - no edema, severely contracted , has erythema surrounding the buttocks thighs and hip area appears like cellulitis. Neurologic - awake, non-verbal, agitated    Data Review  CBC  Recent Labs Lab 04/26/14 1503  WBC 14.6*  HGB 15.4*  HCT 45.3  PLT 164  MCV 99.8  MCH 33.9  MCHC 34.0  RDW 13.5  LYMPHSABS 0.7  MONOABS 1.0  EOSABS 0.0  BASOSABS 0.0   ------------------------------------------------------------------------------------------------------------------  Chemistries   Recent Labs Lab 04/26/14 1503  NA 129*  K 3.5*  CL 88*  CO2 28  GLUCOSE 88  BUN 5*  CREATININE <0.20*  CALCIUM 8.4  AST 23  ALT 19  ALKPHOS 153*  BILITOT 0.4   ------------------------------------------------------------------------------------------------------------------ CrCl cannot be calculated (Patient has no sCr result on  file.). ------------------------------------------------------------------------------------------------------------------ No results found for this basename: TSH, T4TOTAL, FREET3, T3FREE, THYROIDAB,  in the last 72 hours   Coagulation profile No results found for this basename: INR, PROTIME,  in the last 168 hours ------------------------------------------------------------------------------------------------------------------- No results found for this basename: DDIMER,  in the last 72 hours -------------------------------------------------------------------------------------------------------------------  Cardiac Enzymes No results found for this basename: CK, CKMB, TROPONINI, MYOGLOBIN,  in the last 168 hours ------------------------------------------------------------------------------------------------------------------ No components found with this basename: POCBNP,    ---------------------------------------------------------------------------------------------------------------  Urinalysis    Component Value Date/Time   COLORURINE RED* 04/26/2014 1529   APPEARANCEUR TURBID* 04/26/2014 1529   LABSPEC 1.017 04/26/2014 1529   PHURINE 6.0 04/26/2014 1529   GLUCOSEU NEGATIVE 04/26/2014 1529   HGBUR LARGE* 04/26/2014 1529   BILIRUBINUR SMALL* 04/26/2014 [place] 04/26/2014 1529   PROTEINUR >300* 04/26/2014 1529   UROBILINOGEN 1.0 04/26/2014  [place] 04/26/2014 1529   LEUKOCYTESUR LARGE* 04/26/2014 1529    ----------------------------------------------------------------------------------------------------------------  Imaging results:   Dg Chest Portable 1 View  04/26/2014   CLINICAL DATA:  Fever, hematuria, vomiting.  EXAM: PORTABLE CHEST - 1 VIEW  COMPARISON:  Single view of the chest 02/04/2014.  FINDINGS: Lungs are clear. Heart size is normal. No pneumothorax or pleural effusion. Spinal stabilization hardware in ventriculostomy shunt catheter are  again seen. No acute abnormality.  IMPRESSION: No acute disease.   Electronically Signed   By: Inge Rise M.D.   On: 04/26/2014 15:45        Assessment & Plan  Principal Problem:   Sepsis Active Problems:   UTI (lower urinary tract infection)   Cellulitis of hip   Infantile cerebral palsy    Sepsis: -Patient presents with fever, tachycardia, tachypnea, leukocytosis, appears to be septic secondary to UTI, and cellulitis, patient was started on broad spectrum IV antibiotics including vancomycin, Azactam, levofloxacin, will admit to stay down, continue with aggressive fluid hydration, Tylenol as needed for fever, follow on blood cultures, urine cultures. -Check C. difficile as patient having diarrhea, check KUB. UTI -Will continue patient on Zosyn, follow on urine culture, and inserts for a catheter given the patient cellulitis and her incontinence.  Cellulitis of the hips and buttocks -Patient will be on IV vancomycin and Zosyn, will continue we'll follow blood cultures.  History of cerebral palsy, severe intellectual disability, and quadriplegia -We'll continue with supportive care, and antipsychotic medications, continue with feeding via PEG tube.  History of seizures -Continue with Pimidone.   DVT Prophylaxis Heparin   AM Labs Ordered, also please review Full Orders  Family Communication: Admission, patients condition and plan of care including tests being ordered have been discussed with the mother who indicate understanding and agree with the plan and Code Status.  Code Status DO NOT RESUSCITATE  Likely DC to  home in stable  Condition GUARDED  /critical  Time spent in minutes : 60 minutes    ELGERGAWY, DAWOOD M.D on 04/26/2014 at 6:03 PM  Between 7am to 7pm - Pager - [phone]  After 7pm go to [email or web address] - password [email or web address]  And look for the night coverage person covering me after hours  [facility] Office   [phone]   **Disclaimer: This note may have been dictated with voice recognition software. Similar sounding words can inadvertently be transcribed and this note may contain transcription errors which may not have been corrected upon publication of note.**

## 2014-04-26 NOTE — Progress Notes (Signed)
ANTIBIOTIC CONSULT NOTE - INITIAL  Pharmacy Consult for Vancomycin, Aztreonam, Levaquin Indication: rule out sepsis  Allergies  Allergen Reactions  . Other     Paper and adhesive tape - blisters  . Penicillins   . Latex Rash and Other (See Comments)    blisters   Patient Measurements: Height: 4' (121.9 cm) Weight: 57 lb (25.855 kg) IBW/kg (Calculated) : 17.9  Vital Signs: Temp: 102.8 F (39.3 C) (10/05 1440) Temp Source: Rectal (10/05 1440) BP: 113/89 mmHg (10/05 1432) Pulse Rate: 145 (10/05 1452) Intake/Output from previous day:   Intake/Output from this shift:    Labs:  Recent Labs  04/26/14 1503  WBC 14.6*  HGB 15.4*  PLT 164   Estimated Creatinine Clearance: 35.5 ml/min (by C-G formula based on Cr of 0.2). No results found for this basename: VANCOTROUGH, VANCOPEAK, VANCORANDOM, GENTTROUGH, GENTPEAK, GENTRANDOM, TOBRATROUGH, TOBRAPEAK, TOBRARND, AMIKACINPEAK, AMIKACINTROU, AMIKACIN,  in the last 72 hours   Microbiology: No results found for this or any previous visit (from the past 720 hour(s)).  Medical History: Past Medical History  Diagnosis Date  . Cerebral palsy   . Mental retardation   . Hydrocephalus   . Seizure disorder   . Osteopenia   . Hip dysplasia     bilateral  . DNR (do not resuscitate)   . DNI (do not intubate)   . Scoliosis   . Asthma     breathing treatment   Medications:  Scheduled:   Anti-infectives   Start     Dose/Rate Route Frequency Ordered Stop   04/26/14 1630  vancomycin (VANCOCIN) 500 mg in sodium chloride 0.9 % 100 mL IVPB     500 mg 100 mL/hr over 60 Minutes Intravenous  Once 04/26/14 1527     04/26/14 1515  levofloxacin (LEVAQUIN) IVPB 750 mg     750 mg 100 mL/hr over 90 Minutes Intravenous  Once 04/26/14 1503 04/26/14 1653   04/26/14 1515  aztreonam (AZACTAM) 2 g in dextrose 5 % 50 mL IVPB     2 g 100 mL/hr over 30 Minutes Intravenous  Once 04/26/14 1503 04/26/14 1724   04/26/14 1515  vancomycin (VANCOCIN) IVPB  1000 mg/200 mL premix  Status:  Discontinued     1,000 mg 200 mL/hr over 60 Minutes Intravenous  Once 04/26/14 1503 04/26/14 1522     Assessment: 14 yoF with Cerebral Palsy, to ED with rash, fever, hematuria x 2 days. Hx of urosepsis. Seen in PCP office today; febrile, hypoxic. Rule out sepsis  Vancomycin, Aztreonam, Levafloxacin per pharmacy. PCN allergy listed - reaction unknown  Total weight ~ 26kg, renal function difficult to assess, will obtain Vancomycin trough early  Goal of Therapy:  Vancomycin trough level 15-20 mcg/ml  Plan:   Vancomycin 500mg  IV q12  Aztreonam 2gm in ED, then 1gm q8h  Levaquin 750mg  in ED, then 500mg  q24 hr  Obtain Vancomycin trough soon  Minda Ditto PharmD Pager [phone] 04/26/2014, 6:16 PM

## 2014-04-27 LAB — CBC
HCT: 39.4 % (ref 36.0–46.0)
Hemoglobin: 13.2 g/dL (ref 12.0–15.0)
MCH: 33.7 pg (ref 26.0–34.0)
MCHC: 33.5 g/dL (ref 30.0–36.0)
MCV: 100.5 fL — AB (ref 78.0–100.0)
Platelets: 130 10*3/uL — ABNORMAL LOW (ref 150–400)
RBC: 3.92 MIL/uL (ref 3.87–5.11)
RDW: 13.6 % (ref 11.5–15.5)
WBC: 9.3 10*3/uL (ref 4.0–10.5)

## 2014-04-27 LAB — CLOSTRIDIUM DIFFICILE BY PCR: Toxigenic C. Difficile by PCR: NEGATIVE

## 2014-04-27 LAB — MAGNESIUM: Magnesium: 1.7 mg/dL (ref 1.5–2.5)

## 2014-04-27 LAB — BASIC METABOLIC PANEL
Anion gap: 9 (ref 5–15)
BUN: 3 mg/dL — AB (ref 6–23)
CO2: 25 meq/L (ref 19–32)
Calcium: 7.5 mg/dL — ABNORMAL LOW (ref 8.4–10.5)
Chloride: 105 mEq/L (ref 96–112)
Creatinine, Ser: <0.2 mg/dL — ABNORMAL LOW (ref 0.50–1.10)
Glucose, Bld: 107 mg/dL — ABNORMAL HIGH (ref 70–99)
POTASSIUM: 2.8 meq/L — AB (ref 3.7–5.3)
SODIUM: 139 meq/L (ref 137–147)

## 2014-04-27 LAB — GLUCOSE, CAPILLARY: GLUCOSE-CAPILLARY: 119 mg/dL — AB (ref 70–99)

## 2014-04-27 LAB — MRSA PCR SCREENING: MRSA by PCR: NEGATIVE

## 2014-04-27 MED ORDER — JEVITY 1.5 CAL/FIBER PO LIQD
237.0000 mL | Freq: Four times a day (QID) | ORAL | Status: DC
  Filled 2014-04-27 (×2): qty 1000

## 2014-04-27 MED ORDER — POTASSIUM CHLORIDE 20 MEQ/15ML (10%) PO LIQD
40.0000 meq | Freq: Once | ORAL | Status: AC
  Administered 2014-04-27: 40 meq
  Filled 2014-04-27: qty 30

## 2014-04-27 MED ORDER — POTASSIUM CHLORIDE 10 MEQ/100ML IV SOLN
10.0000 meq | INTRAVENOUS | Status: AC
  Administered 2014-04-27 (×4): 10 meq via INTRAVENOUS
  Filled 2014-04-27: qty 100

## 2014-04-27 MED ORDER — DEXTROSE 5 % IV SOLN
1.0000 g | Freq: Two times a day (BID) | INTRAVENOUS | Status: DC
  Administered 2014-04-27 – 2014-04-30 (×7): 1 g via INTRAVENOUS
  Filled 2014-04-27 (×7): qty 1

## 2014-04-27 MED ORDER — BUDESONIDE 0.25 MG/2ML IN SUSP
0.2500 mg | Freq: Two times a day (BID) | RESPIRATORY_TRACT | Status: DC
  Administered 2014-04-27 – 2014-04-30 (×6): 0.25 mg via RESPIRATORY_TRACT
  Filled 2014-04-27 (×6): qty 2

## 2014-04-27 MED ORDER — VITAL HIGH PROTEIN PO LIQD
1000.0000 mL | ORAL | Status: DC
Start: 2014-04-27 — End: 2014-04-27

## 2014-04-27 MED ORDER — POLYETHYLENE GLYCOL 3350 17 G PO PACK
17.0000 g | PACK | Freq: Every day | ORAL | Status: DC | PRN
  Administered 2014-04-27: 17 g via ORAL
  Filled 2014-04-27: qty 1

## 2014-04-27 MED ORDER — POTASSIUM CHLORIDE 20 MEQ/15ML (10%) PO LIQD
20.0000 meq | Freq: Once | ORAL | Status: AC
  Administered 2014-04-27: 20 meq via ORAL
  Filled 2014-04-27 (×3): qty 15

## 2014-04-27 MED ORDER — JEVITY 1.5 CAL/FIBER PO LIQD
237.0000 mL | Freq: Four times a day (QID) | ORAL | Status: DC
  Administered 2014-04-27: 237 mL
  Filled 2014-04-27 (×3): qty 1000

## 2014-04-27 MED ORDER — JEVITY 1.5 CAL/FIBER PO LIQD
180.0000 mL | Freq: Four times a day (QID) | ORAL | Status: DC
  Administered 2014-04-27 – 2014-04-30 (×11): 180 mL
  Filled 2014-04-27 (×14): qty 1000

## 2014-04-27 MED ORDER — MAGNESIUM SULFATE 40 MG/ML IJ SOLN
2.0000 g | Freq: Once | INTRAMUSCULAR | Status: AC
  Administered 2014-04-27: 2 g via INTRAVENOUS
  Filled 2014-04-27: qty 50

## 2014-04-27 MED ORDER — LEVOFLOXACIN IN D5W 250 MG/50ML IV SOLN: 250.0000 mg | INTRAVENOUS | Status: DC

## 2014-04-27 MED ORDER — BUDESONIDE 0.25 MG/2ML IN SUSP
RESPIRATORY_TRACT | Status: AC
  Filled 2014-04-27: qty 2

## 2014-04-27 NOTE — Progress Notes (Signed)
Patient Demographics  Renee [name], is a 28 y.o. female, DOB - 1986/03/25, BSW:[id number]  Admit date - 04/26/2014   Admitting Physician Phillips Climes, MD  Outpatient Primary MD for the patient is Purvis Kilts, MD  LOS - 1   Chief Complaint  Patient presents with  . Fever  . Rash  . Hematuria      Brief narrative: Renee [name] is a 28 y.o. female, with known history of cerebral palsy, quadriplegia, severely contracted, with severe intellectual disability , who was brought by her mother secondary to fever of 100.4 at home, and 103 at 33 office,, hematuria/dark-colored urine, and lethargy, as well mother reports patient has been having diarrhea, over the last 2 days, patient's symptoms started on Saturday 10/2, NAD patient was tachycardic in the 130s, patient urin analysis was positive for UTI, chest x-ray did not show any opacity or infiltrate, as well she was noticed to have hip and buttocks cellulitis on physical exam, patient was started on broad-spectrum IV antibiotics, after having blood cultures were sent. Patient has been admitted to stay down for sepsis, secondary to UTI and had/buttocks cellulitis, started initially on IV vancomycin, Azactam, levofloxacin,10/5 , switched to IV vancomycin and cefepime on 10/6 , still having low-grade temperature, tachycardia.  Subjective:   Renee [name] is nonverbal, but does persist off, and no further diarrhea overnight, still having low grade temperature overnight and in a.m..  Assessment & Plan    Principal Problem:   Sepsis Active Problems:   UTI (lower urinary tract infection)   Cellulitis of hip   Infantile cerebral palsy  Sepsis:  -Still have low-grade fever, and mild tachycardia,  -Secondary to UTI, bilateral hip and buttocks cellulitis, C. difficile is negative, urine culture and blood culture no  growth so far. UTI  -To change the IV as I can levofloxacin to cefepime on 10/6, inserted a Foley catheter given the patient cellulitis and her incontinence.  Cellulitis of the hips and buttocks  -Patient will be on IV vancomycin and cefepime, will continue we'll follow blood cultures.  History of cerebral palsy, severe intellectual disability, and quadriplegia  -We'll continue with supportive care, and antipsychotic medications, continue with feeding via PEG tube.  History of seizures  -Continue with Pimidone. Hypokalemia -Replace, recheck in a.m..  Code Status: DNR/DNI  Family Communication: Discussed with mother on 10/5, at bedside 10/6  Disposition Plan: Home when stable, will downgrade to telemetry for once tachycardia improves.   Procedures  None   Consults   None   Medications  Scheduled Meds: . aztreonam  1 g Intravenous Q8H  . baclofen  10 mg Per Tube Q24H   And  . baclofen  20 mg Per Tube 3 times per day  . budesonide      . budesonide  0.25 mg Nebulization Daily  . feeding supplement (JEVITY 1.5 CAL/FIBER)  237 mL Per Tube QID  . heparin  5,000 Units Subcutaneous 3 times per day  . levofloxacin (LEVAQUIN) IV  250 mg Intravenous Q24H  . primidone  200 mg Per Tube TID  . ranitidine  300 mg Per Tube Daily  . [name]  0.5 mg Per Tube Q24H  . [name]  1 mg Per Tube QPM  . sodium  chloride  3 mL Intravenous Q12H  . tiZANidine  2 mg Per Tube Daily  . vancomycin  500 mg Intravenous Q12H   Continuous Infusions: . sodium chloride 1,000 mL (04/26/14 1921)   PRN Meds:.acetaminophen (TYLENOL) oral liquid 160 mg/5 mL, acetaminophen, acetylcysteine, albuterol, ondansetron (ZOFRAN) IV, ondansetron, simethicone  DVT Prophylaxis   Heparin   Lab Results  Component Value Date   PLT 130* 04/27/2014    Antibiotics    Anti-infectives   Start     Dose/Rate Route Frequency Ordered Stop   04/27/14 2000  levofloxacin (LEVAQUIN) IVPB 500 mg  Status:  Discontinued      500 mg 100 mL/hr over 60 Minutes Intravenous Every 24 hours 04/26/14 1815 04/27/14 0823   04/27/14 2000  Levofloxacin (LEVAQUIN) IVPB 250 mg     250 mg 50 mL/hr over 60 Minutes Intravenous Every 24 hours 04/27/14 0823     04/27/14 0600  vancomycin (VANCOCIN) 500 mg in sodium chloride 0.9 % 100 mL IVPB     500 mg 100 mL/hr over 60 Minutes Intravenous Every 12 hours 04/26/14 1803     04/27/14 0500  aztreonam (AZACTAM) 1 g in dextrose 5 % 50 mL IVPB     1 g 100 mL/hr over 30 Minutes Intravenous Every 8 hours 04/26/14 1814     04/26/14 1630  vancomycin (VANCOCIN) 500 mg in sodium chloride 0.9 % 100 mL IVPB     500 mg 100 mL/hr over 60 Minutes Intravenous  Once 04/26/14 1527 04/26/14 1855   04/26/14 1515  levofloxacin (LEVAQUIN) IVPB 750 mg     750 mg 100 mL/hr over 90 Minutes Intravenous  Once 04/26/14 1503 04/26/14 1653   04/26/14 1515  aztreonam (AZACTAM) 2 g in dextrose 5 % 50 mL IVPB     2 g 100 mL/hr over 30 Minutes Intravenous  Once 04/26/14 1503 04/26/14 1724   04/26/14 1515  vancomycin (VANCOCIN) IVPB 1000 mg/200 mL premix  Status:  Discontinued     1,000 mg 200 mL/hr over 60 Minutes Intravenous  Once 04/26/14 1503 04/26/14 1522          Objective:   Filed Vitals:   04/27/14 0800 04/27/14 0900 04/27/14 1000 04/27/14 1025  BP: 108/54 94/55 101/56   Pulse: 109 74 116   Temp: 99.5 F (37.5 C) 99.5 F (37.5 C) 100 F (37.8 C)   TempSrc: Core (Comment)     Resp: 17 14 17    Height:      Weight:      SpO2: 95% 84% 93% 91%    Wt Readings from Last 3 Encounters:  04/26/14 27.7 kg (61 lb 1.1 oz)  12/28/13 28.1 kg (61 lb 15.2 oz)  11/24/13 27.216 kg (60 lb)     Intake/Output Summary (Last 24 hours) at 04/27/14 1110 Last data filed at 04/27/14 1000  Gross per 24 hour  Intake   4965 ml  Output   1225 ml  Net   3740 ml     Physical Exam  General - Thin, severely contracted, calm and quiet HEENT -, nasal discharge, with oral secretions, nonpurulent.  Cardiac  - s1s2 regular, no murmur , tachycardic but regular  Chest - Coarse Rhonchi bilaterally  Abdomen - soft, PEG tube in place , bowel sounds present  Extremities - no edema, severely contracted , has erythema surrounding the buttocks thighs and hip area appears like cellulitis.  Neurologic - awake, non-verbal, agitated     Data Review   Micro Results Recent  Results (from the past 240 hour(s))  CULTURE, BLOOD (ROUTINE X 2)     Status: None   Collection Time    04/26/14  3:04 PM      Result Value Ref Range Status   Specimen Description BLOOD RIGHT ARM   Final   Special Requests BOTTLES DRAWN AEROBIC AND ANAEROBIC 5ML   Final   Culture  Setup Time     Final   Value: 04/26/2014 23:03     Performed at [facility]   Culture     Final   Value:        BLOOD CULTURE RECEIVED NO GROWTH TO DATE CULTURE WILL BE HELD FOR 5 DAYS BEFORE ISSUING A FINAL NEGATIVE REPORT     Performed at [facility]   Report Status PENDING   Incomplete  CULTURE, BLOOD (ROUTINE X 2)     Status: None   Collection Time    04/26/14  3:18 PM      Result Value Ref Range Status   Specimen Description BLOOD LEFT HAND   Final   Special Requests BOTTLES DRAWN AEROBIC ONLY 2ML   Final   Culture  Setup Time     Final   Value: 04/26/2014 22:55     Performed at [facility]   Culture     Final   Value:        BLOOD CULTURE RECEIVED NO GROWTH TO DATE CULTURE WILL BE HELD FOR 5 DAYS BEFORE ISSUING A FINAL NEGATIVE REPORT     Performed at [facility]   Report Status PENDING   Incomplete  MRSA PCR SCREENING     Status: None   Collection Time    04/26/14  9:00 PM      Result Value Ref Range Status   MRSA by PCR NEGATIVE  NEGATIVE Final   Comment:            The GeneXpert MRSA Assay (FDA     approved for NASAL specimens     only), is one component of a     comprehensive MRSA colonization     surveillance program. It is not     intended to diagnose MRSA     infection nor to guide or      monitor treatment for     MRSA infections.    Radiology Reports Abd 1 View (kub)  04/26/2014   CLINICAL DATA:  Abdominal pain.  History of cervical palsy.  EXAM: ABDOMEN - 1 VIEW  COMPARISON:  Abdominal CT 08/02/2010.  FINDINGS: Generalized colonic distention is similar to priors. There is no evidence of bowel wall thickening, pneumatosis or free intraperitoneal air. Harrington rods, ventriculoperitoneal shunt, scoliosis and findings of bilateral congenital hip dysplasia are noted.  IMPRESSION: No definite acute findings. Colonic distention is similar to priors.   Electronically Signed   By: Camie Patience M.D.   On: 04/26/2014 21:11   Dg Chest Portable 1 View  04/26/2014   CLINICAL DATA:  Fever, hematuria, vomiting.  EXAM: PORTABLE CHEST - 1 VIEW  COMPARISON:  Single view of the chest 02/04/2014.  FINDINGS: Lungs are clear. Heart size is normal. No pneumothorax or pleural effusion. Spinal stabilization hardware in ventriculostomy shunt catheter are again seen. No acute abnormality.  IMPRESSION: No acute disease.   Electronically Signed   By: Inge Rise M.D.   On: 04/26/2014 15:45    CBC  Recent Labs Lab 04/26/14 1503 04/27/14 0345  WBC 14.6* 9.3  HGB 15.4*  13.2  HCT 45.3 39.4  PLT 164 130*  MCV 99.8 100.5*  MCH 33.9 33.7  MCHC 34.0 33.5  RDW 13.5 13.6  LYMPHSABS 0.7  --   MONOABS 1.0  --   EOSABS 0.0  --   BASOSABS 0.0  --     Chemistries   Recent Labs Lab 04/26/14 1503 04/27/14 0345  NA 129* 139  K 3.5* 2.8*  CL 88* 105  CO2 28 25  GLUCOSE 88 107*  BUN 5* 3*  CREATININE <0.20* <0.20*  CALCIUM 8.4 7.5*  MG  --  1.7  AST 23  --   ALT 19  --   ALKPHOS 153*  --   BILITOT 0.4  --    ------------------------------------------------------------------------------------------------------------------ CrCl cannot be calculated (Patient has no sCr result on  file.). ------------------------------------------------------------------------------------------------------------------ No results found for this basename: HGBA1C,  in the last 72 hours ------------------------------------------------------------------------------------------------------------------ No results found for this basename: CHOL, HDL, LDLCALC, TRIG, CHOLHDL, LDLDIRECT,  in the last 72 hours ------------------------------------------------------------------------------------------------------------------ No results found for this basename: TSH, T4TOTAL, FREET3, T3FREE, THYROIDAB,  in the last 72 hours ------------------------------------------------------------------------------------------------------------------ No results found for this basename: VITAMINB12, FOLATE, FERRITIN, TIBC, IRON, RETICCTPCT,  in the last 72 hours  Coagulation profile No results found for this basename: INR, PROTIME,  in the last 168 hours  No results found for this basename: DDIMER,  in the last 72 hours  Cardiac Enzymes No results found for this basename: CK, CKMB, TROPONINI, MYOGLOBIN,  in the last 168 hours ------------------------------------------------------------------------------------------------------------------ No components found with this basename: POCBNP,      Time Spent in minutes   30 minutes   ELGERGAWY, DAWOOD M.D on 04/27/2014 at 11:10 AM  Between 7am to 7pm - Pager - [phone]  After 7pm go to [email or web address] - password [email or web address]  And look for the night coverage person covering for me after hours  [facility] Office  [phone]   **Disclaimer: This note may have been dictated with voice recognition software. Similar sounding words can inadvertently be transcribed and this note may contain transcription errors which may not have been corrected upon publication of note.**

## 2014-04-27 NOTE — Progress Notes (Addendum)
Pharmacy - Brief note (antibiotics)  Pharmacy Consult for  Levaquin, aztreonam, vancomycin Indication: rule out sepsis (UTI/Cellulitis)  47 yoF with Cerebral Palsy, to ED with rash, fever, hematuria, and diarrhea x 2 days. Hx of urosepsis. Seen in PCP office today; febrile, hypoxic. Rule out sepsis Vancomycin, Aztreonam, Levafloxacin per pharmacy. PCN allergy listed - reaction unknown Total weight ~ 26kg, renal function difficult to assess, will obtain Vancomycin trough early  10/5 >> Vanc >> 10/5 >> Aztreonam >>  10/5 >> Levaquin >>  Tmax: 102.8 WBCs: 14.6K Renal:Cr < 0.2 (cerebral palsy, quadriplegia), normalized CrCl (using SCr of 0.8) > 184ml/min, C-G (using SCr = 0.8) = 34ml/min   10/5 blood: NGTD 10/5 urine: (UA = many epi, + pyuria) 10/5 CDiff:     A/P:  Based on low-body weight, will reduce levofloxacin to 250mg  IV q24h (based on weight-based dosing using in peds 5-16 yrs old)  Check vancomycin trough in am to evaluate appropriateness of dose  Aztreonam dose appears appropriate (using weight-based dosing)  Doreene Eland, PharmD, BCPS.   Pager: [phone] 04/27/2014 8:29 AM  ADDENDUM:  Orders to modify antibiotics to vancomycin/cefepime.  Spoke to [facility] physician and PCN allergy clarified as rash.  Advanced generation c-sporin appropriate d/t low risk of cross-sensitivity.   A/P:  Start cefepime 1gm IV q12h based on weight and estimate renal fx  Pediatric dosing (mg/kg) used as guide  Doreene Eland, PharmD, BCPS.   Pager: [phone] 04/27/2014 11:21 AM

## 2014-04-27 NOTE — Plan of Care (Signed)
Problem: Phase I Progression Outcomes Goal: Voiding-avoid urinary catheter unless indicated Outcome: Progressing Pt is chronic self cath as out patient, due to fever and possible sepsis a temp foley was placed.

## 2014-04-27 NOTE — Progress Notes (Addendum)
INITIAL NUTRITION ASSESSMENT  DOCUMENTATION CODES Per approved criteria  -Not Applicable   INTERVENTION: - Continue home TF regimen via PEG of 145m of Jevity 1.5 4 times/day which provides 1065 calories, 45g protein, 5431mfree water which meets 100% of estimated nutritional needs  - Start enteral protocol - RD to continue to monitor   NUTRITION DIAGNOSIS: Inadequate oral intake related to inability to eat as evidenced by NPO.   Goal: TF to meet >90% of estimated needs  Monitor:  Weights, labs, TF tolerance  Reason for Assessment: Low braden, consult for TF  [age].o. female  Admitting Dx: Sepsis  ASSESSMENT: Pt with pertinent PMH of CP, quadriplegia, severely contracted, hydrocephalus, MR presents with rash, fever, diarrhea, hematuria x 2 days. Pt has a h/o urosepsis. She was seen yesterday by PCP, was tachycardic, febrile, and hypoxic. Found to have sepsis and urinary tract infection.  - Aunt in room, who cares for pt during the day, reports pt does not eat by mouth, gets 18010mf Jevity 1.5 4 times/day which provides 1065 calories, 45g protein, 540m58mee water. Gets 60ml3me water flushes with each can for a total of 78ml 75mr/day.  - Aunt reports pt may get an additional 180ml o51mvity 1.5 if pt is not too full from 7pm can - She also states pt usually gets 8oz of juice around 3pm if she is having diarrhea or mucus production - Reports pt has been gaining weight and was seen by a GI who didn't want pt to gain any more weight  - Said pt used to reflux and aspiration but was started on a medication that helped this - Aunt denies any problems with TF or intolerance at home  Alk phos elevated, getting IV and oral replacement Potassium low    Height: Ht Readings from Last 1 Encounters:  04/26/14 4' (1.219 m)    Weight: Wt Readings from Last 1 Encounters:  04/26/14 61 lb 1.1 oz (27.7 kg)    Ideal Body Weight: 68-72 lbs (adjusted for quadriplegia)  % Ideal Body  Weight: 85-90%  Wt Readings from Last 10 Encounters:  04/26/14 61 lb 1.1 oz (27.7 kg)  12/28/13 61 lb 15.2 oz (28.1 kg)  11/24/13 60 lb (27.216 kg)  09/22/13 55 lb (24.948 kg)  06/03/13 48 lb (21.773 kg)  05/01/13 48 lb (21.773 kg)  10/17/12 48 lb (21.773 kg)  04/14/12 48 lb (21.773 kg)  10/17/11 50 lb (22.68 kg)  04/17/11 52 lb (23.587 kg)    Usual Body Weight: 61 lbs   % Usual Body Weight: 100%  BMI:  Body mass index is 18.64 kg/(m^2).  Estimated Nutritional Needs: Kcal: 1000-1200 Protein: 45-60g Fluid: 1-1.2L/day   Skin: intact   Diet Order: NPO  EDUCATION NEEDS: -No education needs identified at this time   Intake/Output Summary (Last 24 hours) at 04/27/14 1123 Last data filed at 04/27/14 1000  Gross per 24 hour  Intake   4965 ml  Output   1225 ml  Net   3740 ml    Last BM: 10/5  Labs:   Recent Labs Lab 04/26/14 1503 04/27/14 0345  NA 129* 139  K 3.5* 2.8*  CL 88* 105  CO2 28 25  BUN 5* 3*  CREATININE <0.20* <0.20*  CALCIUM 8.4 7.5*  MG  --  1.7  GLUCOSE 88 107*    CBG (last 3)  No results found for this basename: GLUCAP,  in the last 72 hours  Scheduled Meds: . aztreonam  1 g Intravenous Q8H  . baclofen  10 mg Per Tube Q24H   And  . baclofen  20 mg Per Tube 3 times per day  . budesonide      . budesonide  0.25 mg Nebulization Daily  . feeding supplement (JEVITY 1.5 CAL/FIBER)  237 mL Per Tube QID  . heparin  5,000 Units Subcutaneous 3 times per day  . levofloxacin (LEVAQUIN) IV  250 mg Intravenous Q24H  . primidone  200 mg Per Tube TID  . ranitidine  300 mg Per Tube Daily  . [name]  0.5 mg Per Tube Q24H  . [name]  1 mg Per Tube QPM  . sodium chloride  3 mL Intravenous Q12H  . tiZANidine  2 mg Per Tube Daily  . vancomycin  500 mg Intravenous Q12H    Continuous Infusions: . sodium chloride 1,000 mL (04/26/14 1921)    Past Medical History  Diagnosis Date  . Cerebral palsy   . Mental retardation   . Hydrocephalus    . Seizure disorder   . Osteopenia   . Hip dysplasia     bilateral  . DNR (do not resuscitate)   . DNI (do not intubate)   . Scoliosis   . Asthma     breathing treatment    Past Surgical History  Procedure Laterality Date  . Spinal fusion  2005  . 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 Laparoscopic nissen fundoplication    . Gastrostomy tube placement      at same time as nissen  . Tympanostomy tube placement    . Ileopsoas release Bilateral     Dr Susy Frizzle  . Tenotomy adductor / hamstring closed Bilateral 04/25/00    Carlis Stable MS, RD, LDN [phone] Pager [phone] Weekend/After Hours Pager

## 2014-04-28 ENCOUNTER — Encounter (HOSPITAL_COMMUNITY): Payer: Self-pay | Admitting: Internal Medicine

## 2014-04-28 DIAGNOSIS — Z66 Do not resuscitate: Secondary | ICD-10-CM

## 2014-04-28 DIAGNOSIS — E43 Unspecified severe protein-calorie malnutrition: Secondary | ICD-10-CM

## 2014-04-28 HISTORY — DX: Do not resuscitate: Z66

## 2014-04-28 LAB — URINE CULTURE: Colony Count: >=100000

## 2014-04-28 LAB — BASIC METABOLIC PANEL
Anion gap: 8 (ref 5–15)
BUN: 3 mg/dL — ABNORMAL LOW (ref 6–23)
CALCIUM: 8.2 mg/dL — AB (ref 8.4–10.5)
CO2: 28 mEq/L (ref 19–32)
Chloride: 100 mEq/L (ref 96–112)
GLUCOSE: 77 mg/dL (ref 70–99)
Potassium: 4.4 mEq/L (ref 3.7–5.3)
SODIUM: 136 meq/L — AB (ref 137–147)

## 2014-04-28 LAB — GLUCOSE, CAPILLARY
GLUCOSE-CAPILLARY: 69 mg/dL — AB (ref 70–99)
GLUCOSE-CAPILLARY: 102 mg/dL — AB (ref 70–99)
Glucose-Capillary: 86 mg/dL (ref 70–99)
Glucose-Capillary: 121 mg/dL — ABNORMAL HIGH (ref 70–99)

## 2014-04-28 LAB — CBC
HCT: 40.6 % (ref 36.0–46.0)
Hemoglobin: 13.6 g/dL (ref 12.0–15.0)
MCH: 33.3 pg (ref 26.0–34.0)
MCHC: 33.5 g/dL (ref 30.0–36.0)
MCV: 99.5 fL (ref 78.0–100.0)
Platelets: 177 10*3/uL (ref 150–400)
RBC: 4.08 MIL/uL (ref 3.87–5.11)
RDW: 13.8 % (ref 11.5–15.5)
WBC: 7.6 10*3/uL (ref 4.0–10.5)

## 2014-04-28 LAB — VANCOMYCIN, TROUGH

## 2014-04-28 LAB — MAGNESIUM: Magnesium: 2 mg/dL (ref 1.5–2.5)

## 2014-04-28 MED ORDER — VANCOMYCIN HCL 500 MG IV SOLR
500.0000 mg | Freq: Three times a day (TID) | INTRAVENOUS | Status: DC
  Administered 2014-04-28 – 2014-04-29 (×3): 500 mg via INTRAVENOUS
  Filled 2014-04-28 (×4): qty 500

## 2014-04-28 MED ORDER — TIZANIDINE HCL 2 MG PO TABS
2.0000 mg | ORAL_TABLET | Freq: Every day | ORAL | Status: DC
Start: 2014-04-28 — End: 2014-04-28
  Filled 2014-04-28: qty 1

## 2014-04-28 MED ORDER — TIZANIDINE HCL 2 MG PO TABS
2.0000 mg | ORAL_TABLET | ORAL | Status: DC
  Administered 2014-04-28 – 2014-04-29 (×2): 2 mg
  Filled 2014-04-28 (×3): qty 1

## 2014-04-28 MED ORDER — HEPARIN SODIUM (PORCINE) 5000 UNIT/ML IJ SOLN
5000.0000 [IU] | Freq: Two times a day (BID) | INTRAMUSCULAR | Status: DC
  Administered 2014-04-28 – 2014-04-29 (×3): 5000 [IU] via SUBCUTANEOUS
  Filled 2014-04-28 (×3): qty 1

## 2014-04-28 MED ORDER — INFLUENZA VAC SPLIT QUAD 0.5 ML IM SUSY
0.5000 mL | PREFILLED_SYRINGE | INTRAMUSCULAR | Status: AC
  Administered 2014-04-30: 0.5 mL via INTRAMUSCULAR
  Filled 2014-04-28 (×3): qty 0.5

## 2014-04-28 NOTE — Progress Notes (Signed)
Pt was real restless and agitated tonight. Her mother commented that normally if she couldn't sleep she would not be this loud or agitated. Fever max 100.4 (Core Temp) Tylenol liquid given but this is not help with agitation. Pt was sleep around 3 am. Mother was very concerned as this behavior is not normal .

## 2014-04-28 NOTE — Progress Notes (Signed)
[facility] PROGRESS NOTE  Renee [name] UDJ:[id number] DOB: 02-10-1986 DOA: 04/26/2014 PCP: Purvis Kilts, MD  Brief narrative:  Renee [name] is a 28 y.o. female, with known history of cerebral palsy, quadriplegia, severely contracted, with severe intellectual disability , who was brought by her mother secondary to fever of 100.4 at home, and 103 at 80 office,, hematuria/dark-colored urine, and lethargy, as well mother reports patient has been having diarrhea, over the last 2 days, patient's symptoms started on Saturday 10/2, NAD patient was tachycardic in the 130s, patient urin analysis was positive for UTI, chest x-ray did not show any opacity or infiltrate, as well she was noticed to have hip and buttocks cellulitis on physical exam, patient was started on broad-spectrum IV antibiotics, after having blood cultures were sent.  Patient has been admitted to stay down for sepsis, secondary to UTI and hip/buttocks cellulitis, started initially on IV vancomycin, Azactam, levofloxacin,10/5 , switched to IV vancomycin and cefepime on 10/6 , still having low-grade temperature, tachycardia slowly improving,.       Assessment/Plan: #1 sepsis Secondary to urinary tract infection and hip cellulitis. Patient with clinical improvement in part has improved. Fever curve is trending down. Tachycardia is improving. Urine cultures with group B strep agalactaie. C. difficile was negative. Blood cultures were no growth to date and pending. WBC is trending down. Clinical improvement. Continue empiric IV vancomycin and IV cefepime. Follow.  #2 UTI Urine cultures growing group B strep agalactaie. Patient on IV cefepime. Continue Foley catheter. Follow.  #3 cellulitis of the hips and buttocks Blood cultures are pending. Clinical improvement. Continue empiric IV vancomycin IV cefepime. Follow.  #4 history of seizures Stable. No seizures noted. Continue primidone.  #5 history of cerebral  palsy/severe intellectual disability/quadriplegia Continue current antipsychotic medications via PEG tube. Continue supportive care.  #6 severe protein malnutrition Continue Jevity.  #7 hypokalemia Repleted. Magnesium level of 2.  #8 prophylaxis Continue Zantac for PPI prophylaxis. Heparin for DVT prophylaxis.  Code Status: DO NOT RESUSCITATE Family Communication: Updated aunt at bedside. Disposition Plan: Remain the step down   Consultants:  None  Procedures:  Chest x-ray 04/26/2014  Abdominal x-ray 04/26/2014  Antibiotics:  IV cefepime 04/27/2014  IV vancomycin 04/26/2014  IV Levaquin 04/26/2014>>>> 04/27/2014  HPI/Subjective: Patient alert. Patient nonverbal. Patient tracking. Patient smiling with hands in her mouth.  Objective: Filed Vitals:   04/28/14 0900  BP: 112/67  Pulse: 108  Temp: 99.5 F (37.5 C)  Resp: 21    Intake/Output Summary (Last 24 hours) at 04/28/14 1019 Last data filed at 04/28/14 0941  Gross per 24 hour  Intake 2567.92 ml  Output   3350 ml  Net -782.08 ml   Filed Weights   04/26/14 1507 04/26/14 1921 04/28/14 0700  Weight: 25.855 kg (57 lb) 27.7 kg (61 lb 1.1 oz) 28.8 kg (63 lb 7.9 oz)    Exam:   General:  NAD  Cardiovascular: RRR  Respiratory: CTAB anterior lung fields  Abdomen: Soft, nontender, nondistended, positive bowel sounds.  Musculoskeletal: Extremities in contractures. Decreased erythema around hips, nontender to palpation him a decreased warmth.  Data Reviewed: Basic Metabolic Panel:  Recent Labs Lab 04/26/14 1503 04/27/14 0345 04/28/14 0600 04/28/14 0605  NA 129* 139  --  136*  K 3.5* 2.8*  --  4.4  CL 88* 105  --  100  CO2 28 25  --  28  GLUCOSE 88 107*  --  77  BUN 5* 3*  --  3*  CREATININE <  0.20* <0.20*  --  <0.20*  CALCIUM 8.4 7.5*  --  8.2*  MG  --  1.7 2.0  --    Liver Function Tests:  Recent Labs Lab 04/26/14 1503  AST 23  ALT 19  ALKPHOS 153*  BILITOT 0.4  PROT 7.2  ALBUMIN  3.1*   No results found for this basename: LIPASE, AMYLASE,  in the last 168 hours No results found for this basename: AMMONIA,  in the last 168 hours CBC:  Recent Labs Lab 04/26/14 1503 04/27/14 0345 04/28/14 0605  WBC 14.6* 9.3 7.6  NEUTROABS 12.9*  --   --   HGB 15.4* 13.2 13.6  HCT 45.3 39.4 40.6  MCV 99.8 100.5* 99.5  PLT 164 130* 177   Cardiac Enzymes: No results found for this basename: CKTOTAL, CKMB, CKMBINDEX, TROPONINI,  in the last 168 hours BNP (last 3 results) No results found for this basename: PROBNP,  in the last 8760 hours CBG:  Recent Labs Lab 04/27/14 1641 04/28/14 0850  GLUCAP 119* 102*    Recent Results (from the past 240 hour(s))  CULTURE, BLOOD (ROUTINE X 2)     Status: None   Collection Time    04/26/14  3:04 PM      Result Value Ref Range Status   Specimen Description BLOOD RIGHT ARM   Final   Special Requests BOTTLES DRAWN AEROBIC AND ANAEROBIC 5ML   Final   Culture  Setup Time     Final   Value: 04/26/2014 23:03     Performed at [facility]   Culture     Final   Value:        BLOOD CULTURE RECEIVED NO GROWTH TO DATE CULTURE WILL BE HELD FOR 5 DAYS BEFORE ISSUING A FINAL NEGATIVE REPORT     Performed at [facility]   Report Status PENDING   Incomplete  CULTURE, BLOOD (ROUTINE X 2)     Status: None   Collection Time    04/26/14  3:18 PM      Result Value Ref Range Status   Specimen Description BLOOD LEFT HAND   Final   Special Requests BOTTLES DRAWN AEROBIC ONLY 2ML   Final   Culture  Setup Time     Final   Value: 04/26/2014 22:55     Performed at [facility]   Culture     Final   Value:        BLOOD CULTURE RECEIVED NO GROWTH TO DATE CULTURE WILL BE HELD FOR 5 DAYS BEFORE ISSUING A FINAL NEGATIVE REPORT     Performed at [facility]   Report Status PENDING   Incomplete  URINE CULTURE     Status: None   Collection Time    04/26/14  3:29 PM      Result Value Ref Range Status   Specimen Description  URINE, RANDOM   Final   Special Requests NONE   Final   Culture  Setup Time     Final   Value: 04/26/2014 21:50     Performed at [place] [facility]     Final   Value: >=100,000 COLONIES/ML     Performed at [facility]   Culture     Final   Value: GROUP B STREP(S.AGALACTIAE)ISOLATED     Note: TESTING AGAINST S. AGALACTIAE NOT ROUTINELY PERFORMED DUE TO PREDICTABILITY OF AMP/PEN/VAN SUSCEPTIBILITY.     Performed at [facility]   Report Status  04/28/2014 FINAL   Final  CLOSTRIDIUM DIFFICILE BY PCR     Status: None   Collection Time    04/26/14  5:58 PM      Result Value Ref Range Status   C difficile by pcr NEGATIVE  NEGATIVE Final   Comment: Performed at [facility]  MRSA PCR SCREENING     Status: None   Collection Time    04/26/14  9:00 PM      Result Value Ref Range Status   MRSA by PCR NEGATIVE  NEGATIVE Final   Comment:            The GeneXpert MRSA Assay (FDA     approved for NASAL specimens     only), is one component of a     comprehensive MRSA colonization     surveillance program. It is not     intended to diagnose MRSA     infection nor to guide or     monitor treatment for     MRSA infections.     Studies: Abd 1 View (kub)  04/26/2014   CLINICAL DATA:  Abdominal pain.  History of cervical palsy.  EXAM: ABDOMEN - 1 VIEW  COMPARISON:  Abdominal CT 08/02/2010.  FINDINGS: Generalized colonic distention is similar to priors. There is no evidence of bowel wall thickening, pneumatosis or free intraperitoneal air. Harrington rods, ventriculoperitoneal shunt, scoliosis and findings of bilateral congenital hip dysplasia are noted.  IMPRESSION: No definite acute findings. Colonic distention is similar to priors.   Electronically Signed   By: Camie Patience M.D.   On: 04/26/2014 21:11   Dg Chest Portable 1 View  04/26/2014   CLINICAL DATA:  Fever, hematuria, vomiting.  EXAM: PORTABLE CHEST - 1 VIEW  COMPARISON:  Single view of the chest  02/04/2014.  FINDINGS: Lungs are clear. Heart size is normal. No pneumothorax or pleural effusion. Spinal stabilization hardware in ventriculostomy shunt catheter are again seen. No acute abnormality.  IMPRESSION: No acute disease.   Electronically Signed   By: Inge Rise M.D.   On: 04/26/2014 15:45    Scheduled Meds: . baclofen  10 mg Per Tube Q24H   And  . baclofen  20 mg Per Tube 3 times per day  . budesonide  0.25 mg Nebulization BID  . ceFEPime (MAXIPIME) IV  1 g Intravenous BID  . feeding supplement (JEVITY 1.5 CAL/FIBER)  180 mL Per Tube QID  . heparin  5,000 Units Subcutaneous 3 times per day  . [START ON 04/29/2014] Influenza vac split quadrivalent PF  0.5 mL Intramuscular Tomorrow-1000  . primidone  200 mg Per Tube TID  . ranitidine  300 mg Per Tube Daily  . [name]  0.5 mg Per Tube Q24H  . [name]  1 mg Per Tube QPM  . sodium chloride  3 mL Intravenous Q12H  . tiZANidine  2 mg Per Tube Daily  . vancomycin  500 mg Intravenous Q12H   Continuous Infusions: . sodium chloride 75 mL/hr at 04/27/14 1143    Principal Problem:   Sepsis Active Problems:   Infantile cerebral palsy   Congenital quadriplegia   Protein-calorie malnutrition, severe   LPRD (laryngopharyngeal reflux disease)   UTI (lower urinary tract infection)   Cellulitis of hip   DNR (do not resuscitate)    Time spent: [facility] Pager [phone]. If 7PM-7AM, please contact night-coverage at [email or web address], password [facility] 04/28/2014, 10:19 AM  LOS: 2 days

## 2014-04-28 NOTE — Clinical Documentation Improvement (Signed)
.   Adult BMI (21 years +):  Current: 18.64 -- 01/04/14 Problem List documents Severe protein calorie malnutrition -- Ht: 4', Wt: 61 lbs -- % Ideal body weight: 85-90% -- Hx cerebral palsy, contracted, requiring PEG feeds   Thank Sheral Flow RN CDI [phone] HIM department

## 2014-04-28 NOTE — Progress Notes (Signed)
[place] for Vancomycin, cefepime Indication: rule out sepsis  Allergies  Allergen Reactions  . Other     Paper and adhesive tape - blisters  . Penicillins   . Latex Rash and Other (See Comments)    blisters   Patient Measurements: Height: 4' (121.9 cm) Weight: 63 lb 7.9 oz (28.8 kg) IBW/kg (Calculated) : 17.9  Vital Signs: Temp: 99.1 F (37.3 C) (10/07 1000) Temp Source: Core (Comment) (10/07 0800) BP: 111/60 mmHg (10/07 1000) Pulse Rate: 90 (10/07 1000) Intake/Output from previous day: 10/06 0701 - 10/07 0700 In: 2917.9 [I.V.:1917.9; IV Piggyback:550] Out: 3350 [Urine:3350] Intake/Output from this shift: Total I/O In: 375 [I.V.:225; Other:100; IV Piggyback:50] Out: -   Labs:  Recent Labs  04/26/14 1503 04/27/14 0345 04/28/14 0605  WBC 14.6* 9.3 7.6  HGB 15.4* 13.2 13.6  PLT 164 130* 177  CREATININE <0.20* <0.20* <0.20*   CrCl cannot be calculated (Patient has no sCr result on file.).  Recent Labs  04/28/14 0945  Oliver <5.0*     Microbiology: Recent Results (from the past 720 hour(s))  CULTURE, BLOOD (ROUTINE X 2)     Status: None   Collection Time    04/26/14  3:04 PM      Result Value Ref Range Status   Specimen Description BLOOD RIGHT ARM   Final   Special Requests BOTTLES DRAWN AEROBIC AND ANAEROBIC 5ML   Final   Culture  Setup Time     Final   Value: 04/26/2014 23:03     Performed at [facility]   Culture     Final   Value:        BLOOD CULTURE RECEIVED NO GROWTH TO DATE CULTURE WILL BE HELD FOR 5 DAYS BEFORE ISSUING A FINAL NEGATIVE REPORT     Performed at [facility]   Report Status PENDING   Incomplete  CULTURE, BLOOD (ROUTINE X 2)     Status: None   Collection Time    04/26/14  3:18 PM      Result Value Ref Range Status   Specimen Description BLOOD LEFT HAND   Final   Special Requests BOTTLES DRAWN AEROBIC ONLY 2ML   Final   Culture  Setup Time     Final   Value: 04/26/2014  22:55     Performed at [facility]   Culture     Final   Value:        BLOOD CULTURE RECEIVED NO GROWTH TO DATE CULTURE WILL BE HELD FOR 5 DAYS BEFORE ISSUING A FINAL NEGATIVE REPORT     Performed at [facility]   Report Status PENDING   Incomplete  URINE CULTURE     Status: None   Collection Time    04/26/14  3:29 PM      Result Value Ref Range Status   Specimen Description URINE, RANDOM   Final   Special Requests NONE   Final   Culture  Setup Time     Final   Value: 04/26/2014 21:50     Performed at [place]     Final   Value: >=100,000 COLONIES/ML     Performed at [facility]   Culture     Final   Value: GROUP B STREP(S.AGALACTIAE)ISOLATED     Note: TESTING AGAINST S. AGALACTIAE NOT ROUTINELY PERFORMED DUE TO PREDICTABILITY OF AMP/PEN/VAN SUSCEPTIBILITY.     Performed at [facility]  Report Status 04/28/2014 FINAL   Final  CLOSTRIDIUM DIFFICILE BY PCR     Status: None   Collection Time    04/26/14  5:58 PM      Result Value Ref Range Status   C difficile by pcr NEGATIVE  NEGATIVE Final   Comment: Performed at [facility]  MRSA PCR SCREENING     Status: None   Collection Time    04/26/14  9:00 PM      Result Value Ref Range Status   MRSA by PCR NEGATIVE  NEGATIVE Final   Comment:            The GeneXpert MRSA Assay (FDA     approved for NASAL specimens     only), is one component of a     comprehensive MRSA colonization     surveillance program. It is not     intended to diagnose MRSA     infection nor to guide or     monitor treatment for     MRSA infections.    Medical History: Past Medical History  Diagnosis Date  . Cerebral palsy   . Mental retardation   . Hydrocephalus   . Seizure disorder   . Osteopenia   . Hip dysplasia     bilateral  . DNR (do not resuscitate)   . DNI (do not intubate)   . Scoliosis   . Asthma     breathing treatment  . DNR (do not resuscitate) 04/28/2014    Medications:  Scheduled:  . baclofen  10 mg Per Tube Q24H   And  . baclofen  20 mg Per Tube 3 times per day  . budesonide  0.25 mg Nebulization BID  . ceFEPime (MAXIPIME) IV  1 g Intravenous BID  . feeding supplement (JEVITY 1.5 CAL/FIBER)  180 mL Per Tube QID  . heparin  5,000 Units Subcutaneous 3 times per day  . [START ON 04/29/2014] Influenza vac split quadrivalent PF  0.5 mL Intramuscular Tomorrow-1000  . primidone  200 mg Per Tube TID  . ranitidine  300 mg Per Tube Daily  . [name]  0.5 mg Per Tube Q24H  . [name]  1 mg Per Tube QPM  . sodium chloride  3 mL Intravenous Q12H  . tiZANidine  2 mg Per Tube Daily  . vancomycin  500 mg Intravenous Q12H   Anti-infectives   Start     Dose/Rate Route Frequency Ordered Stop   04/27/14 2000  levofloxacin (LEVAQUIN) IVPB 500 mg  Status:  Discontinued     500 mg 100 mL/hr over 60 Minutes Intravenous Every 24 hours 04/26/14 1815 04/27/14 0823   04/27/14 2000  Levofloxacin (LEVAQUIN) IVPB 250 mg  Status:  Discontinued     250 mg 50 mL/hr over 60 Minutes Intravenous Every 24 hours 04/27/14 0823 04/27/14 1127   04/27/14 1230  ceFEPIme (MAXIPIME) 1 g in dextrose 5 % 50 mL IVPB     1 g 100 mL/hr over 30 Minutes Intravenous 2 times daily 04/27/14 1124     04/27/14 0600  vancomycin (VANCOCIN) 500 mg in sodium chloride 0.9 % 100 mL IVPB     500 mg 100 mL/hr over 60 Minutes Intravenous Every 12 hours 04/26/14 1803     04/27/14 0500  aztreonam (AZACTAM) 1 g in dextrose 5 % 50 mL IVPB  Status:  Discontinued     1 g 100 mL/hr over 30 Minutes Intravenous Every 8 hours 04/26/14 1814 04/27/14 1127   04/26/14  1630  vancomycin (VANCOCIN) 500 mg in sodium chloride 0.9 % 100 mL IVPB     500 mg 100 mL/hr over 60 Minutes Intravenous  Once 04/26/14 1527 04/26/14 1855   04/26/14 1515  levofloxacin (LEVAQUIN) IVPB 750 mg     750 mg 100 mL/hr over 90 Minutes Intravenous  Once 04/26/14 1503 04/26/14 1653   04/26/14 1515  aztreonam (AZACTAM) 2 g in  dextrose 5 % 50 mL IVPB     2 g 100 mL/hr over 30 Minutes Intravenous  Once 04/26/14 1503 04/26/14 1724   04/26/14 1515  vancomycin (VANCOCIN) IVPB 1000 mg/200 mL premix  Status:  Discontinued     1,000 mg 200 mL/hr over 60 Minutes Intravenous  Once 04/26/14 1503 04/26/14 1522     Assessment: 75 yoF with Cerebral Palsy, to ED with rash, fever, hematuria, and diarrhea x 2 days. Hx of urosepsis. Seen in PCP office today; febrile, hypoxic. Rule out sepsis Vancomycin and cefepime per pharmacy. PCN allergy listed - rash  10/5 >> Vancomycin >> 10/5 >> Aztreonam >> 10/6 10/5 >> Levaquin >> 10/6 10/6 >> cefepime >>  Tmax: 100.4 WBCs: WNL Renal:Cr < 0.2 (cerebral palsy, quadriplegia), normalized CrCl (using SCr of 0.8) > 126ml/min, C-G (using SCr = 0.8) = 46ml/min   10/5 blood: NGTD 10/5 urine: Grp B strep (UA = many epi, + pyuria) 10/5 CDiff: Neg  Drug level / dose changes info: 10/7 0945 vancomycin trough < 5 mcg/ml on vancomycin 500mg  IV q12h (prior to 4th dose)  Goal of Therapy:  Vancomycin trough level 15-20 mcg/ml  Plan:   Vancomycin trough undetectable, change vancomycin to Vancomycin 500mg  IV q8h  May require q6h dosing  Repeat trough at new steady state following frequency change  Suspect may be able to stop vancomycin and de-escalate antibiotics with culture results  Continue cefepime 1gm IV q12h  Doreene Eland, PharmD, BCPS.   Pager: [phone] 04/28/2014, 10:43 AM

## 2014-04-29 DIAGNOSIS — A401 Sepsis due to streptococcus, group B: Principal | ICD-10-CM

## 2014-04-29 DIAGNOSIS — G809 Cerebral palsy, unspecified: Secondary | ICD-10-CM

## 2014-04-29 LAB — CBC
HEMATOCRIT: 42.6 % (ref 36.0–46.0)
HEMOGLOBIN: 14.2 g/dL (ref 12.0–15.0)
MCH: 33.3 pg (ref 26.0–34.0)
MCHC: 33.3 g/dL (ref 30.0–36.0)
MCV: 99.8 fL (ref 78.0–100.0)
Platelets: 190 10*3/uL (ref 150–400)
RBC: 4.27 MIL/uL (ref 3.87–5.11)
RDW: 13.6 % (ref 11.5–15.5)
WBC: 4.9 10*3/uL (ref 4.0–10.5)

## 2014-04-29 LAB — BASIC METABOLIC PANEL
Anion gap: 11 (ref 5–15)
BUN: 3 mg/dL — AB (ref 6–23)
CHLORIDE: 102 meq/L (ref 96–112)
CO2: 29 mEq/L (ref 19–32)
Calcium: 8.4 mg/dL (ref 8.4–10.5)
Glucose, Bld: 129 mg/dL — ABNORMAL HIGH (ref 70–99)
Potassium: 3.7 mEq/L (ref 3.7–5.3)
Sodium: 142 mEq/L (ref 137–147)

## 2014-04-29 LAB — GLUCOSE, CAPILLARY
Glucose-Capillary: 112 mg/dL — ABNORMAL HIGH (ref 70–99)
Glucose-Capillary: 121 mg/dL — ABNORMAL HIGH (ref 70–99)
Glucose-Capillary: 76 mg/dL (ref 70–99)
Glucose-Capillary: 87 mg/dL (ref 70–99)

## 2014-04-29 NOTE — Progress Notes (Signed)
Pt had a much more restful night with little to no agitation.

## 2014-04-29 NOTE — Progress Notes (Signed)
[facility] PROGRESS NOTE  Joey Hudock QIH:[id number] DOB: 1986-07-19 DOA: 04/26/2014 PCP: Purvis Kilts, MD  Brief narrative:  Renee [name] is a 28 y.o. female, with known history of cerebral palsy, quadriplegia, severely contracted, with severe intellectual disability , who was brought by her mother secondary to fever of 100.4 at home, and 103 at 53 office,, hematuria/dark-colored urine, and lethargy, as well mother reports patient has been having diarrhea, over the last 2 days, patient's symptoms started on Saturday 10/2, NAD patient was tachycardic in the 130s, patient urin analysis was positive for UTI, chest x-ray did not show any opacity or infiltrate, as well she was noticed to have hip and buttocks cellulitis on physical exam, patient was started on broad-spectrum IV antibiotics, after having blood cultures were sent.  Patient has been admitted to stay down for sepsis, secondary to UTI and hip/buttocks cellulitis, started initially on IV vancomycin, Azactam, levofloxacin,10/5 , switched to IV vancomycin and cefepime on 10/6 , still having low-grade temperature, tachycardia slowly improving,. Fever curve trending down. Discontinued IV vancomycin and will continue IV cefepime until discharge.       Assessment/Plan: #1 sepsis Secondary to urinary tract infection and hip cellulitis. Patient with clinical improvement. Fever curve is trending down. Tachycardia is improving. Urine cultures with group B strep agalactaie. C. difficile was negative. Blood cultures were no growth to date and pending. WBC is trending down. Continue empiric IV cefepime. D/C IV vancomycin. Follow.  #2 UTI Urine cultures growing group B strep agalactaie. Continue IV cefepime. D/C Foley catheter. Follow.  #3 cellulitis of the hips and buttocks Blood cultures with no growth to date. Clinical improvement. Continue IV cefepime. DC IV vancomycin. Will change to Keflex on discharge. Follow.  #4  history of seizures Stable. No seizures noted. Continue primidone.  #5 history of cerebral palsy/severe intellectual disability/quadriplegia Continue current antipsychotic medications via PEG tube. Continue supportive care.  #6 severe protein malnutrition Continue Jevity.  #7 hypokalemia Repleted. Magnesium level of 2.  #8 prophylaxis Continue Zantac for PPI prophylaxis. Heparin for DVT prophylaxis.  Code Status: DO NOT RESUSCITATE Family Communication: Updated aunt at bedside. Disposition Plan: Remain the step down, home tomorrow.   Consultants:  None  Procedures:  Chest x-ray 04/26/2014  Abdominal x-ray 04/26/2014  Antibiotics:  IV cefepime 04/27/2014  IV vancomycin 04/26/2014>>>>04/29/14  IV Levaquin 04/26/2014>>>> 04/27/2014  HPI/Subjective: Patient alert. Patient nonverbal. Patient tracking. Patient smiling with hands in her mouth.  Objective: Filed Vitals:   04/29/14 0900  BP: 115/57  Pulse: 99  Temp: 99.5 F (37.5 C)  Resp: 19    Intake/Output Summary (Last 24 hours) at 04/29/14 0935 Last data filed at 04/29/14 0600  Gross per 24 hour  Intake   2085 ml  Output   2025 ml  Net     60 ml   Filed Weights   04/26/14 1507 04/26/14 1921 04/28/14 0700  Weight: 25.855 kg (57 lb) 27.7 kg (61 lb 1.1 oz) 28.8 kg (63 lb 7.9 oz)    Exam:   General:  NAD  Cardiovascular: tachycardia  Respiratory: CTAB anterior lung fields  Abdomen: Soft, nontender, nondistended, positive bowel sounds.  Musculoskeletal: Extremities in contractures. Decreased erythema around hips, nontender to palpation.  Data Reviewed: Basic Metabolic Panel:  Recent Labs Lab 04/26/14 1503 04/27/14 0345 04/28/14 0600 04/28/14 0605  NA 129* 139  --  136*  K 3.5* 2.8*  --  4.4  CL 88* 105  --  100  CO2 28 25  --  28  GLUCOSE 88 107*  --  77  BUN 5* 3*  --  3*  CREATININE <0.20* <0.20*  --  <0.20*  CALCIUM 8.4 7.5*  --  8.2*  MG  --  1.7 2.0  --    Liver Function  Tests:  Recent Labs Lab 04/26/14 1503  AST 23  ALT 19  ALKPHOS 153*  BILITOT 0.4  PROT 7.2  ALBUMIN 3.1*   No results found for this basename: LIPASE, AMYLASE,  in the last 168 hours No results found for this basename: AMMONIA,  in the last 168 hours CBC:  Recent Labs Lab 04/26/14 1503 04/27/14 0345 04/28/14 0605  WBC 14.6* 9.3 7.6  NEUTROABS 12.9*  --   --   HGB 15.4* 13.2 13.6  HCT 45.3 39.4 40.6  MCV 99.8 100.5* 99.5  PLT 164 130* 177   Cardiac Enzymes: No results found for this basename: CKTOTAL, CKMB, CKMBINDEX, TROPONINI,  in the last 168 hours BNP (last 3 results) No results found for this basename: PROBNP,  in the last 8760 hours CBG:  Recent Labs Lab 04/28/14 0850 04/28/14 1259 04/28/14 1630 04/28/14 2348 04/29/14 0755  GLUCAP 102* 86 121* 112* 87    Recent Results (from the past 240 hour(s))  CULTURE, BLOOD (ROUTINE X 2)     Status: None   Collection Time    04/26/14  3:04 PM      Result Value Ref Range Status   Specimen Description BLOOD RIGHT ARM   Final   Special Requests BOTTLES DRAWN AEROBIC AND ANAEROBIC 5ML   Final   Culture  Setup Time     Final   Value: 04/26/2014 23:03     Performed at [facility]   Culture     Final   Value:        BLOOD CULTURE RECEIVED NO GROWTH TO DATE CULTURE WILL BE HELD FOR 5 DAYS BEFORE ISSUING A FINAL NEGATIVE REPORT     Performed at [facility]   Report Status PENDING   Incomplete  CULTURE, BLOOD (ROUTINE X 2)     Status: None   Collection Time    04/26/14  3:18 PM      Result Value Ref Range Status   Specimen Description BLOOD LEFT HAND   Final   Special Requests BOTTLES DRAWN AEROBIC ONLY 2ML   Final   Culture  Setup Time     Final   Value: 04/26/2014 22:55     Performed at [facility]   Culture     Final   Value:        BLOOD CULTURE RECEIVED NO GROWTH TO DATE CULTURE WILL BE HELD FOR 5 DAYS BEFORE ISSUING A FINAL NEGATIVE REPORT     Performed at [facility]    Report Status PENDING   Incomplete  URINE CULTURE     Status: None   Collection Time    04/26/14  3:29 PM      Result Value Ref Range Status   Specimen Description URINE, RANDOM   Final   Special Requests NONE   Final   Culture  Setup Time     Final   Value: 04/26/2014 21:50     Performed at [facility]     Final   Value: >=100,000 COLONIES/ML     Performed at [facility]   Culture     Final   Value: GROUP B STREP(S.AGALACTIAE)ISOLATED  Note: TESTING AGAINST S. AGALACTIAE NOT ROUTINELY PERFORMED DUE TO PREDICTABILITY OF AMP/PEN/VAN SUSCEPTIBILITY.     Performed at [facility]   Report Status 04/28/2014 FINAL   Final  CLOSTRIDIUM DIFFICILE BY PCR     Status: None   Collection Time    04/26/14  5:58 PM      Result Value Ref Range Status   C difficile by pcr NEGATIVE  NEGATIVE Final   Comment: Performed at [facility]  MRSA PCR SCREENING     Status: None   Collection Time    04/26/14  9:00 PM      Result Value Ref Range Status   MRSA by PCR NEGATIVE  NEGATIVE Final   Comment:            The GeneXpert MRSA Assay (FDA     approved for NASAL specimens     only), is one component of a     comprehensive MRSA colonization     surveillance program. It is not     intended to diagnose MRSA     infection nor to guide or     monitor treatment for     MRSA infections.     Studies: No results found.  Scheduled Meds: . baclofen  10 mg Per Tube Q24H   And  . baclofen  20 mg Per Tube 3 times per day  . budesonide  0.25 mg Nebulization BID  . ceFEPime (MAXIPIME) IV  1 g Intravenous BID  . feeding supplement (JEVITY 1.5 CAL/FIBER)  180 mL Per Tube QID  . heparin  5,000 Units Subcutaneous BID  . Influenza vac split quadrivalent PF  0.5 mL Intramuscular Tomorrow-1000  . primidone  200 mg Per Tube TID  . ranitidine  300 mg Per Tube Daily  . [name]  0.5 mg Per Tube Q24H  . [name]  1 mg Per Tube QPM  . sodium chloride  3 mL  Intravenous Q12H  . tiZANidine  2 mg Per Tube Q24H  . vancomycin  500 mg Intravenous Q8H   Continuous Infusions:    Principal Problem:   Sepsis Active Problems:   Infantile cerebral palsy   Congenital quadriplegia   Protein-calorie malnutrition, severe   LPRD (laryngopharyngeal reflux disease)   UTI (lower urinary tract infection)   Cellulitis of hip   DNR (do not resuscitate)    Time spent: Haskell MD [facility] Pager [phone]. If 7PM-7AM, please contact night-coverage at [email or web address], password [facility] 04/29/2014, 9:35 AM  LOS: 3 days

## 2014-04-30 ENCOUNTER — Telehealth: Payer: Self-pay | Admitting: *Deleted

## 2014-04-30 LAB — GLUCOSE, CAPILLARY
Glucose-Capillary: 82 mg/dL (ref 70–99)
Glucose-Capillary: 117 mg/dL — ABNORMAL HIGH (ref 70–99)

## 2014-04-30 MED ORDER — CEPHALEXIN 125 MG/5ML PO SUSR
500.0000 mg | Freq: Three times a day (TID) | ORAL | Status: AC
Start: 2014-04-30 — End: 2014-05-06

## 2014-04-30 NOTE — Telephone Encounter (Signed)
Morene Antu of [employer] stated she needs a verbal order for this pt to continue in home CNA care. She can be reached at [phone].

## 2014-04-30 NOTE — Discharge Summary (Signed)
Physician Discharge Summary  Renee [name] GLO:[id number] DOB: 06-22-1986 DOA: 04/26/2014  PCP: Purvis Kilts, MD  Admit date: 04/26/2014 Discharge date: 04/30/2014  Time spent: 65 minutes  Recommendations for Outpatient Follow-up:  1. Followup with Purvis Kilts, MD in 1 week. On followup basic metabolic profile as well as a CBC will need to be obtained to followup on patient's electrolytes, renal function, hemoglobin, and white count.  Discharge Diagnoses:  Principal Problem:   Sepsis Active Problems:   UTI (lower urinary tract infection)   Cellulitis of hip   Infantile cerebral palsy   Congenital quadriplegia   Protein-calorie malnutrition, severe   LPRD (laryngopharyngeal reflux disease)   DNR (do not resuscitate)   Discharge Condition: Stable and improved  Diet recommendation: Tube feeds via PEG  Filed Weights   04/26/14 1921 04/28/14 0700 04/30/14 0400  Weight: 27.7 kg (61 lb 1.1 oz) 28.8 kg (63 lb 7.9 oz) 28 kg (61 lb 11.7 oz)    History of present illness:  Renee [name] is a 28 y.o. female, with known history of cerebral palsy, quadriplegia, severely contracted, with severe intellectual disability , who was brought by her mother secondary to fever of 100.4 at home, and 103 at physician's office,, hematuria/dark-colored urine, and lethargy, as well mother reported patient had been having diarrhea, over the last 2 days, patient's symptoms started on Saturday 10/2, NAD patient was tachycardic in the 130s, patient's urinalysis was positive for UTI, chest x-ray did not show any opacity or infiltrate, as well she was noticed to have hip and buttocks cellulitis on physical exam, patient was started on broad-spectrum IV antibiotics, after having blood cultures were sent, hospitalist requested to admit the patient.      Hospital Course:  #1 sepsis  On admission patient was noted to be septic. Patient was pan cultured and placed empirically on broad-spectrum antibiotics  of IV vancomycin, IV Azactam, IV Levaquin. Patient was admitted to the step down unit. Patient was monitored. It was felt patient's sepsis was secondary to urinary tract infection and hip cellulitis. Patient's IV antibiotics were subsequently changed to IV vancomycin IV cefepime. Patient's fever curve improved. Patient's tachycardia improved. Blood cultures had no growth to date. Chest x-ray was negative for acute infiltrate. Urine cultures with group B strep agalactaie. C. difficile was negative. WBC trending down. IV vancomycin was subsequently discontinued and patient was maintained on IV cefepime. Patient be discharged home on Keflex via PEG for 6 more days to complete a ten-day course of thrombotic therapy. Patient will followup with PCP as outpatient.  #2 UTI  Patient had presented with sepsis. Urinalysis which was done was consistent with a UTI. Patient was empirically placed on IV antibiotics of vancomycin, Azactam and Levaquin. About it and subsequently transitioned to IV vancomycin IV cefepime. Urine cultures grew group B strep agalactaie. Patient improved clinically. IV vancomycin was subsequently discontinued and patient was maintained on IV cefepime. Foley catheter was subsequently discontinued with improvement in the cellulitis. Patient be discharged home on 6 more days of Keflex via the PEG to complete a ten-day course of antibiotic therapy.  #3 cellulitis of the hips and buttocks  On admission patient was noted to have a cellulitis of the hips and buttocks. Patient was noted to be septic with fever and a tachycardia. Blood cultures were obtained with no growth to date. Patient was initially placed empirically on IV antibiotics of vancomycin, Azactam and Levaquin. Patient was subsequently transitioned to IV vancomycin IV cefepime with improvement with  a low-grade temperature as well as tachycardia. Patient improved clinically and cellulitis improved. IV vancomycin was discontinued and patient was  maintained on IV cefepime. Patient will be discharged home on Keflex via the PEG tube for 6 more days to complete a ten-day course of antibiotic therapy. Patient will followup with PCP as outpatient.  #4 history of seizures  Stable. No seizures noted throughout the hospitalization. Maintained on primidone.  #5 history of cerebral palsy/severe intellectual disability/quadriplegia  Continued on home regimen of antipsychotic medications via PEG tube.  #6 severe protein malnutrition  Continued on Jevity tube feedings.  #7 hypokalemia  Repleted. Magnesium level of 2.   Procedures: Chest x-ray 04/26/2014  Abdominal x-ray 04/26/2014   Consultations:  None  Discharge Exam: Filed Vitals:   04/30/14 0800  BP: 121/55  Pulse:   Temp: 98.7 F (37.1 C)  Resp: 16    General: NAD Cardiovascular: RRR Respiratory: Some coarse BS anterior lung fields.  Discharge Instructions You were cared for by a hospitalist during your hospital stay. If you have any questions about your discharge medications or the care you received while you were in the hospital after you are discharged, you can call the unit and asked to speak with the hospitalist on call if the hospitalist that took care of you is not available. Once you are discharged, your primary care physician will handle any further medical issues. Please note that NO REFILLS for any discharge medications will be authorized once you are discharged, as it is imperative that you return to your primary care physician (or establish a relationship with a primary care physician if you do not have one) for your aftercare needs so that they can reassess your need for medications and monitor your lab values.  Discharge Instructions   Discharge instructions    Complete by:  As directed   Follow up with Purvis Kilts, MD in 1 week.     Increase activity slowly    Complete by:  As directed           Current Discharge Medication List    START taking  these medications   Details  cephALEXin (KEFLEX) 125 MG/5ML suspension Take 20 mLs (500 mg total) by mouth 3 (three) times daily. Take for 6 days then stop. Qty: 360 mL, Refills: 0      CONTINUE these medications which have NOT CHANGED   Details  acetylcysteine (MUCOMYST) 20 % nebulizer solution Take 2 mLs by nebulization every 4 (four) hours as needed. Qty: 60 mL, Refills: 6    albuterol (PROVENTIL) (2.5 MG/3ML) 0.083% nebulizer solution USE 1 VIAL IN NEBULIZER FOUR TIMES DAILY AS DIRECTED. prn    baclofen (LIORESAL) 10 MG tablet Take 10-20 mg by mouth 4 (four) times daily. Take 2 tablets (20mg ) at 0700. Take 1 tablet (10mg ) at 1100. Take 2 tablets (20mg ) at 1500. Take 2 tablets (20mg ) at 2000.    hydrocortisone cream 0.5 % Apply 1 application topically daily as needed for itching (around feeding tube).    medroxyPROGESTERone (DEPO-PROVERA) 150 MG/ML injection Inject 1 mL (150 mg total) into the muscle every 3 (three) months. Qty: 1 mL, Refills: 4    Nutritional Supplements (FEEDING SUPPLEMENT, JEVITY 1.5 CAL/FIBER,) LIQD Place 237 mLs into feeding tube 4 (four) times daily. 1 can at 0700, 1100, 1500, and 2000.    polyethylene glycol powder (MIRALAX) powder Take 17 g by mouth daily as needed for moderate constipation.     primidone (MYSOLINE) 50 MG tablet Take  4 tablets at 0700, 1500, and 2000.    PULMICORT 0.25 MG/2ML nebulizer solution USE 1 VIAL PER NEBULIZER TWICE DAILY. Qty: 120 mL, Refills: 6    ranitidine (ZANTAC) 150 MG/10ML syrup Place 20 mLs (300 mg total) into feeding tube daily. Qty: 600 mL, Refills: 10    [name] (RISPERDAL) 0.5 MG tablet Take 1 tablet (0.5mg ) at 1100. Take 2 tablets (1mg ) at 1900.    tiZANidine (ZANAFLEX) 2 MG tablet Take 2 mg by mouth once. Take 1 tablet at 0700.    Simethicone (GAS-X INFANT DROPS) 40 MG/0.6ML LIQD Give 40 mg by tube 3 (three) times daily as needed (gas).        Allergies  Allergen Reactions  . Other     Paper and  adhesive tape - blisters  . Penicillins   . Latex Rash and Other (See Comments)    blisters   Follow-up Information   Follow up with Purvis Kilts, MD. Schedule an appointment as soon as possible for a visit in 1 week.   Specialty:  Family Medicine   Contact information:   [address] [place] [address] [phone]        The results of significant diagnostics from this hospitalization (including imaging, microbiology, ancillary and laboratory) are listed below for reference.    Significant Diagnostic Studies: Abd 1 View (kub)  04/26/2014   CLINICAL DATA:  Abdominal pain.  History of cervical palsy.  EXAM: ABDOMEN - 1 VIEW  COMPARISON:  Abdominal CT 08/02/2010.  FINDINGS: Generalized colonic distention is similar to priors. There is no evidence of bowel wall thickening, pneumatosis or free intraperitoneal air. Harrington rods, ventriculoperitoneal shunt, scoliosis and findings of bilateral congenital hip dysplasia are noted.  IMPRESSION: No definite acute findings. Colonic distention is similar to priors.   Electronically Signed   By: Camie Patience M.D.   On: 04/26/2014 21:11   Dg Chest Portable 1 View  04/26/2014   CLINICAL DATA:  Fever, hematuria, vomiting.  EXAM: PORTABLE CHEST - 1 VIEW  COMPARISON:  Single view of the chest 02/04/2014.  FINDINGS: Lungs are clear. Heart size is normal. No pneumothorax or pleural effusion. Spinal stabilization hardware in ventriculostomy shunt catheter are again seen. No acute abnormality.  IMPRESSION: No acute disease.   Electronically Signed   By: Inge Rise M.D.   On: 04/26/2014 15:45    Microbiology: Recent Results (from the past 240 hour(s))  CULTURE, BLOOD (ROUTINE X 2)     Status: None   Collection Time    04/26/14  3:04 PM      Result Value Ref Range Status   Specimen Description BLOOD RIGHT ARM   Final   Special Requests BOTTLES DRAWN AEROBIC AND ANAEROBIC 5ML   Final   Culture  Setup Time     Final   Value:  04/26/2014 23:03     Performed at [facility]   Culture     Final   Value:        BLOOD CULTURE RECEIVED NO GROWTH TO DATE CULTURE WILL BE HELD FOR 5 DAYS BEFORE ISSUING A FINAL NEGATIVE REPORT     Performed at [facility]   Report Status PENDING   Incomplete  CULTURE, BLOOD (ROUTINE X 2)     Status: None   Collection Time    04/26/14  3:18 PM      Result Value Ref Range Status   Specimen Description BLOOD LEFT HAND   Final   Special Requests BOTTLES  DRAWN AEROBIC ONLY 2ML   Final   Culture  Setup Time     Final   Value: 04/26/2014 22:55     Performed at [facility]   Culture     Final   Value:        BLOOD CULTURE RECEIVED NO GROWTH TO DATE CULTURE WILL BE HELD FOR 5 DAYS BEFORE ISSUING A FINAL NEGATIVE REPORT     Performed at [facility]   Report Status PENDING   Incomplete  URINE CULTURE     Status: None   Collection Time    04/26/14  3:29 PM      Result Value Ref Range Status   Specimen Description URINE, RANDOM   Final   Special Requests NONE   Final   Culture  Setup Time     Final   Value: 04/26/2014 21:50     Performed at [facility]     Final   Value: >=100,000 COLONIES/ML     Performed at [facility]   Culture     Final   Value: GROUP B STREP(S.AGALACTIAE)ISOLATED     Note: TESTING AGAINST S. AGALACTIAE NOT ROUTINELY PERFORMED DUE TO PREDICTABILITY OF AMP/PEN/VAN SUSCEPTIBILITY.     Performed at [facility]   Report Status 04/28/2014 FINAL   Final  CLOSTRIDIUM DIFFICILE BY PCR     Status: None   Collection Time    04/26/14  5:58 PM      Result Value Ref Range Status   C difficile by pcr NEGATIVE  NEGATIVE Final   Comment: Performed at [facility]  MRSA PCR SCREENING     Status: None   Collection Time    04/26/14  9:00 PM      Result Value Ref Range Status   MRSA by PCR NEGATIVE  NEGATIVE Final   Comment:            The GeneXpert MRSA Assay (FDA     approved for NASAL  specimens     only), is one component of a     comprehensive MRSA colonization     surveillance program. It is not     intended to diagnose MRSA     infection nor to guide or     monitor treatment for     MRSA infections.     Labs: Basic Metabolic Panel:  Recent Labs Lab 04/26/14 1503 04/27/14 0345 04/28/14 0600 04/28/14 0605 04/29/14 0935  NA 129* 139  --  136* 142  K 3.5* 2.8*  --  4.4 3.7  CL 88* 105  --  100 102  CO2 28 25  --  28 29  GLUCOSE 88 107*  --  77 129*  BUN 5* 3*  --  3* 3*  CREATININE <0.20* <0.20*  --  <0.20* <0.20*  CALCIUM 8.4 7.5*  --  8.2* 8.4  MG  --  1.7 2.0  --   --    Liver Function Tests:  Recent Labs Lab 04/26/14 1503  AST 23  ALT 19  ALKPHOS 153*  BILITOT 0.4  PROT 7.2  ALBUMIN 3.1*   No results found for this basename: LIPASE, AMYLASE,  in the last 168 hours No results found for this basename: AMMONIA,  in the last 168 hours CBC:  Recent Labs Lab 04/26/14 1503 04/27/14 0345 04/28/14 0605 04/29/14 0935  WBC 14.6* 9.3 7.6 4.9  NEUTROABS 12.9*  --   --   --  HGB 15.4* 13.2 13.6 14.2  HCT 45.3 39.4 40.6 42.6  MCV 99.8 100.5* 99.5 99.8  PLT 164 130* 177 190   Cardiac Enzymes: No results found for this basename: CKTOTAL, CKMB, CKMBINDEX, TROPONINI,  in the last 168 hours BNP: BNP (last 3 results) No results found for this basename: PROBNP,  in the last 8760 hours CBG:  Recent Labs Lab 04/29/14 0755 04/29/14 2001 04/29/14 2337 04/30/14 0344 04/30/14 0813  GLUCAP 87 76 121* 82 117*       Signed:  Saloni Lablanc MD [facility] 04/30/2014, 9:35 AM

## 2014-05-02 LAB — CULTURE, BLOOD (ROUTINE X 2)
Culture: NO GROWTH
Culture: NO GROWTH

## 2014-05-03 NOTE — Telephone Encounter (Signed)
Morene Antu called back and I approved continued care for Renee [name]. TG

## 2014-05-03 NOTE — Telephone Encounter (Signed)
I called [employer] and left message on VM for Renee [name] return my call about Renee [name]. TG

## 2014-05-26 ENCOUNTER — Ambulatory Visit (INDEPENDENT_AMBULATORY_CARE_PROVIDER_SITE_OTHER): Payer: 59 | Admitting: Internal Medicine

## 2014-05-26 ENCOUNTER — Encounter: Payer: Self-pay | Admitting: Internal Medicine

## 2014-05-26 ENCOUNTER — Encounter: Payer: Self-pay | Admitting: Pulmonary Disease

## 2014-05-26 ENCOUNTER — Ambulatory Visit (INDEPENDENT_AMBULATORY_CARE_PROVIDER_SITE_OTHER): Payer: 59 | Admitting: Pulmonary Disease

## 2014-05-26 VITALS — BP 86/60 | HR 68

## 2014-05-26 VITALS — BP 118/82 | HR 74 | Wt <= 1120 oz

## 2014-05-26 DIAGNOSIS — R633 Feeding difficulties, unspecified: Secondary | ICD-10-CM

## 2014-05-26 DIAGNOSIS — Z931 Gastrostomy status: Secondary | ICD-10-CM

## 2014-05-26 DIAGNOSIS — J4530 Mild persistent asthma, uncomplicated: Secondary | ICD-10-CM

## 2014-05-26 DIAGNOSIS — J690 Pneumonitis due to inhalation of food and vomit: Secondary | ICD-10-CM

## 2014-05-26 MED ORDER — ACETYLCYSTEINE 20 % IN SOLN: 2.0000 mL | RESPIRATORY_TRACT | Status: DC | PRN

## 2014-05-26 NOTE — Patient Instructions (Addendum)
CC:Dr Sharilyn Sites, Dr Halford Chessman

## 2014-05-26 NOTE — Assessment & Plan Note (Signed)
Continue pulmicort, mucomyst, and prn albuterol.  Continue oxygen prn.

## 2014-05-26 NOTE — Patient Instructions (Signed)
Follow up in 6 months 

## 2014-05-26 NOTE — Progress Notes (Signed)
Chief Complaint  Patient presents with  . Follow-up    Increased Albuterol and Mucomyst d/t increased congestion. Low grade fever 05/24/14. Needs refill of Mucomyst printed. Needs handicapped placard renewed.    CC: Renee [name], Renee [name]   History of Present Illness: Renee [name] is a 28 y.o. female with dyspnea, hypoxemia, and reactive airways disease in setting of cerebral palsy.  She is DNR/DNI.  She is here with her mother who provided history.  She was in hospital in October for sepsis, UTI, and cellulitis.  She had change to her GI regimen.  She is not having as much issues with reflux, and this has helped to decrease frequency of cough.  She has been getting more cough with white sputum this week.  She has not been getting fever.  PMHx, PSHx, Medications, Allergies, Fhx, Shx reviewed.  Physical Exam:  General - Thin, moaning HEENT - no sinus tenderness, clear nasal discharge, no oral exudate Cardiac - s1s2 regular, no murmur Chest - no wheeze/rales Abdomen - soft, PEG tube in place, mild distention Extremities - no edema, contracted Neurologic - awake, non-verbal  Assessment/Plan:  Renee Mires, MD [facility] Pager:  [phone] 05/26/2014, 3:27 PM

## 2014-05-26 NOTE — Assessment & Plan Note (Signed)
Improved after change to GI regimen.

## 2014-05-26 NOTE — Progress Notes (Signed)
Renee [name] [birth year] [id number]  Note: This dictation was prepared with Dragon digital system. Any transcriptional errors that result from this procedure are unintentional.   History of Present Illness: This is a 28 year old white female with cerebral palsy, scoliosis and mental retardation. She has been dependent on percutaneous gastrostomy tube feedings for past 10 years. Mother changes the gastrostomy every 3 months. She has a 14 French 15 mm button. She was hospitalized for aspiration pneumonia last summer and she was having severe cough.  She is now markedly improved since we started  omeprazole 40 mg daily and we have decreased her tube feeding volume to 4 ounces at a time from 8 oz. every 4 hours. Her liquid phase gastric emptying scan showed delayed liquid emptying. But there was no gastroesophageal reflux noted over 60 minute period of time. Mother reports patient's cough has disappeared and she is doing so much better overall.    Past Medical History  Diagnosis Date  . Cerebral palsy   . Mental retardation   . Hydrocephalus   . Seizure disorder   . Osteopenia   . Hip dysplasia     bilateral  . DNR (do not resuscitate)   . DNI (do not intubate)   . Scoliosis   . Asthma     breathing treatment  . DNR (do not resuscitate) 04/28/2014    Past Surgical History  Procedure Laterality Date  . Spinal fusion  2005  . 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 Laparoscopic nissen fundoplication    . Gastrostomy tube placement      at same time as nissen  . Tympanostomy tube placement    . Ileopsoas release Bilateral     Dr Renee [name]  . Tenotomy adductor / hamstring closed Bilateral 04/25/00    Allergies  Allergen Reactions  . Other     Paper and adhesive tape - blisters  . Penicillins   . Latex Rash and Other (See Comments)    blisters    Family history and social history have been reviewed.  Review of Systems: intermittent cough. Weight stable. Negative for  diarrhea  The remainder of the 10 point ROS is negative except as outlined in the H&P  Physical Exam: General Appearance thin, in a wheelchair. Flexion contractures in no distress Eyes  Non icteric  HEENT  Non traumatic, normocephalic  Mouth No lesion, tongue papillated, no cheilosis,drooling Neck Supple without adenopathy, thyroid not enlarged, no carotid bruits, no JVD Lungs bilateral coarse rhonchi COR Normal S1, normal S2, regular rhythm, no murmur, quiet precordium Abdomen percutaneous gastrostomy in left upper quadrant. Stomal site is clean. There is no drainage bowel sounds are active no tenderness Rectal not done Extremities  No pedal edema,contractures Skin No lesions Neurological non-verbal. Does not respond to questions Psychological Normal mood and affect  Assessment and Plan:   28 year old white female with cerebral palsy and aspiration pneumoniae is  significantly improved after reducing Jevity TF  to a half a dose every 4 hours which has improved the gastric emptying. Her gastric emptying scan liquid phase is at upper normal. There was no significant gastroesophageal reflux noted within 60 minutes. She will continue  omeprazole 40 mg liquid daily via PEG tube. She will need change of the gastrostomy in February. We will try to have insurance coverage her PEG.. I will see her in 3 months. She has an appointment with Dr Renee [name] today.    Renee [name] 05/26/2014

## 2014-05-28 ENCOUNTER — Telehealth: Payer: Self-pay | Admitting: Internal Medicine

## 2014-05-28 NOTE — Telephone Encounter (Signed)
Please call pt's mother  And tell her that I will be able to write prescription for the gastrostomy kit, but she has to take it and send it to the place of order. WE CANNOT order it from here. thanx

## 2014-05-28 NOTE — Telephone Encounter (Signed)
Dr Olevia Perches- I have discussed this with [facility]. She states that he have ordered replacement tubing in the past but no a gastrostomy kit. She says we have no way of ordering this and billing to medicare.

## 2014-05-31 NOTE — Telephone Encounter (Signed)
Left a message with Wilburn Cornelia for patient's mother, Stanton Kidney to call back.

## 2014-05-31 NOTE — Telephone Encounter (Signed)
I have spoken to Renee [name] and advised of Dr Nichola Sizer note. She verbalizes understanding. She states that a prescription would not really be helpful but she really does appreciate Dr Olevia Perches attempting to help them out.

## 2014-06-03 ENCOUNTER — Ambulatory Visit (INDEPENDENT_AMBULATORY_CARE_PROVIDER_SITE_OTHER): Payer: 59 | Admitting: Family

## 2014-06-03 ENCOUNTER — Encounter: Payer: Self-pay | Admitting: Family

## 2014-06-03 VITALS — BP 96/74 | HR 92 | Wt <= 1120 oz

## 2014-06-03 DIAGNOSIS — G808 Other cerebral palsy: Secondary | ICD-10-CM

## 2014-06-03 DIAGNOSIS — G40311 Generalized idiopathic epilepsy and epileptic syndromes, intractable, with status epilepticus: Secondary | ICD-10-CM

## 2014-06-03 DIAGNOSIS — F72 Severe intellectual disabilities: Secondary | ICD-10-CM | POA: Diagnosis not present

## 2014-06-03 DIAGNOSIS — Q043 Other reduction deformities of brain: Secondary | ICD-10-CM | POA: Diagnosis not present

## 2014-06-03 DIAGNOSIS — G253 Myoclonus: Secondary | ICD-10-CM

## 2014-06-03 DIAGNOSIS — G40319 Generalized idiopathic epilepsy and epileptic syndromes, intractable, without status epilepticus: Secondary | ICD-10-CM

## 2014-06-03 NOTE — Progress Notes (Signed)
Patient: Renee [name] MRN: [id number] Sex: female DOB: [birth year]   Provider: Rockwell Germany, NP Location of Care: [facility] Child Neurology  Note type: Routine return visit  History of Present Illness: Referral Source: Dr. Halford Chessman History from: her mother Chief Complaint: Seizures  Renee [name] is a 28 y.o. 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 Renee [name] has minor motor seizures that have reduced in frequency to a few times per week. 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 The patient is wheelchair bound and requires total care. She requires nebulizer treatments and intermittent oxygen at home. She has difficulties managing respiratory secretions and sees a pulmonologist regularly. Renee [name] was last seen Nov 24, 2013.   Since she was last seen, Mom said that Renee [name] was admitted to the hospital with pneumonia in June, and was so ill that end of life discussion occurred. Then Renee [name] improved and able to be discharged. After that, she was seen by gastroenterology and has been started on treatment for GERD, which Mom says has significantly helped with coughing spells. Renee [name] has ongoing problems with frequent urinary tract infections and was hospitalized earlier in the year with sepsis along with a urinary tract infection.   Renee [name] has intermittent episodes of irritability and crying. Tizanidine, Baclofen and [name] have worked to reduce her irritability. Mom said that these medications were not given as ordered for a day during the June hospitalization and Renee [name] became extremely agitated and irritable. When the medications were restarted, Renee [name] calmed and was able to be consoled. Mom says that Renee [name] has been calmer recently and has had fewer episodes of agitation at home.   Review of  Systems: 12 system review was unremarkable  Past Medical History  Diagnosis Date  . Cerebral palsy   . Mental retardation   . Hydrocephalus   . Seizure disorder   . Osteopenia   . Hip dysplasia     bilateral  . DNR (do not resuscitate)   . DNI (do not intubate)   . Scoliosis   . Asthma     breathing treatment  . DNR (do not resuscitate) 04/28/2014   Hospitalizations: No., Head Injury: No., Nervous System Infections: No., Immunizations up to date: Yes.   Past Medical History Comments: see Hx.  Surgical History Past Surgical History  Procedure Laterality Date  . Spinal fusion  2005  . 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 Laparoscopic nissen fundoplication    . Gastrostomy tube placement      at same time as nissen  . Tympanostomy tube placement    . Ileopsoas release Bilateral     Dr Susy Frizzle  . Tenotomy adductor / hamstring closed Bilateral 04/25/00    Family History family history includes Breast cancer in her maternal grandmother; Heart disease in her maternal grandfather; Thyroid disease in her maternal aunt and maternal grandfather. Family History is otherwise negative for migraines, seizures, cognitive impairment, blindness, deafness, birth defects, chromosomal disorder, autism.  Social History History   Social History  . Marital Status: Single    Spouse Name: N/A    Number of Children: 0  . Years of Education: N/A   Occupational History  . disabled    Social History Main Topics  . Smoking status: Never Smoker   . Smokeless tobacco: Never Used  .  Alcohol Use: No  . Drug Use: No  . Sexual Activity: No   Other Topics Concern  . None   Social History Narrative   Jessalynn requires total care in all aspects of daily living. She lives with her mother, who receives some help from a home health aide for [place].   Educational level: special education Living with:  mother    Physical Exam BP 96/74 mmHg  Pulse 92  Wt 57 lb (25.855 kg) General: thin female  with increased tone, contractures and wasting of her arms and legs, lying on the exam table  Head: oxycephaly, cranial encephalocele, mid-face hypoplasia  Neck: supple with no carotid or supraclavicular bruits.  Respiratory: lungs clear to auscultation  Cardiovascular: regular rate and rhythm, no murmurs  Musculoskeletal: increased tone, fixed contractures and atrophy of her limbs, with some sparing of the right arm.  Skin: She has callouses on her some of her fingers from chewing on them. She has mild redness on her bony prominences but no areas of broken skin.  Neurologic Exam  Mental Status: She has severe mental retardation. She has no language. 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 She was calm today and smiled occasionally. Cranial Nerves: She has dysconjugate roving eye movements. I could not illicit a red reflex. She occasionally turned to localize sounds but I could not get her to do so consistently. She has lower facial weakness with drooling. She does not protrude the tongue but it appears to be midline.  Motor: Increased tone and rigidity in all extremities. There is some sparing of the right arm, which can be almost fully extended. She has fixed contractures and atrophy in the left arm and both lower extremities. She has tight heel cords. She occasionally rubs her mouth and her head with her right hand.  Sensory: Withdrawal x 4.  Coordination: Unable to follow instructions to assess coordination  Gait and Station: Wheelchair bound  Reflexes: 1+ in the right upper extremity and absent in the other extremities. I could not illicit clonus.   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 Jaimarie has minor motor seizures that have reduced in frequency to a few times per week. She has quadriparesis sparing the right arm to a mild  degree, severe mental retardation, no language, severe dysphagia, and decreased visual acuity. Renee [name] looks quite well today and is calmer than I have seen her in some time. Her mother is a strong advocate for Renee [name] and is devoted to her care. I will see Renee [name] back in follow up in 6 months or sooner if needed.

## 2014-06-03 NOTE — Patient Instructions (Signed)
Continue Renee [name]'s medications without change for now.   Let me know if you have any questions or concerns.   Bring the MOST form to the office and I will update it for you.   Please plan to return for follow up in 6 months or sooner if needed.

## 2014-06-22 ENCOUNTER — Encounter: Payer: Self-pay | Admitting: Adult Health

## 2014-06-22 ENCOUNTER — Ambulatory Visit: Payer: 59

## 2014-06-22 ENCOUNTER — Ambulatory Visit (INDEPENDENT_AMBULATORY_CARE_PROVIDER_SITE_OTHER): Payer: 59 | Admitting: Adult Health

## 2014-06-22 DIAGNOSIS — Z3042 Encounter for surveillance of injectable contraceptive: Secondary | ICD-10-CM

## 2014-06-22 MED ORDER — MEDROXYPROGESTERONE ACETATE 150 MG/ML IM SUSP
150.0000 mg | Freq: Once | INTRAMUSCULAR | Status: AC
  Administered 2014-06-22: 150 mg via INTRAMUSCULAR

## 2014-06-22 NOTE — Progress Notes (Signed)
Pt here for Depo shot. No pregnancy test done due to pt not being sexually active. [place]

## 2014-06-29 ENCOUNTER — Telehealth: Payer: Self-pay | Admitting: *Deleted

## 2014-06-29 NOTE — Telephone Encounter (Signed)
Morene Antu, nurse with [facility], would like a verbal order to continue in home CNA care for this pt. She can be reached at [phone].

## 2014-06-29 NOTE — Telephone Encounter (Signed)
I called verbal order. TG

## 2014-07-01 ENCOUNTER — Other Ambulatory Visit: Payer: Self-pay | Admitting: Family

## 2014-07-06 ENCOUNTER — Other Ambulatory Visit: Payer: Self-pay | Admitting: Urology

## 2014-07-06 ENCOUNTER — Ambulatory Visit (INDEPENDENT_AMBULATORY_CARE_PROVIDER_SITE_OTHER): Payer: 59 | Admitting: Urology

## 2014-07-06 ENCOUNTER — Other Ambulatory Visit: Payer: Self-pay | Admitting: Family

## 2014-07-06 DIAGNOSIS — O99892 Other specified diseases and conditions complicating childbirth: Secondary | ICD-10-CM

## 2014-07-06 DIAGNOSIS — R8271 Bacteriuria: Principal | ICD-10-CM

## 2014-07-06 DIAGNOSIS — G809 Cerebral palsy, unspecified: Secondary | ICD-10-CM

## 2014-07-06 DIAGNOSIS — O9989 Other specified diseases and conditions complicating pregnancy, childbirth and the puerperium: Principal | ICD-10-CM

## 2014-07-06 DIAGNOSIS — R339 Retention of urine, unspecified: Secondary | ICD-10-CM

## 2014-07-06 DIAGNOSIS — N390 Urinary tract infection, site not specified: Secondary | ICD-10-CM

## 2014-07-20 ENCOUNTER — Other Ambulatory Visit: Payer: Self-pay | Admitting: Family

## 2014-07-28 ENCOUNTER — Other Ambulatory Visit: Payer: Self-pay | Admitting: Family

## 2014-07-30 ENCOUNTER — Telehealth: Payer: Self-pay | Admitting: *Deleted

## 2014-07-30 DIAGNOSIS — R454 Irritability and anger: Secondary | ICD-10-CM

## 2014-07-30 MED ORDER — RISPERIDONE 0.5 MG PO TABS: ORAL_TABLET | ORAL | Status: DC

## 2014-07-30 NOTE — Telephone Encounter (Signed)
I called and talked to Mom. She said that Renee [name] has been increasingly irritable over the last 2 weeks. Mom has checked everything that usually makes her irritable, such as constipation, possible bladder infection etc; has tried all comfort measures but Renee [name] continues to scream and will not be calmed. We have talked about increasing the [name] in the past and Mom wants to try that now. I instructed her to increase the [name] 0.5mg  to 2 tablets BID. We may need to give her a dose midday, but Mom hopes this will work as a midday doses is difficulty due to caregiver issues. I asked Mom to monitor her for side effects and to call me next week to let me know how she was doing. Mom agreed with this plan. I sent in updated Rx to pharmacy. TG

## 2014-07-30 NOTE — Telephone Encounter (Signed)
Renee [name], mother, stated the pt, in the past two weeks, has been extremely irritable. She said that the pt has busted her lip and her eyes to the point that the pt has to be restrained. She would like to know if the risperidone can be increased and if yes the Rx has ran out and needs to be refilled. The mother can be reached at [phone].

## 2014-08-02 NOTE — Telephone Encounter (Signed)
I reviewed your note and agree with this plan. 

## 2014-09-14 ENCOUNTER — Ambulatory Visit: Payer: 59

## 2014-09-15 ENCOUNTER — Ambulatory Visit (INDEPENDENT_AMBULATORY_CARE_PROVIDER_SITE_OTHER): Payer: 59 | Admitting: *Deleted

## 2014-09-15 ENCOUNTER — Encounter: Payer: Self-pay | Admitting: *Deleted

## 2014-09-15 DIAGNOSIS — Z3042 Encounter for surveillance of injectable contraceptive: Secondary | ICD-10-CM | POA: Diagnosis not present

## 2014-09-15 MED ORDER — MEDROXYPROGESTERONE ACETATE 150 MG/ML IM SUSP
150.0000 mg | Freq: Once | INTRAMUSCULAR | Status: AC
  Administered 2014-09-15: 150 mg via INTRAMUSCULAR

## 2014-09-15 NOTE — Progress Notes (Signed)
Pt here for Depo. No pregnancy test done due to pt not being sexually active. Reports no problems at this time. Return in 12 weeks for next shot. [place]

## 2014-09-17 ENCOUNTER — Telehealth: Payer: Self-pay | Admitting: *Deleted

## 2014-09-17 NOTE — Telephone Encounter (Signed)
Spoke with [facility] nurse and told her Dr. Olevia Perches has not changed patient's feeding tube. Patient's mother has done this. She states she found the amount and it is 5 cc's

## 2014-09-17 NOTE — Telephone Encounter (Signed)
Received a call from [facility] that is at the home. Patient pulled out her feeding tube. She has replaced it but needs to know how much water to place in the tube to hold it in. She states the family told her 6 cc's but she needs an order. Please, advise.

## 2014-12-06 ENCOUNTER — Other Ambulatory Visit: Payer: Self-pay | Admitting: Adult Health

## 2014-12-07 ENCOUNTER — Ambulatory Visit (INDEPENDENT_AMBULATORY_CARE_PROVIDER_SITE_OTHER): Payer: 59 | Admitting: *Deleted

## 2014-12-07 ENCOUNTER — Encounter: Payer: Self-pay | Admitting: *Deleted

## 2014-12-07 DIAGNOSIS — Z3042 Encounter for surveillance of injectable contraceptive: Secondary | ICD-10-CM | POA: Diagnosis not present

## 2014-12-07 MED ORDER — MEDROXYPROGESTERONE ACETATE 150 MG/ML IM SUSP
150.0000 mg | Freq: Once | INTRAMUSCULAR | Status: AC
  Administered 2014-12-07: 150 mg via INTRAMUSCULAR

## 2014-12-07 NOTE — Progress Notes (Signed)
Pt here for Depo. Reports no problems at this time. Pt not sexually active, so pregnancy test not needed. Return in 12 weeks for next shot. [place]

## 2014-12-08 ENCOUNTER — Ambulatory Visit: Payer: Medicaid Other

## 2014-12-16 ENCOUNTER — Ambulatory Visit (INDEPENDENT_AMBULATORY_CARE_PROVIDER_SITE_OTHER): Payer: 59 | Admitting: Family

## 2014-12-16 ENCOUNTER — Encounter: Payer: Self-pay | Admitting: Family

## 2014-12-16 VITALS — HR 96 | Wt <= 1120 oz

## 2014-12-16 DIAGNOSIS — F72 Severe intellectual disabilities: Secondary | ICD-10-CM

## 2014-12-16 DIAGNOSIS — G40319 Generalized idiopathic epilepsy and epileptic syndromes, intractable, without status epilepticus: Secondary | ICD-10-CM

## 2014-12-16 DIAGNOSIS — G808 Other cerebral palsy: Secondary | ICD-10-CM

## 2014-12-16 DIAGNOSIS — R454 Irritability and anger: Secondary | ICD-10-CM

## 2014-12-16 DIAGNOSIS — G253 Myoclonus: Secondary | ICD-10-CM | POA: Diagnosis not present

## 2014-12-16 DIAGNOSIS — Q043 Other reduction deformities of brain: Secondary | ICD-10-CM | POA: Diagnosis not present

## 2014-12-16 DIAGNOSIS — Z982 Presence of cerebrospinal fluid drainage device: Secondary | ICD-10-CM | POA: Diagnosis not present

## 2014-12-16 DIAGNOSIS — G40311 Generalized idiopathic epilepsy and epileptic syndromes, intractable, with status epilepticus: Secondary | ICD-10-CM

## 2014-12-16 DIAGNOSIS — G910 Communicating hydrocephalus: Secondary | ICD-10-CM

## 2014-12-16 MED ORDER — PRIMIDONE 50 MG PO TABS: ORAL_TABLET | ORAL | Status: DC

## 2014-12-16 NOTE — Progress Notes (Signed)
Patient: Renee [name] MRN: [id number] Sex: female DOB: [birth year]  Provider: Rockwell Germany, NP Location of Care: [facility] Child Neurology  Note type: Routine return visit  History of Present Illness: Referral Source: Dr. Halford Chessman History from: mother and caregiver Chief Complaint: Seizures  Renee [name] is a 29 y.o. 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 Renee [name] has minor motor seizures that typically occur a few times per week. 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 The patient is medically fragile, wheelchair bound and requires total care. She requires nebulizer treatments and intermittent oxygen at home. She has difficulties managing respiratory secretions and sees a pulmonologist regularly. Renee [name] was last seen June 03, 2014.   Mom reports today that Veteran has had an increase in seizure activity over the last month. She said that [place] makes an odd sound, then the seizure begins. Afterwards, she is smiling and then may laugh, then is typically agitated. She is taking and tolerating Mysoline for her seizure disorder.  Renee [name] has intermittent episodes of irritability and crying. Tizanidine, Baclofen and [name] have worked to reduce her irritability. She is irritable today and Mom said that she had a seizure this morning.  Review of Systems: Please see the HPI for neurologic and other pertinent review of systems. Otherwise, the following systems are noncontributory including constitutional, eyes, ears, nose and throat, cardiovascular, respiratory, gastrointestinal, genitourinary, musculoskeletal, skin, endocrine, hematologic/lymph, allergic/immunologic and psychiatric.   Past Medical History  Diagnosis Date  . Cerebral palsy   . Mental retardation     . Hydrocephalus   . Seizure disorder   . Osteopenia   . Hip dysplasia     bilateral  . DNR (do not resuscitate)   . DNI (do not intubate)   . Scoliosis   . Asthma     breathing treatment  . DNR (do not resuscitate) 04/28/2014  . UTI (urinary tract infection)    Hospitalizations: No., Head Injury: No., Nervous System Infections: No., Immunizations up to date: No. Past Medical History Comments: See history  Surgical History Past Surgical History  Procedure Laterality Date  . Spinal fusion  2005  . 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 Laparoscopic nissen fundoplication    . Gastrostomy tube placement      at same time as nissen  . Tympanostomy tube placement    . Ileopsoas release Bilateral     Dr Susy Frizzle  . Tenotomy adductor / hamstring closed Bilateral 04/25/00    Family History family history includes Breast cancer in her maternal grandmother; Heart disease in her maternal grandfather; Thyroid disease in her maternal aunt and maternal grandfather. Family History is otherwise negative for migraines, seizures, cognitive impairment, blindness, deafness, birth defects, chromosomal disorder, autism.  Social History History   Social History  . Marital Status: Single    Spouse Name: N/A  . Number of Children: 0  . Years of Education: N/A   Occupational History  . disabled    Social History Main Topics  . Smoking status: Never Smoker   . Smokeless tobacco: Never Used  . Alcohol Use: No  . Drug Use: No  . Sexual Activity: No   Other Topics Concern  . None   Social History Narrative   Renee [name] requires total care in all aspects of daily  living. She lives with her mother, who receives some help from a home health aide for [place].   Educational level: [place] with:  mother  Hobbies/Interest: none School comments:  Renee [name] does not attend school, she received home based care.  Allergies Allergies  Allergen Reactions  . Other     Paper and adhesive tape -  blisters  . Penicillins   . Latex Rash and Other (See Comments)    blisters    Physical Exam Pulse 96  Wt 56 lb (25.401 kg) I was unable to get a blood pressure today due to her irritability. General: thin female with increased tone, contractures and wasting of her arms and legs, lying on the exam table  Head: oxycephaly, cranial encephalocele, mid-face hypoplasia  Neck: supple with no carotid or supraclavicular bruits.  Respiratory: lungs clear to auscultation  Cardiovascular: regular rate and rhythm, no murmurs  Musculoskeletal: increased tone, fixed contractures and atrophy of her limbs, with some sparing of the right arm.  Skin: She has callouses on her some of her fingers from chewing on them. She has mild redness on her bony prominences but no areas of broken skin.  Neurologic Exam  Mental Status: She has severe mental retardation. She has no language. 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 She was agitated and irritable today. She calmed slightly when she was moved from the exam table to her chair and positioned in front of the window. Cranial Nerves: She has dysconjugate roving eye movements. I could not illicit a red reflex. She occasionally turned to localize sounds but I could not get her to do so consistently. She has lower facial weakness with drooling. She does not protrude the tongue but it appears to be midline.  Motor: Increased tone and rigidity in all extremities. There is some sparing of the right arm, which can be almost fully extended. She has fixed contractures and atrophy in the left arm and both lower extremities. She has tight heel cords. She occasionally rubs her mouth and her head with her right hand.  Sensory: Withdrawal x 4.  Coordination: Unable to follow instructions to assess coordination  Gait and Station: Wheelchair bound  Reflexes: 1+ in the right upper extremity and absent in the other extremities. I could not illicit  clonus.  Impression 1. 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 2. Minor motor seizures 3. Significant intellectual delay 4. Spastic quadriparesis 5. Severe dysphagia 6. Decreased visual acuity 7. Agitation and irritability   Recommendations for plan of care The patient's previous [facility] records were reviewed. Shley has neither had nor required imaging or lab studies since the last visit. 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 [place] has minor motor seizures that have increased in frequency over the last month. I recommended to Mom that we increase the Mysoline by 1 tablet, and asked her to call me if she continues to have increased seizure frequency. Anicka is agitated and irritable today, but calmed somewhat with repositioning. Her mother is a strong advocate for [place] and is devoted to her care. I will see Malyssa back in follow up in 6 months or sooner if needed.   The medication  list was reviewed and reconciled.  No changes were made in the prescribed medications today.  A complete medication list was provided to the patient/caregiver.  Dr. Gaynell Face was consulted regarding the patient.   Total time spent with the patient was 30 minutes, of which 50% or more was spent in counseling and coordination of care.

## 2014-12-16 NOTE — Patient Instructions (Addendum)
Increase the Mysoline 50mg  to 4 tablets in the morning, 4 tablets in the afternoon and 5 tablets at night for 1 week. If she continues to have seizures, we will increase the dose further. She can go to 5 tablets 3 times per day, but I want to know if her seizures continue and the dose needs to increase. I have updated her Mysoline prescription so that you will get more medication.   Continue to give her Motrin if she is irritable and appears to be in pain.  Continue her other medications without change.   Please plan to return for follow up in 6 months or sooner if needed.

## 2014-12-27 ENCOUNTER — Ambulatory Visit (HOSPITAL_COMMUNITY)
Admission: RE | Admit: 2014-12-27 | Discharge: 2014-12-27 | Disposition: A | Discharge status: Home / Self Care | Payer: 59 | Source: Ambulatory Visit | Attending: Urology | Admitting: Urology

## 2014-12-27 ENCOUNTER — Other Ambulatory Visit: Payer: Self-pay | Admitting: Urology

## 2014-12-27 DIAGNOSIS — O99892 Other specified diseases and conditions complicating childbirth: Secondary | ICD-10-CM

## 2014-12-27 DIAGNOSIS — N390 Urinary tract infection, site not specified: Secondary | ICD-10-CM

## 2014-12-27 DIAGNOSIS — R8271 Bacteriuria: Secondary | ICD-10-CM

## 2014-12-27 DIAGNOSIS — O9989 Other specified diseases and conditions complicating pregnancy, childbirth and the puerperium: Secondary | ICD-10-CM

## 2014-12-27 DIAGNOSIS — G809 Cerebral palsy, unspecified: Secondary | ICD-10-CM | POA: Diagnosis not present

## 2015-01-10 ENCOUNTER — Telehealth: Payer: Self-pay | Admitting: *Deleted

## 2015-01-10 NOTE — Telephone Encounter (Signed)
I called and gave verbal order. TG

## 2015-01-10 NOTE — Telephone Encounter (Signed)
Renee [name] from [facility], needs verbal order to continue in home CNA/LPN services for  [employer]. She can be reached at : [phone]

## 2015-01-20 ENCOUNTER — Other Ambulatory Visit: Payer: Self-pay | Admitting: Pulmonary Disease

## 2015-01-20 ENCOUNTER — Other Ambulatory Visit: Payer: Self-pay | Admitting: *Deleted

## 2015-01-20 DIAGNOSIS — R454 Irritability and anger: Secondary | ICD-10-CM

## 2015-01-20 MED ORDER — RISPERIDONE 0.5 MG PO TABS: ORAL_TABLET | ORAL | Status: DC

## 2015-01-20 NOTE — Telephone Encounter (Signed)
Mom called and left a voicemail stating that patient has been extremely irritable the past 2 weeks and has had to increase Resperidone to 2 tablets at 7 am, 1 tablet at 11am, 2 tablets at 3 pm, and 1 tablet at 7pm. Mom states that this has helped with patient's irritability but now needs a new prescription with enough medication, along with instructions to give at the times aforementioned. Mom also states that seizure activity had increased but she increased patient's Primidone and has helped but will report more on this subject next week after more observation.

## 2015-01-20 NOTE — Telephone Encounter (Signed)
Patient's mother Renee [name] was advised that rx was sent in. She verbalized understanding.

## 2015-01-25 ENCOUNTER — Other Ambulatory Visit: Payer: Self-pay | Admitting: Family

## 2015-01-25 ENCOUNTER — Ambulatory Visit (INDEPENDENT_AMBULATORY_CARE_PROVIDER_SITE_OTHER): Payer: 59 | Admitting: Internal Medicine

## 2015-01-25 ENCOUNTER — Ambulatory Visit (INDEPENDENT_AMBULATORY_CARE_PROVIDER_SITE_OTHER): Payer: 59 | Admitting: Urology

## 2015-01-25 ENCOUNTER — Encounter: Payer: Self-pay | Admitting: Internal Medicine

## 2015-01-25 VITALS — HR 88 | Wt <= 1120 oz

## 2015-01-25 DIAGNOSIS — R633 Feeding difficulties, unspecified: Secondary | ICD-10-CM

## 2015-01-25 DIAGNOSIS — N390 Urinary tract infection, site not specified: Secondary | ICD-10-CM

## 2015-01-25 DIAGNOSIS — K5901 Slow transit constipation: Secondary | ICD-10-CM | POA: Diagnosis not present

## 2015-01-25 DIAGNOSIS — G809 Cerebral palsy, unspecified: Secondary | ICD-10-CM

## 2015-01-25 DIAGNOSIS — Z931 Gastrostomy status: Secondary | ICD-10-CM | POA: Diagnosis not present

## 2015-01-25 MED ORDER — POLYETHYLENE GLYCOL 3350 17 GM/SCOOP PO POWD: ORAL | Status: DC

## 2015-01-25 MED ORDER — RANITIDINE HCL 150 MG/10ML PO SYRP: 300.0000 mg | ORAL_SOLUTION | Freq: Every day | ORAL | Status: DC

## 2015-01-25 NOTE — Progress Notes (Signed)
Renee [name] 31-Dec-1985 [id number]  Note: This dictation was prepared with Dragon digital system. Any transcriptional errors that result from this procedure are unintentional.   History of Present Illness: This is a 29 year old white female with severe cerebral palsy and permanent feeding tube. She has mental retardation and scoliosis. PEG has been there for 10 years. Her mother changes PEG every 3 months. It is 14 French 15 mm button. She was hospitalized for aspiration pneumonia in 2014 but has improved on antireflux regimen which includes liquid ranitidine 300 mg in 20 cc every day per PEG.. She has also been on bowel regimen consisting of MiraLAX 17 g when necessary resulting in alternating diarrhea and constipation. There has been no rectal bleeding ,cough or regurgitation    Past Medical History  Diagnosis Date  . Cerebral palsy   . Mental retardation   . Hydrocephalus   . Seizure disorder   . Osteopenia   . Hip dysplasia     bilateral  . DNR (do not resuscitate)   . DNI (do not intubate)   . Scoliosis   . Asthma     breathing treatment  . DNR (do not resuscitate) 04/28/2014  . UTI (urinary tract infection)     Past Surgical History  Procedure Laterality Date  . Spinal fusion  2005  . 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 Laparoscopic nissen fundoplication    . Gastrostomy tube placement      at same time as nissen  . Tympanostomy tube placement    . Ileopsoas release Bilateral     Dr Susy Frizzle  . Tenotomy adductor / hamstring closed Bilateral 04/25/00    Allergies  Allergen Reactions  . Other     Paper and adhesive tape - blisters  . Penicillins   . Latex Rash and Other (See Comments)    blisters    Family history and social history have been reviewed.  Review of Systems: Reported by her mother. Constipation. Negative for rectal bleeding positive for hemorrhoids  The remainder of the 10 point ROS is negative except as outlined in the H&P  Physical  Exam: General Appearance small, thin, in no distress, cries out frequently. She is in a fetal position. Flexion contractures Eyes  Non icteric  HEENT  Non traumatic, normocephalic  Mouth No lesion, tongue papillated, no cheilosis Neck Supple without adenopathy, thyroid not enlarged, no carotid bruits, no JVD Lungs Clear to auscultation bilaterally severe kyphosis COR Normal S1, normal S2, regular rhythm, no murmur, quiet precordium Abdomen mildly protuberant and tympanitic with quiet bowel sounds. Percutaneous gastrostomy in the left upper quadrant. Gastrostomy site is clean. No palpable stool or mass on palpation. Rectal external hemorrhoidal tags. Normal rectal sphincter tone. Medium amount of semisolid and liquid stool which is Hemoccult-negative Extremities  No pedal edema, flexion contractures. Skin No lesions Neurological Alert and oriented x 3, patient is known call me negative Psychological she is crying   Assessment and Plan:   29year-old white female with severe cerebral palsy and mental retardation  dependent on gastrostomy tube feedings. The feedings are going very well. GES liquid phase c/w gastroparesis. Total size of the tube feedings has been decreased in order to avoid  LPR. She has done very well on antireflux measures and liquid ranitidine. Button PEG is  is changed by the mother every 3 months. It appears to be functioning well  Chronic constipation.  We will decrease the total amount of MiraLAX from 17 to  9 g but will increase the frequency to every day or every other day. This will be more likely to normalize her bowel habits.  ROV 6 months      [name] 01/25/2015

## 2015-01-25 NOTE — Patient Instructions (Addendum)
We have sent the following medications to your pharmacy for you to pick up at your convenience:  Ranitidine, Miralax Dr Hilma Favors

## 2015-02-07 ENCOUNTER — Other Ambulatory Visit: Payer: Self-pay | Admitting: Family

## 2015-02-07 DIAGNOSIS — G808 Other cerebral palsy: Secondary | ICD-10-CM

## 2015-02-07 MED ORDER — BACLOFEN 10 MG PO TABS: ORAL_TABLET | ORAL | Status: DC

## 2015-02-08 ENCOUNTER — Telehealth: Payer: Self-pay | Admitting: Family

## 2015-02-08 DIAGNOSIS — R454 Irritability and anger: Secondary | ICD-10-CM

## 2015-02-08 DIAGNOSIS — F72 Severe intellectual disabilities: Secondary | ICD-10-CM

## 2015-02-08 MED ORDER — RISPERIDONE 0.5 MG PO TABS: ORAL_TABLET | ORAL | Status: DC

## 2015-02-08 NOTE — Telephone Encounter (Signed)
I reviewed your note and agree with this plan. 

## 2015-02-08 NOTE — Telephone Encounter (Signed)
Mom Renee [name] left message about Renee [name] saying that she had a question about increasing the [name] does due to severe irritability. She asked for call back at [phone]. I called Mom and she said that [place] had been  extremely irritable over the past 3 to 4 weeks. She had been hitting herself which resulted in her blacking both her eyes and splitting her lip, amount other bruises and abrasions. Mom used the soft restraints but can't leave them on her 24/7, and when she was out of restraints managed to hit herself as described. She was screaming night and day. Mom took her to all her doctors and she was found to be healthy, no constipation, no UTI, no problems other than irritability. Mom gradually began giving her increased doses of [name] as we had discussed in visits that she was on low dose. Renee [name] has been taking [name] 0.5mg  - 2 tablets at 7AM, 2 tablets at 11AM, 2 tablets at 3pm and 1 tablet at bedtime. She has been calm for a week at this dose. Mom asked for an Rx to be sent in for the new dose. I talked to Mom about the irritability and about increasing the dose. I told her that I would send in an Rx for the new dose but not to increase the dose again without call me first. We may need to consider other options if the irritability resumes. Mom agreed with this plan. TG

## 2015-03-01 ENCOUNTER — Ambulatory Visit: Payer: 59

## 2015-03-02 ENCOUNTER — Emergency Department (HOSPITAL_COMMUNITY): Payer: 59

## 2015-03-02 ENCOUNTER — Emergency Department (HOSPITAL_COMMUNITY)
Admission: EM | Admit: 2015-03-02 | Discharge: 2015-03-02 | Disposition: A | Discharge status: Home / Self Care | Payer: 59 | Attending: Emergency Medicine | Admitting: Emergency Medicine

## 2015-03-02 ENCOUNTER — Encounter (HOSPITAL_COMMUNITY): Payer: Self-pay

## 2015-03-02 ENCOUNTER — Telehealth: Payer: Self-pay | Admitting: *Deleted

## 2015-03-02 DIAGNOSIS — G40909 Epilepsy, unspecified, not intractable, without status epilepticus: Secondary | ICD-10-CM | POA: Insufficient documentation

## 2015-03-02 DIAGNOSIS — Z8739 Personal history of other diseases of the musculoskeletal system and connective tissue: Secondary | ICD-10-CM | POA: Insufficient documentation

## 2015-03-02 DIAGNOSIS — S79912A Unspecified injury of left hip, initial encounter: Secondary | ICD-10-CM | POA: Insufficient documentation

## 2015-03-02 DIAGNOSIS — Z87448 Personal history of other diseases of urinary system: Secondary | ICD-10-CM | POA: Diagnosis not present

## 2015-03-02 DIAGNOSIS — R454 Irritability and anger: Secondary | ICD-10-CM

## 2015-03-02 DIAGNOSIS — J45909 Unspecified asthma, uncomplicated: Secondary | ICD-10-CM | POA: Diagnosis not present

## 2015-03-02 DIAGNOSIS — W01198A Fall on same level from slipping, tripping and stumbling with subsequent striking against other object, initial encounter: Secondary | ICD-10-CM | POA: Diagnosis not present

## 2015-03-02 DIAGNOSIS — Z8659 Personal history of other mental and behavioral disorders: Secondary | ICD-10-CM | POA: Insufficient documentation

## 2015-03-02 DIAGNOSIS — Z79899 Other long term (current) drug therapy: Secondary | ICD-10-CM | POA: Diagnosis not present

## 2015-03-02 DIAGNOSIS — Z7951 Long term (current) use of inhaled steroids: Secondary | ICD-10-CM | POA: Diagnosis not present

## 2015-03-02 DIAGNOSIS — Y9289 Other specified places as the place of occurrence of the external cause: Secondary | ICD-10-CM | POA: Insufficient documentation

## 2015-03-02 DIAGNOSIS — M25552 Pain in left hip: Secondary | ICD-10-CM

## 2015-03-02 DIAGNOSIS — Z880 Allergy status to penicillin: Secondary | ICD-10-CM | POA: Diagnosis not present

## 2015-03-02 DIAGNOSIS — Z91040 Latex allergy status: Secondary | ICD-10-CM | POA: Diagnosis not present

## 2015-03-02 DIAGNOSIS — Y998 Other external cause status: Secondary | ICD-10-CM | POA: Diagnosis not present

## 2015-03-02 DIAGNOSIS — Y9389 Activity, other specified: Secondary | ICD-10-CM | POA: Insufficient documentation

## 2015-03-02 DIAGNOSIS — Z87440 Personal history of urinary (tract) infections: Secondary | ICD-10-CM | POA: Diagnosis not present

## 2015-03-02 MED ORDER — HYDROCODONE-ACETAMINOPHEN 7.5-325 MG/15ML PO SOLN
15.0000 mL | Freq: Once | ORAL | Status: AC
  Administered 2015-03-02: 15 mL via ORAL
  Filled 2015-03-02: qty 15

## 2015-03-02 MED ORDER — HYDROCODONE-ACETAMINOPHEN 7.5-325 MG/15ML PO SOLN: 15.0000 mL | Freq: Four times a day (QID) | ORAL | Status: DC | PRN

## 2015-03-02 MED ORDER — DIAZEPAM 1 MG/ML PO SOLN: ORAL | Status: DC

## 2015-03-02 NOTE — Telephone Encounter (Signed)
I called and talked to Renee [name]. She said that Saturday evening, Renee [name] began screaming for no cause that Renee [name] could find. She screamed for several hours, then settled down some, but was restless and remained irritable. That continued Sunday, and Renee [name] picked her up to carry her to another room, she tripped and fell. Renee [name] seemed fine, without apparent injury. She remained restless and nothing done seemed to make her relax. She continued to have episodes of screaming over the past 2 days for 4 hours or more. She rested well one night, then returned to screaming behavior the following day. Today, she has remained very irritable and she did not urinate, and Renee [name] finally had to catheterize her to obtain urine. Her home health nurse felt that perhaps one of her hips looked different and wondered if the hip was displaced. Renee [name] was on way to ER with Renee [name] and will let me know the come. Renee [name] asked that if the irritability could be due to the Depo-Provera injection, because she felt that she had been more irritable since it was given. She also wondered if it could be simply Renee [name]'s mood, as she has gone through periods of extreme irritability before. I told Renee [name] that the Depo-Provera injection did cause worsening mood, similar to a PMS mood, with some women. We also talked about [name]'s mood in general and that as she continues to get older, that her moods may be more variable. Finally, Renee [name] asked for Rx for Diazepam for Renee [name] so that she could give it to her during periods of screaming and increased irritability. She was given Diazepam several years ago and tolerated it well, then her mood improved and no longer needed it. I told Renee [name] that I would send in the Rx for the Diazepam and asked her to let me know if it was beneficial in helping Renee [name] to calm down without causing excessive sedation. Renee [name] agreed to this plan. TG

## 2015-03-02 NOTE — ED Provider Notes (Signed)
CSN: [id number]     Arrival date & time 03/02/15  1455 History   First MD Initiated Contact with Patient 03/02/15 1534     Chief Complaint  Patient presents with  . Hip Pain    Level V caveat: Cerebral palsy  HPI Patient with cerebral palsy and severe MR.  Mom reports that several days ago she was carrying her daughter which she does from time to time and was walking forward when she fell forward and landed on her daughter.  She states since then her daughter seems to be in slightly more pain than usual and seems to localize it to the left hip.  The mother feels that they may be a mild deformity of the left hip.  Patient has severe contractures and cannot provide any history given her CP and MR.  Mom tried to give ibuprofen today without any improvement and feels like she is still having a small amount of pain at this time.  No fevers or chills.  No other skin breakdown.  Mother is without other concerns   Past Medical History  Diagnosis Date  . Cerebral palsy   . Mental retardation   . Hydrocephalus   . Seizure disorder   . Osteopenia   . Hip dysplasia     bilateral  . DNR (do not resuscitate)   . DNI (do not intubate)   . Scoliosis   . Asthma     breathing treatment  . DNR (do not resuscitate) 04/28/2014  . UTI (urinary tract infection)    Past Surgical History  Procedure Laterality Date  . Spinal fusion  2005  . 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 Laparoscopic nissen fundoplication    . Gastrostomy tube placement      at same time as nissen  . Tympanostomy tube placement    . Ileopsoas release Bilateral     Dr Susy Frizzle  . Tenotomy adductor / hamstring closed Bilateral 04/25/00   Family History  Problem Relation Age of Onset  . Breast cancer Maternal Grandmother   . Heart disease Maternal Grandfather   . Thyroid disease Maternal Grandfather   . Thyroid disease Maternal Aunt    Social History  Substance Use Topics  . Smoking status: Never Smoker   . Smokeless  tobacco: Never Used  . Alcohol Use: No   OB History    No data available     Review of Systems  All other systems reviewed and are negative.     Allergies  Other; Penicillins; and Latex  Home Medications   Prior to Admission medications   Medication Sig Start Date End Date Taking? Authorizing Provider  albuterol (PROVENTIL) (2.5 MG/3ML) 0.083% nebulizer solution USE 1 VIAL IN NEBULIZER FOUR TIMES DAILY AS DIRECTED. prn 03/25/14  Yes [name], MD  baclofen (LIORESAL) 10 MG tablet Take 2 tablets (20mg ) at 0700. Take 1 tablet (10mg ) at 1100. Take 2 tablets (20mg ) at 1500. Take 2 tablets (20mg ) at 2000. 02/07/15  Yes Rockwell Germany, NP  hydrocortisone cream 0.5 % Apply 1 application topically daily as needed for itching (around feeding tube).   Yes Historical Provider, MD  Nutritional Supplements (FEEDING SUPPLEMENT, JEVITY 1.5 CAL/FIBER,) LIQD Place 237 mLs into feeding tube 4 (four) times daily. 1 can at 0700, 1100, 1500, and 2000.   Yes Historical Provider, MD  polyethylene glycol powder (MIRALAX) powder Take 1/2 capful every day or every other day - via peg tube -  as needed  for constipation 01/25/15  Yes Lafayette Dragon, MD  primidone (MYSOLINE) 50 MG tablet Give 4 tablets in the morning, 4 tablets in the afternoon and 5 tablets in the evening 12/16/14  Yes Rockwell Germany, NP  PULMICORT 0.25 MG/2ML nebulizer solution USE 1 VIAL PER NEBULIZER TWICE DAILY. 01/20/15  Yes [name], MD  ranitidine (ZANTAC) 150 MG/10ML syrup Place 20 mLs (300 mg total) into feeding tube daily. 01/25/15  Yes Lafayette Dragon, MD  [name] (RISPERDAL) 0.5 MG tablet Give 2 tablets via G-tube at 7AM, 2 tablets at 11AM, 2 tablets at [facility] and 1 tablet via G-tube at bedtime 02/08/15  Yes Rockwell Germany, NP  tiZANidine (ZANAFLEX) 2 MG tablet CRUSH 1 TABLET IN G-TUBE AT BEDTIME AS NEEDED FOR SPASMS. 01/25/15  Yes Rockwell Germany, NP  acetylcysteine (MUCOMYST) 20 % nebulizer solution Take 2 mLs by nebulization every 4  (four) hours as needed. 05/26/14   [name], MD  diazepam (VALIUM) 1 MG/ML solution Give 29ml every 8 hours as needed for agitation Patient not taking: Reported on 03/02/2015 03/02/15   Rockwell Germany, NP  medroxyPROGESTERone (DEPO-PROVERA) 150 MG/ML injection INJECT 1ML INTO THE MUSCLE EVERY 3 MONTHS. 12/06/14   Estill Dooms, NP  Simethicone (GAS-X INFANT DROPS) 40 MG/0.6ML LIQD Give 40 mg by tube 3 (three) times daily as needed (gas).     Historical Provider, MD   BP 110/84 mmHg  Pulse 108  Temp(Src) 98.3 F (36.8 C) (Rectal)  Resp 23  Wt 60 lb (27.216 kg)  SpO2 94% Physical Exam  Constitutional: She is oriented to person, place, and time. No distress.  HENT:  Head: Normocephalic and atraumatic.  Eyes: EOM are normal.  Neck: Neck supple.  Cardiovascular: Normal rate.   Pulmonary/Chest: Effort normal.  Abdominal: Soft. She exhibits no distension.  G-tube in place  Musculoskeletal:  Severe contractures of all 4 extremities.  Able to range left hip and right hip without significant issues.  Bilateral lower extremities are perfused with normal cap refill  Neurological: She is alert and oriented to person, place, and time.  Skin: Skin is warm and dry.  Psychiatric: She has a normal mood and affect. Judgment normal.  Nursing note and vitals reviewed.   ED Course  Procedures (including critical care time) Labs Review Labs Reviewed - No data to display  Imaging Review Dg Hip Unilat With Pelvis 2-3 Views Left  03/02/2015   CLINICAL DATA:  LEFT hip pain, cerebral palsy  EXAM: DG HIP (WITH OR WITHOUT PELVIS) 2-3V LEFT  COMPARISON:  None  FINDINGS: Nonstandard positioning.  Marked osseous demineralization.  Orthopedic hardware extending from the lumbar spine into the iliac bones bilaterally.  Hip joints appear grossly preserved.  Marked osseous lucency at the LEFT femoral head and greater trochanter.  Appearance of the proximal LEFT femur is similar to that seen on prior study of  04/26/2014.  No fracture, dislocation or bone destruction.  IMPRESSION: No definite acute bony abnormalities.   Electronically Signed   By: Lavonia Dana M.D.   On: 03/02/2015 17:28   Dg Femur Min 2 Views Left  03/02/2015   CLINICAL DATA:  Left leg pain.  EXAM: LEFT FEMUR 2 VIEWS  COMPARISON:  Hip radiographs dated 03/02/2015  FINDINGS: There is severe osteopenia. The bones are quite gracile. No fracture or bone destruction. AP view of the distal femur could not be obtained due to patient's condition.  IMPRESSION: No acute abnormalities.   Electronically Signed   By: Lorriane Shire  M.D.   On: 03/02/2015 17:47     EKG Interpretation None      MDM   Final diagnoses:  None    Plain films are without acute abnormality.  The patient feels slightly better per the mother after oral hydrocodone.  Patient be prescribed a short course of this to be administered by mother.  Primary care follow-up.    Jola Schmidt, MD 03/02/15 2204

## 2015-03-02 NOTE — Telephone Encounter (Signed)
[name], patient's mother, called and is requesting a call back from [place] regarding some issues she is having with [name]. She states that [name]'s irritability has increased and that she also fell last Sunday while trying to [place] and her home health is giving her issues with this even though she did not fracture anything. Please call back at [phone].

## 2015-03-02 NOTE — ED Notes (Signed)
Mother reports pt she was carrying patient from bedroom to another room and fell with pt Sunday afternoon.  Mother did not see any injury right away but since then has noticed pt has been more irritable and screaming.  Mother also says she noticed pt sitting more to her left side and has noticed a tremor in left leg.  Mother also says pt has to be cathed once a day but pt will void in diaper.  Mother says pt had not voided all night and when she cathed her this morning, she drained 400cc of urine.  Reports no urinary output since then.

## 2015-03-07 ENCOUNTER — Ambulatory Visit (INDEPENDENT_AMBULATORY_CARE_PROVIDER_SITE_OTHER): Payer: 59 | Admitting: *Deleted

## 2015-03-07 ENCOUNTER — Encounter: Payer: Self-pay | Admitting: *Deleted

## 2015-03-07 DIAGNOSIS — Z3042 Encounter for surveillance of injectable contraceptive: Secondary | ICD-10-CM | POA: Diagnosis not present

## 2015-03-07 MED ORDER — MEDROXYPROGESTERONE ACETATE 150 MG/ML IM SUSP
150.0000 mg | Freq: Once | INTRAMUSCULAR | Status: AC
  Administered 2015-03-07: 150 mg via INTRAMUSCULAR

## 2015-03-07 NOTE — Progress Notes (Signed)
Pt here for Depo. Reports no problems at this time. Pt is not sexually active, so no need for pregnancy test. Return in 12 weeks for next shot. [place]

## 2015-03-07 NOTE — Telephone Encounter (Signed)
I reviewed your note and agree with this plan to provide diazepam when she becomes extremely irritable.  She has severe osteopenia, but no fracture or subluxation.

## 2015-03-11 ENCOUNTER — Other Ambulatory Visit: Payer: Self-pay | Admitting: Pulmonary Disease

## 2015-04-15 ENCOUNTER — Other Ambulatory Visit: Payer: Self-pay | Admitting: Pulmonary Disease

## 2015-04-20 ENCOUNTER — Telehealth: Payer: Self-pay | Admitting: Pulmonary Disease

## 2015-04-20 MED ORDER — ALBUTEROL SULFATE (2.5 MG/3ML) 0.083% IN NEBU: INHALATION_SOLUTION | RESPIRATORY_TRACT | Status: DC

## 2015-04-20 MED ORDER — BUDESONIDE 0.25 MG/2ML IN SUSP: RESPIRATORY_TRACT | Status: DC

## 2015-04-20 NOTE — Telephone Encounter (Signed)
Error.Stanley A Dalton ° °

## 2015-04-20 NOTE — Telephone Encounter (Signed)
Spoke to pt's mother. Advised her that I would send in prescriptions for albuterol and pulmicort. These have been sent in. She also states that Mccall has increased congestion and wheezing about like her to be seen. Pt has been scheduled to see TP on Friday at 4:30pm. Nothing further was needed.

## 2015-04-20 NOTE — Telephone Encounter (Signed)
[phone] pt mother cb

## 2015-04-20 NOTE — Telephone Encounter (Signed)
lmtcb x1 for pt. 

## 2015-04-22 ENCOUNTER — Encounter: Payer: Self-pay | Admitting: Adult Health

## 2015-04-22 ENCOUNTER — Ambulatory Visit (INDEPENDENT_AMBULATORY_CARE_PROVIDER_SITE_OTHER): Payer: 59 | Admitting: Adult Health

## 2015-04-22 VITALS — HR 93 | Temp 97.5°F

## 2015-04-22 DIAGNOSIS — J4531 Mild persistent asthma with (acute) exacerbation: Secondary | ICD-10-CM

## 2015-04-22 DIAGNOSIS — J209 Acute bronchitis, unspecified: Secondary | ICD-10-CM | POA: Diagnosis not present

## 2015-04-22 MED ORDER — LEVOFLOXACIN 500 MG PO TABS: 500.0000 mg | ORAL_TABLET | Freq: Every day | ORAL | Status: DC

## 2015-04-22 NOTE — Patient Instructions (Addendum)
Levaquin 500mg  daily per tube for 7days  Follow up in November as planned and As needed   Please contact office for sooner follow up if symptoms do not improve or worsen or seek emergency care

## 2015-04-22 NOTE — Progress Notes (Signed)
   Subjective:    Patient ID: Renee [name], female    DOB: [birth year], 29 y.o.   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  She is DNR/DNI.    04/22/2015 Acute OV  Patient returns accompanied by her mom and grandma.  Says Renee [name] has had more congestion, more irritable and fussy than usual.  Has tendency toward aspiration pneumonia.  No listlessness or fevers.  She is on her  pulmonary hygiene regimen with Pulmicort. Albuterol and Mucomyst as needed. For copious secretions Says her oxygenation is at home have been doing okay , with O2 sats today around 91% on ra   Denies known fever, hemoptysis, diarrhea, or bloody stools  Mom is leaving for cruise soon.     Review of Systems  Constitutional:   No  weight loss, night sweats,   Fevers, chills, fatigue, or  lassitude.  HEENT:   No headaches,  Difficulty swallowing,  Tooth/dental problems, or  Sore throat,                No sneezing, itching, ear ache, nasal congestion, post nasal drip,   CV:  No chest pain,  Orthopnea, PND, swelling in lower extremities, anasarca, dizziness, palpitations, syncope.   GI  No heartburn, indigestion, abdominal pain, nausea, vomiting, diarrhea, change in bowel habits, loss of appetite, bloody stools.   Resp:    No chest wall deformity  Skin: no rash or lesions.  GU: no dysuria, change in color of urine, no urgency or frequency.  No flank pain, no hematuria   MS:  No joint pain or swelling.  No decreased range of motion.  No back pain.  Psych:  No change in mood or affect. No depression or anxiety.  No memory loss.          Objective:   Physical Exam  General - Thin contracted female in power chair , appears fussy  HEENT -no oral exudate Cardiac - s1s2 regular, no murmur  Chest - Coarse Rhonchi bilaterally  Abdomen - soft, PEG tube in place Extremities - no edema, contracted Neurologic - awake, non-verbal, irritable          Assessment & Plan:

## 2015-04-26 ENCOUNTER — Telehealth: Payer: Self-pay | Admitting: Adult Health

## 2015-04-26 DIAGNOSIS — J209 Acute bronchitis, unspecified: Secondary | ICD-10-CM | POA: Insufficient documentation

## 2015-04-26 NOTE — Assessment & Plan Note (Signed)
Acute Bronchitis in pt at high risk with cerebral palsy  Advised mom and grandma if not improving will need sooner follow up or ER evaluation with cxr   Plan  Levaquin 500mg  daily per tube for 7days  Follow up in November as planned and As needed   Please contact office for sooner follow up if symptoms do not improve or worsen or seek emergency care

## 2015-04-26 NOTE — Telephone Encounter (Signed)
Will route message to TP to make her aware. 

## 2015-04-26 NOTE — Assessment & Plan Note (Signed)
Mild flare with bronchitis  Cont w/ pulmonary hygiene  Hold on steroids for now

## 2015-05-09 ENCOUNTER — Emergency Department (HOSPITAL_COMMUNITY): Payer: 59

## 2015-05-09 ENCOUNTER — Inpatient Hospital Stay (HOSPITAL_COMMUNITY)
Admission: EM | Admit: 2015-05-09 | Discharge: 2015-05-10 | DRG: 177 | Disposition: A | Discharge status: Home Health | Payer: 59 | Attending: Internal Medicine | Admitting: Internal Medicine

## 2015-05-09 ENCOUNTER — Encounter (HOSPITAL_COMMUNITY): Payer: Self-pay | Admitting: Emergency Medicine

## 2015-05-09 DIAGNOSIS — G809 Cerebral palsy, unspecified: Secondary | ICD-10-CM | POA: Diagnosis present

## 2015-05-09 DIAGNOSIS — F79 Unspecified intellectual disabilities: Secondary | ICD-10-CM | POA: Diagnosis present

## 2015-05-09 DIAGNOSIS — R05 Cough: Secondary | ICD-10-CM | POA: Diagnosis present

## 2015-05-09 DIAGNOSIS — J690 Pneumonitis due to inhalation of food and vomit: Secondary | ICD-10-CM | POA: Diagnosis present

## 2015-05-09 DIAGNOSIS — R0902 Hypoxemia: Secondary | ICD-10-CM | POA: Diagnosis present

## 2015-05-09 DIAGNOSIS — Z981 Arthrodesis status: Secondary | ICD-10-CM | POA: Diagnosis not present

## 2015-05-09 DIAGNOSIS — G808 Other cerebral palsy: Secondary | ICD-10-CM | POA: Diagnosis present

## 2015-05-09 DIAGNOSIS — M419 Scoliosis, unspecified: Secondary | ICD-10-CM | POA: Diagnosis present

## 2015-05-09 DIAGNOSIS — Z6820 Body mass index (BMI) 20.0-20.9, adult: Secondary | ICD-10-CM | POA: Diagnosis not present

## 2015-05-09 DIAGNOSIS — M8580 Other specified disorders of bone density and structure, unspecified site: Secondary | ICD-10-CM | POA: Diagnosis present

## 2015-05-09 DIAGNOSIS — Z803 Family history of malignant neoplasm of breast: Secondary | ICD-10-CM | POA: Diagnosis not present

## 2015-05-09 DIAGNOSIS — Z23 Encounter for immunization: Secondary | ICD-10-CM

## 2015-05-09 DIAGNOSIS — Z66 Do not resuscitate: Secondary | ICD-10-CM | POA: Diagnosis present

## 2015-05-09 DIAGNOSIS — J189 Pneumonia, unspecified organism: Secondary | ICD-10-CM

## 2015-05-09 DIAGNOSIS — Z8249 Family history of ischemic heart disease and other diseases of the circulatory system: Secondary | ICD-10-CM

## 2015-05-09 DIAGNOSIS — F72 Severe intellectual disabilities: Secondary | ICD-10-CM | POA: Diagnosis present

## 2015-05-09 DIAGNOSIS — G40909 Epilepsy, unspecified, not intractable, without status epilepticus: Secondary | ICD-10-CM | POA: Diagnosis present

## 2015-05-09 DIAGNOSIS — Z931 Gastrostomy status: Secondary | ICD-10-CM

## 2015-05-09 DIAGNOSIS — E43 Unspecified severe protein-calorie malnutrition: Secondary | ICD-10-CM | POA: Diagnosis present

## 2015-05-09 LAB — COMPREHENSIVE METABOLIC PANEL
ALT: 26 U/L (ref 14–54)
ANION GAP: 8 (ref 5–15)
AST: 30 U/L (ref 15–41)
Albumin: 4.2 g/dL (ref 3.5–5.0)
Alkaline Phosphatase: 158 U/L — ABNORMAL HIGH (ref 38–126)
BILIRUBIN TOTAL: 0.4 mg/dL (ref 0.3–1.2)
BUN: 10 mg/dL (ref 6–20)
CO2: 29 mmol/L (ref 22–32)
Calcium: 9.2 mg/dL (ref 8.9–10.3)
Chloride: 103 mmol/L (ref 101–111)
Creatinine, Ser: <0.3 mg/dL — ABNORMAL LOW (ref 0.44–1.00)
Glucose, Bld: 70 mg/dL (ref 65–99)
POTASSIUM: 4.1 mmol/L (ref 3.5–5.1)
Sodium: 140 mmol/L (ref 135–145)
TOTAL PROTEIN: 7.9 g/dL (ref 6.5–8.1)

## 2015-05-09 LAB — CBC WITH DIFFERENTIAL/PLATELET
BASOS PCT: 0 %
Basophils Absolute: 0 10*3/uL (ref 0.0–0.1)
Eosinophils Absolute: 0.4 10*3/uL (ref 0.0–0.7)
Eosinophils Relative: 8 %
HEMATOCRIT: 51 % — AB (ref 36.0–46.0)
Hemoglobin: 17 g/dL — ABNORMAL HIGH (ref 12.0–15.0)
LYMPHS PCT: 27 %
Lymphs Abs: 1.3 10*3/uL (ref 0.7–4.0)
MCH: 34.6 pg — ABNORMAL HIGH (ref 26.0–34.0)
MCHC: 33.3 g/dL (ref 30.0–36.0)
MCV: 103.9 fL — AB (ref 78.0–100.0)
MONO ABS: 0.6 10*3/uL (ref 0.1–1.0)
MONOS PCT: 12 %
NEUTROS ABS: 2.6 10*3/uL (ref 1.7–7.7)
Neutrophils Relative %: 53 %
Platelets: 200 10*3/uL (ref 150–400)
RBC: 4.91 MIL/uL (ref 3.87–5.11)
RDW: 13.2 % (ref 11.5–15.5)
WBC: 4.9 10*3/uL (ref 4.0–10.5)

## 2015-05-09 MED ORDER — HYDROCODONE-ACETAMINOPHEN 7.5-325 MG/15ML PO SOLN
15.0000 mL | Freq: Four times a day (QID) | ORAL | Status: DC | PRN
  Administered 2015-05-09: 15 mL via ORAL
  Filled 2015-05-09: qty 15

## 2015-05-09 MED ORDER — POLYETHYLENE GLYCOL 3350 17 GM/SCOOP PO POWD: 0.5000 | Freq: Every day | ORAL | Status: DC | PRN

## 2015-05-09 MED ORDER — JEVITY 1.5 CAL/FIBER PO LIQD
237.0000 mL | Freq: Four times a day (QID) | ORAL | Status: DC
  Administered 2015-05-09 – 2015-05-10 (×3): 237 mL
  Filled 2015-05-09 (×8): qty 1000

## 2015-05-09 MED ORDER — RANITIDINE HCL 150 MG/10ML PO SYRP
300.0000 mg | ORAL_SOLUTION | Freq: Every day | ORAL | Status: DC
  Administered 2015-05-10: 300 mg
  Filled 2015-05-09 (×2): qty 20

## 2015-05-09 MED ORDER — TIZANIDINE HCL 4 MG PO TABS
2.0000 mg | ORAL_TABLET | Freq: Every day | ORAL | Status: DC
  Administered 2015-05-09: 2 mg
  Filled 2015-05-09 (×3): qty 1

## 2015-05-09 MED ORDER — SODIUM CHLORIDE 0.9 % IV SOLN
INTRAVENOUS | Status: DC
  Administered 2015-05-09: 18:00:00 via INTRAVENOUS

## 2015-05-09 MED ORDER — CETYLPYRIDINIUM CHLORIDE 0.05 % MT LIQD: 7.0000 mL | Freq: Two times a day (BID) | OROMUCOSAL | Status: DC

## 2015-05-09 MED ORDER — ONDANSETRON HCL 4 MG PO TABS: 4.0000 mg | ORAL_TABLET | Freq: Four times a day (QID) | ORAL | Status: DC | PRN

## 2015-05-09 MED ORDER — POLYETHYLENE GLYCOL 3350 17 G PO PACK: 17.0000 g | PACK | Freq: Every day | ORAL | Status: DC

## 2015-05-09 MED ORDER — BACLOFEN 10 MG PO TABS
10.0000 mg | ORAL_TABLET | Freq: Four times a day (QID) | ORAL | Status: DC
  Administered 2015-05-09 – 2015-05-10 (×4): 10 mg
  Filled 2015-05-09 (×4): qty 1

## 2015-05-09 MED ORDER — DIAZEPAM 1 MG/ML PO SOLN: 2.0000 mg | Freq: Three times a day (TID) | ORAL | Status: DC | PRN

## 2015-05-09 MED ORDER — CLINDAMYCIN PHOSPHATE 600 MG/50ML IV SOLN
600.0000 mg | Freq: Three times a day (TID) | INTRAVENOUS | Status: DC
  Administered 2015-05-09 – 2015-05-10 (×3): 600 mg via INTRAVENOUS
  Filled 2015-05-09 (×6): qty 50

## 2015-05-09 MED ORDER — BUDESONIDE 0.25 MG/2ML IN SUSP
0.2500 mg | Freq: Two times a day (BID) | RESPIRATORY_TRACT | Status: DC
  Administered 2015-05-09 – 2015-05-10 (×2): 0.25 mg via RESPIRATORY_TRACT
  Filled 2015-05-09 (×4): qty 2

## 2015-05-09 MED ORDER — ENOXAPARIN SODIUM 30 MG/0.3ML ~~LOC~~ SOLN
30.0000 mg | SUBCUTANEOUS | Status: DC
  Administered 2015-05-09: 30 mg via SUBCUTANEOUS
  Filled 2015-05-09: qty 0.3

## 2015-05-09 MED ORDER — CLINDAMYCIN PHOSPHATE 600 MG/50ML IV SOLN
600.0000 mg | Freq: Once | INTRAVENOUS | Status: AC
  Administered 2015-05-09: 600 mg via INTRAVENOUS
  Filled 2015-05-09: qty 50

## 2015-05-09 MED ORDER — ALBUTEROL SULFATE (2.5 MG/3ML) 0.083% IN NEBU
2.5000 mg | INHALATION_SOLUTION | RESPIRATORY_TRACT | Status: DC | PRN
  Administered 2015-05-10 (×3): 2.5 mg via RESPIRATORY_TRACT
  Filled 2015-05-09 (×3): qty 3

## 2015-05-09 MED ORDER — POLYETHYLENE GLYCOL 3350 17 G PO PACK: 17.0000 g | PACK | Freq: Every day | ORAL | Status: DC | PRN

## 2015-05-09 MED ORDER — ENOXAPARIN SODIUM 40 MG/0.4ML ~~LOC~~ SOLN: 40.0000 mg | SUBCUTANEOUS | Status: DC

## 2015-05-09 MED ORDER — CLINDAMYCIN PHOSPHATE 600 MG/50ML IV SOLN
600.0000 mg | Freq: Three times a day (TID) | INTRAVENOUS | Status: DC
  Filled 2015-05-09 (×3): qty 50

## 2015-05-09 MED ORDER — ONDANSETRON HCL 4 MG/2ML IJ SOLN: 4.0000 mg | Freq: Four times a day (QID) | INTRAMUSCULAR | Status: DC | PRN

## 2015-05-09 MED ORDER — CHLORHEXIDINE GLUCONATE 0.12 % MT SOLN
15.0000 mL | Freq: Two times a day (BID) | OROMUCOSAL | Status: DC
  Administered 2015-05-09 – 2015-05-10 (×2): 15 mL via OROMUCOSAL
  Filled 2015-05-09 (×2): qty 15

## 2015-05-09 MED ORDER — INFLUENZA VAC SPLIT QUAD 0.5 ML IM SUSY
0.5000 mL | PREFILLED_SYRINGE | INTRAMUSCULAR | Status: AC
  Administered 2015-05-10: 0.5 mL via INTRAMUSCULAR
  Filled 2015-05-09: qty 0.5

## 2015-05-09 MED ORDER — RISPERIDONE 1 MG PO TABS
1.0000 mg | ORAL_TABLET | Freq: Four times a day (QID) | ORAL | Status: DC
  Administered 2015-05-09 – 2015-05-10 (×4): 1 mg
  Filled 2015-05-09 (×4): qty 1

## 2015-05-09 MED ORDER — PRIMIDONE 50 MG PO TABS
50.0000 mg | ORAL_TABLET | Freq: Three times a day (TID) | ORAL | Status: DC
  Administered 2015-05-09 – 2015-05-10 (×3): 50 mg
  Filled 2015-05-09 (×7): qty 1

## 2015-05-09 NOTE — ED Provider Notes (Signed)
CSN: [id number]     Arrival date & time 05/09/15  1059 History  By signing my name below, I, Stephania Fragmin, attest that this documentation has been prepared under the direction and in the presence of Harvel Quale, MD. Electronically Signed: Stephania Fragmin, ED Scribe. 05/09/2015. 12:44 PM.    Chief Complaint  Patient presents with  . Pneumonia   The history is provided by a parent. No language interpreter was used.    HPI Comments: Oneisha Ammons is a 29 y.o. female with a history of cerebral palsy, MR, hydrocephalus, seizure disorder, and asthma, who presents to the Emergency Department complaining with multiple complaints, including cough, nasal congestion, hoarse voice, nausea, and vomiting. Patient had seen her PCP in the past week and was placed on Levaquin for congestion and cough, but she has had no improvement. She had been to her PCP today and was suspected to have aspiration pneumonia, so she was sent to be evaluated and potentially receive IV antibiotics. Patient has not yet received her annual flu vaccine.  Past Medical History  Diagnosis Date  . Cerebral palsy ([place])   . Mental retardation   . Hydrocephalus   . Seizure disorder ([place])   . Osteopenia   . Hip dysplasia     bilateral  . DNR (do not resuscitate)   . DNI (do not intubate)   . Scoliosis   . Asthma     breathing treatment  . DNR (do not resuscitate) 04/28/2014  . UTI (urinary tract infection)    Past Surgical History  Procedure Laterality Date  . Spinal fusion  2005  . 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 Laparoscopic nissen fundoplication    . Gastrostomy tube placement      at same time as nissen  . Tympanostomy tube placement    . Ileopsoas release Bilateral     Dr Susy Frizzle  . Tenotomy adductor / hamstring closed Bilateral 04/25/00   Family History  Problem Relation Age of Onset  . Breast cancer Maternal Grandmother   . Heart disease Maternal Grandfather   . Thyroid disease Maternal Grandfather   .  Thyroid disease Maternal Aunt    Social History  Substance Use Topics  . Smoking status: Never Smoker   . Smokeless tobacco: Never Used  . Alcohol Use: No   OB History    No data available     ROS limited and obtained from caregiver and mother as patient unable to communicate verbally.  Review of Systems  Constitutional: Negative for fever, chills, appetite change and fatigue.  HENT: Positive for congestion, sore throat and voice change.   Respiratory: Positive for cough.   Gastrointestinal: Positive for nausea and vomiting.  Genitourinary: Negative for hematuria and decreased urine volume.  Skin: Negative for rash.  Neurological: Negative for syncope and weakness.  All other systems reviewed and are negative.  Allergies  Other; Penicillins; and Latex  Home Medications   Prior to Admission medications   Medication Sig Start Date End Date Taking? Authorizing Provider  albuterol (PROVENTIL) (2.5 MG/3ML) 0.083% nebulizer solution USE 1 VIAL IN NEBULIZER FOUR TIMES DAILY AS DIRECTED. prn 04/20/15  Yes [name], MD  baclofen (LIORESAL) 10 MG tablet Take 2 tablets (20mg ) at 0700. Take 1 tablet (10mg ) at 1100. Take 2 tablets (20mg ) at 1500. Take 2 tablets (20mg ) at 2000. 02/07/15  Yes Rockwell Germany, NP  budesonide (PULMICORT) 0.25 MG/2ML nebulizer solution USE 1 VIAL PER NEBULIZER TWICE DAILY. 04/20/15  Yes [name], MD  diazepam (VALIUM) 1 MG/ML solution Give 69ml every 8 hours as needed for agitation 03/02/15  Yes Rockwell Germany, NP  HYDROcodone-acetaminophen (HYCET) 7.5-325 mg/15 ml solution Take 15 mLs by mouth 4 (four) times daily as needed for moderate pain. 03/02/15 03/01/16 Yes Jola Schmidt, MD  medroxyPROGESTERone (DEPO-PROVERA) 150 MG/ML injection INJECT 1ML INTO THE MUSCLE EVERY 3 MONTHS. 12/06/14  Yes Estill Dooms, NP  Nutritional Supplements (FEEDING SUPPLEMENT, JEVITY 1.5 CAL/FIBER,) LIQD Place 237 mLs into feeding tube 4 (four) times daily. 1 can at 0700, 1100,  1500, and 2000.   Yes Historical Provider, MD  polyethylene glycol powder (MIRALAX) powder Take 1/2 capful every day or every other day - via peg tube -  as needed for constipation 01/25/15  Yes Lafayette Dragon, MD  primidone (MYSOLINE) 50 MG tablet Give 4 tablets in the morning, 4 tablets in the afternoon and 5 tablets in the evening 12/16/14  Yes Rockwell Germany, NP  ranitidine (ZANTAC) 150 MG/10ML syrup Place 20 mLs (300 mg total) into feeding tube daily. 01/25/15  Yes Lafayette Dragon, MD  [name] (RISPERDAL) 0.5 MG tablet Give 2 tablets via G-tube at 7AM, 2 tablets at 11AM, 2 tablets at [facility] and 1 tablet via G-tube at bedtime 02/08/15  Yes Rockwell Germany, NP  tiZANidine (ZANAFLEX) 2 MG tablet CRUSH 1 TABLET IN G-TUBE AT BEDTIME AS NEEDED FOR SPASMS. 01/25/15  Yes Rockwell Germany, NP  levofloxacin (LEVAQUIN) 500 MG tablet Place 1 tablet (500 mg total) into feeding tube daily. Patient not taking: Reported on 05/09/2015 04/22/15   Tammy S Parrett, NP   BP 120/78 mmHg  Pulse 108  Temp(Src) 97.9 F (36.6 C) (Axillary)  Resp 20  Ht 4' (1.219 m)  Wt 65 lb 14.7 oz (29.9 kg)  BMI 20.12 kg/m2  SpO2 91% Physical Exam  Constitutional: She appears well-developed and well-nourished. No distress.  HENT:  Head: Normocephalic and atraumatic.  Mouth/Throat: Oropharynx is clear and moist. Mucous membranes are not dry. No posterior oropharyngeal edema, posterior oropharyngeal erythema or tonsillar abscesses.  Eyes: Conjunctivae and EOM are normal.  Neck: Neck supple. No tracheal deviation present.  Cardiovascular: Normal rate.   Pulmonary/Chest: Effort normal. No respiratory distress. She has rhonchi (diffuse, worse on left compared to right). She has rales (diffuse).  Abdominal: Soft. She exhibits no distension. There is no tenderness.  Musculoskeletal: Normal range of motion.  Contracted limbs with muscular atrophy in all limbs  Neurological: She is alert.  Appears at patient baseline per mother and care  giver, contracted limbs with muscular atrophy, increased muscle tone  Skin: Skin is warm and dry.  Psychiatric: She has a normal mood and affect. Her behavior is normal.  Nursing note and vitals reviewed.   ED Course  Procedures (including critical care time)  DIAGNOSTIC STUDIES: Oxygen Saturation is 92% on RA, adequate by my interpretation.    COORDINATION OF CARE: 11:43 AM - Discussed treatment plan with pt's mother at bedside, which includes diagnostic labwork and imaging. Pt's mother verbalized understanding and agreed to plan.   Labs Review Labs Reviewed  CBC WITH DIFFERENTIAL/PLATELET - Abnormal; Notable for the following:    Hemoglobin 17.0 (*)    HCT 51.0 (*)    MCV 103.9 (*)    MCH 34.6 (*)    All other components within normal limits  COMPREHENSIVE METABOLIC PANEL - Abnormal; Notable for the following:    Creatinine, Ser <0.30 (*)    Alkaline Phosphatase 158 (*)  All other components within normal limits    Imaging Review Dg Chest Port 1 View  05/09/2015  CLINICAL DATA:  Cough and congestion for 2-3 weeks EXAM: PORTABLE CHEST 1 VIEW COMPARISON:  04/26/2014 FINDINGS: Limited inspiratory effect. Scoliosis with Harrington rods. Enlarged cardiac silhouette. Hazy bilateral perihilar opacities left greater than right. Limited evaluation due to limited inspiratory effect. VP shunt tube noted on the right. Similar to numerous prior studies, there is large bowel gaseous distention. IMPRESSION: Limited study with limited inspiratory effect for this single AP view. Hazy perihilar opacities left greater than right likely largely due to atelectasis although pneumonia in the left perihilar area is not excluded. Electronically Signed   By: Skipper Cliche M.D.   On: 05/09/2015 12:15   I have personally reviewed and evaluated these images and lab results as part of my medical decision-making.  MDM  Patient seen and evaluated in stable condition.  Xray concerning for pneumonia and  concern clinically for aspiration.  Patient already treated with Levaquin outpatient x1 week.  Patient intertmittently hypoxic but mother reports baseline 90%.  No respiratory distress.  Patient started on IV Clindamycin.  Discussed with Dr. Anastasio Champion who agreed with admission and patient admitted to the medical floor under his care. Final diagnoses:  Aspiration pneumonia, unspecified aspiration pneumonia type, unspecified laterality, unspecified part of lung ([place])    1. Community acquired pneumonia, likely aspiration  I personally performed the services described in this documentation, which was scribed in my presence. The recorded information has been reviewed and is accurate.      Harvel Quale, MD 05/09/15 [phone]

## 2015-05-09 NOTE — ED Notes (Signed)
Pt sent from PCP office for ? Aspiration pneumonia.

## 2015-05-09 NOTE — H&P (Signed)
[facility] History and Physical  Renee [name] JKD:[id number] DOB: November 05, 1985 DOA: 05/09/2015  Referring physician: ER PCP: Renee Kilts, MD   Chief Complaint: Cough, nasal congestion.  HPI: Renee [name] is a 29 y.o. female  This is a 29 year old lady who has severe cerebral palsy with mental retardation, hydrocodone as, seizure disorder who presents with cough, nasal congestion nausea and vomiting. She is fed by PEG tube. She has been treated as an outpatient by her primary care physician with Levaquin for double pneumonia but she has really not improved. The mother, who is at the bedside, denies any specific fevers. The mother tells me that the patient has had several episodes of aspiration before. She is now being admitted for further management.   Review of Systems:  Unable to obtain secondary to the patient's severe mental retardation.  Past Medical History  Diagnosis Date  . Cerebral palsy ([place])   . Mental retardation   . Hydrocephalus   . Seizure disorder ([place])   . Osteopenia   . Hip dysplasia     bilateral  . DNR (do not resuscitate)   . DNI (do not intubate)   . Scoliosis   . Asthma     breathing treatment  . DNR (do not resuscitate) 04/28/2014  . UTI (urinary tract infection)    Past Surgical History  Procedure Laterality Date  . Spinal fusion  2005  . 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 Laparoscopic nissen fundoplication    . Gastrostomy tube placement      at same time as nissen  . Tympanostomy tube placement    . Ileopsoas release Bilateral     Dr Susy Frizzle  . Tenotomy adductor / hamstring closed Bilateral 04/25/00   Social History:  reports that she has never smoked. She has never used smokeless tobacco. She reports that she does not drink alcohol or use illicit drugs.  Allergies  Allergen Reactions  . Other     Paper and adhesive tape - blisters  . Penicillins Rash    Has patient had a PCN reaction causing immediate rash,  facial/tongue/throat swelling, SOB or lightheadedness with hypotension: NO Has patient had a PCN reaction causing severe rash involving mucus membranes or skin necrosis: No NO Has patient had a PCN reaction that required hospitalization Yes NO Has patient had a PCN reaction occurring within the last 10 years: Yes NO If all of the above answers are "NO", then may proceed with Cephalosporin use.   . Latex Rash and Other (See Comments)    blisters    Family History  Problem Relation Age of Onset  . Breast cancer Maternal Grandmother   . Heart disease Maternal Grandfather   . Thyroid disease Maternal Grandfather   . Thyroid disease Maternal Aunt      Prior to Admission medications   Medication Sig Start Date End Date Taking? Authorizing Provider  albuterol (PROVENTIL) (2.5 MG/3ML) 0.083% nebulizer solution USE 1 VIAL IN NEBULIZER FOUR TIMES DAILY AS DIRECTED. prn 04/20/15  Yes [name], MD  baclofen (LIORESAL) 10 MG tablet Take 2 tablets (20mg ) at 0700. Take 1 tablet (10mg ) at 1100. Take 2 tablets (20mg ) at 1500. Take 2 tablets (20mg ) at 2000. 02/07/15  Yes Rockwell Germany, NP  budesonide (PULMICORT) 0.25 MG/2ML nebulizer solution USE 1 VIAL PER NEBULIZER TWICE DAILY. 04/20/15  Yes [name], MD  diazepam (VALIUM) 1 MG/ML solution Give 55ml every 8 hours as needed for agitation 03/02/15  Yes Otila Kluver  Goodpasture, NP  HYDROcodone-acetaminophen (HYCET) 7.5-325 mg/15 ml solution Take 15 mLs by mouth 4 (four) times daily as needed for moderate pain. 03/02/15 03/01/16 Yes Jola Schmidt, MD  medroxyPROGESTERone (DEPO-PROVERA) 150 MG/ML injection INJECT 1ML INTO THE MUSCLE EVERY 3 MONTHS. 12/06/14  Yes Estill Dooms, NP  Nutritional Supplements (FEEDING SUPPLEMENT, JEVITY 1.5 CAL/FIBER,) LIQD Place 237 mLs into feeding tube 4 (four) times daily. 1 can at 0700, 1100, 1500, and 2000.   Yes Historical Provider, MD  polyethylene glycol powder (MIRALAX) powder Take 1/2 capful every day or every other day -  via peg tube -  as needed for constipation 01/25/15  Yes Lafayette Dragon, MD  primidone (MYSOLINE) 50 MG tablet Give 4 tablets in the morning, 4 tablets in the afternoon and 5 tablets in the evening 12/16/14  Yes Rockwell Germany, NP  ranitidine (ZANTAC) 150 MG/10ML syrup Place 20 mLs (300 mg total) into feeding tube daily. 01/25/15  Yes Lafayette Dragon, MD  [name] (RISPERDAL) 0.5 MG tablet Give 2 tablets via G-tube at 7AM, 2 tablets at 11AM, 2 tablets at [facility] and 1 tablet via G-tube at bedtime 02/08/15  Yes Rockwell Germany, NP  tiZANidine (ZANAFLEX) 2 MG tablet CRUSH 1 TABLET IN G-TUBE AT BEDTIME AS NEEDED FOR SPASMS. 01/25/15  Yes Rockwell Germany, NP  levofloxacin (LEVAQUIN) 500 MG tablet Place 1 tablet (500 mg total) into feeding tube daily. Patient not taking: Reported on 05/09/2015 04/22/15   Melvenia Needles, NP   Physical Exam: Filed Vitals:   05/09/15 1108 05/09/15 1115  BP: 106/84   Pulse: 98   Temp:  98.4 F (36.9 C)  TempSrc:  Rectal  Resp: 16   Height: 3\' 10"  (1.168 m)   Weight: 25.401 kg (56 lb)   SpO2: 92%     Wt Readings from Last 3 Encounters:  05/09/15 25.401 kg (56 lb)  03/02/15 27.216 kg (60 lb)  01/25/15 26.309 kg (58 lb)    General:  Appears uncomfortable. She is crying out. She does not appear to be febrile. She is malnourished severely. Eyes: PERRL, normal lids, irises & conjunctiva ENT: grossly normal hearing, lips & tongue Neck: no LAD, masses or thyromegaly Cardiovascular: RRR, no m/r/g. No LE edema. Telemetry: SR, no arrhythmias  Respiratory: Difficult to examine her lungs with her constant crying but possibly reduced air entry in the lower bases bilaterally. Abdomen: soft, ntnd Skin: no rash or induration seen on limited exam Musculoskeletal: She is contracted. Psychiatric: Not examined. Neurologic: Quadriplegia.           Labs on Admission:  Basic Metabolic Panel:  Recent Labs Lab 05/09/15 1159  NA 140  K 4.1  CL 103  CO2 29  GLUCOSE 70  BUN  10  CREATININE <0.30*  CALCIUM 9.2   Liver Function Tests:  Recent Labs Lab 05/09/15 1159  AST 30  ALT 26  ALKPHOS 158*  BILITOT 0.4  PROT 7.9  ALBUMIN 4.2   No results for input(s): LIPASE, AMYLASE in the last 168 hours. No results for input(s): AMMONIA in the last 168 hours. CBC:  Recent Labs Lab 05/09/15 1159  WBC 4.9  NEUTROABS 2.6  HGB 17.0*  HCT 51.0*  MCV 103.9*  PLT 200   Cardiac Enzymes: No results for input(s): CKTOTAL, CKMB, CKMBINDEX, TROPONINI in the last 168 hours.  BNP (last 3 results) No results for input(s): BNP in the last 8760 hours.  ProBNP (last 3 results) No results for input(s): PROBNP in the last 8760  hours.  CBG: No results for input(s): GLUCAP in the last 168 hours.  Radiological Exams on Admission: Dg Chest Port 1 View  05/09/2015  CLINICAL DATA:  Cough and congestion for 2-3 weeks EXAM: PORTABLE CHEST 1 VIEW COMPARISON:  04/26/2014 FINDINGS: Limited inspiratory effect. Scoliosis with Harrington rods. Enlarged cardiac silhouette. Hazy bilateral perihilar opacities left greater than right. Limited evaluation due to limited inspiratory effect. VP shunt tube noted on the right. Similar to numerous prior studies, there is large bowel gaseous distention. IMPRESSION: Limited study with limited inspiratory effect for this single AP view. Hazy perihilar opacities left greater than right likely largely due to atelectasis although pneumonia in the left perihilar area is not excluded. Electronically Signed   By: Skipper Cliche M.D.   On: 05/09/2015 12:15      Assessment/Plan   1. Aspiration pneumonia. She will be treated with intravenous clindamycin. 2. Severe cerebral palsy with mental retardation. 3. Quadriplegia. 4. Protein calorie severe malnutrition. PEG tube feeding. This can be continued.  She'll be admitted to the medical floor. Further recommendations will depend on patient's hospital progress.  Code Status: DO NOT RESUSCITATE.  This was confirmed with the mother at the bedside.  DVT Prophylaxis: Lovenox.  Family Communication: I discussed the plan with the mother at the bedside.   Disposition Plan: Home when medically stable.   Time spent: 60 minutes.  Doree Albee [facility] Pager [phone].

## 2015-05-10 DIAGNOSIS — G809 Cerebral palsy, unspecified: Secondary | ICD-10-CM

## 2015-05-10 DIAGNOSIS — J690 Pneumonitis due to inhalation of food and vomit: Principal | ICD-10-CM

## 2015-05-10 DIAGNOSIS — E43 Unspecified severe protein-calorie malnutrition: Secondary | ICD-10-CM

## 2015-05-10 LAB — COMPREHENSIVE METABOLIC PANEL
ALBUMIN: 3.7 g/dL (ref 3.5–5.0)
ALK PHOS: 125 U/L (ref 38–126)
ALT: 24 U/L (ref 14–54)
AST: 24 U/L (ref 15–41)
Anion gap: 8 (ref 5–15)
BUN: 10 mg/dL (ref 6–20)
CALCIUM: 9 mg/dL (ref 8.9–10.3)
CO2: 28 mmol/L (ref 22–32)
Chloride: 105 mmol/L (ref 101–111)
Creatinine, Ser: <0.3 mg/dL — ABNORMAL LOW (ref 0.44–1.00)
GLUCOSE: 76 mg/dL (ref 65–99)
Potassium: 4 mmol/L (ref 3.5–5.1)
SODIUM: 141 mmol/L (ref 135–145)
Total Bilirubin: 0.4 mg/dL (ref 0.3–1.2)
Total Protein: 6.8 g/dL (ref 6.5–8.1)

## 2015-05-10 LAB — CBC
HCT: 45.6 % (ref 36.0–46.0)
Hemoglobin: 14.9 g/dL (ref 12.0–15.0)
MCH: 33.6 pg (ref 26.0–34.0)
MCHC: 32.7 g/dL (ref 30.0–36.0)
MCV: 102.9 fL — ABNORMAL HIGH (ref 78.0–100.0)
PLATELETS: 193 10*3/uL (ref 150–400)
RBC: 4.43 MIL/uL (ref 3.87–5.11)
RDW: 13.1 % (ref 11.5–15.5)
WBC: 4.5 10*3/uL (ref 4.0–10.5)

## 2015-05-10 MED ORDER — CLINDAMYCIN HCL 300 MG PO CAPS: 300.0000 mg | ORAL_CAPSULE | Freq: Three times a day (TID) | ORAL | Status: DC

## 2015-05-10 NOTE — Discharge Summary (Signed)
Physician Discharge Summary  Renee [name] ZOX:[id number] DOB: August 09, 1985 DOA: 05/09/2015  PCP: Purvis Kilts, MD  Admit date: 05/09/2015 Discharge date: 05/10/2015  Time spent: 45 minutes  Recommendations for Outpatient Follow-up:  -Will be discharged home today. -Advised to follow up with PCP in 2 weeks. -Will DC on a 5 day course of clindamycin for presumed aspiration PNA. -Would benefit from a palliative care consult in the OP setting to address poor long-term prognosis and feasibility of re-hospitalization for recurrent aspiration PNA.  Discharge Diagnoses:  Active Problems:   Infantile cerebral palsy (HCC)   Congenital quadriplegia (HCC)   Aspiration pneumonia (HCC)   Protein-calorie malnutrition, severe ([place])   Discharge Condition: Stable  Filed Weights   05/09/15 1108 05/09/15 1630 05/09/15 1642  Weight: 25.401 kg (56 lb) 29.9 kg (65 lb 14.7 oz) 28.8 kg (63 lb 7.9 oz)    History of present illness:  This is a 29 year old lady who has severe cerebral palsy with mental retardation, hydrocodone as, seizure disorder who presents with cough, nasal congestion nausea and vomiting. She is fed by PEG tube. She has been treated as an outpatient by her primary care physician with Levaquin for double pneumonia but she has really not improved. The mother, who is at the bedside, denies any specific fevers. The mother tells me that the patient has had several episodes of aspiration before. She is now being admitted for further management.  Hospital Course:   Aspiration Pneumonia -Clinically improved. -Afebrile, no leukocytosis. -Will DC on 5 days of clindamycin.  Cerebral Palsy with Mental Retardation -At baseline. -Would be beneficial to have an OP palliative care consultation to discuss feasibility of repeated hospital admissions due to recurrent aspiration PNA given poor long-term prognosis.  Quadriplegia with Contractures -2/2 CP. -Continue baclofen and  tizanidine.  Severe Protein-Caloric Malnutrition -Continue PEG feedings.   Procedures:  None   Consultations:  None  Discharge Instructions  Discharge Instructions    Increase activity slowly    Complete by:  As directed             Medication List    STOP taking these medications        levofloxacin 500 MG tablet  Commonly known as:  LEVAQUIN      TAKE these medications        albuterol (2.5 MG/3ML) 0.083% nebulizer solution  Commonly known as:  PROVENTIL  USE 1 VIAL IN NEBULIZER FOUR TIMES DAILY AS DIRECTED. prn     baclofen 10 MG tablet  Commonly known as:  LIORESAL  Take 2 tablets (20mg ) at 0700. Take 1 tablet (10mg ) at 1100. Take 2 tablets (20mg ) at 1500. Take 2 tablets (20mg ) at 2000.     budesonide 0.25 MG/2ML nebulizer solution  Commonly known as:  PULMICORT  USE 1 VIAL PER NEBULIZER TWICE DAILY.     clindamycin 300 MG capsule  Commonly known as:  CLEOCIN  Take 1 capsule (300 mg total) by mouth 3 (three) times daily.     diazepam 1 MG/ML solution  Commonly known as:  VALIUM  Give 59ml every 8 hours as needed for agitation     feeding supplement (JEVITY 1.5 CAL/FIBER) Liqd  Place 237 mLs into feeding tube 4 (four) times daily. 1 can at 0700, 1100, 1500, and 2000.     HYDROcodone-acetaminophen 7.5-325 mg/15 ml solution  Commonly known as:  HYCET  Take 15 mLs by mouth 4 (four) times daily as needed for moderate pain.  medroxyPROGESTERone 150 MG/ML injection  Commonly known as:  DEPO-PROVERA  INJECT 1ML INTO THE MUSCLE EVERY 3 MONTHS.     polyethylene glycol powder powder  Commonly known as:  MIRALAX  Take 1/2 capful every day or every other day - via peg tube -  as needed for constipation     primidone 50 MG tablet  Commonly known as:  MYSOLINE  Give 4 tablets in the morning, 4 tablets in the afternoon and 5 tablets in the evening     ranitidine 150 MG/10ML syrup  Commonly known as:  ZANTAC  Place 20 mLs (300 mg total) into feeding tube  daily.     [name] 0.5 MG tablet  Commonly known as:  RISPERDAL  Give 2 tablets via G-tube at 7AM, 2 tablets at 11AM, 2 tablets at 3PM and 1 tablet via G-tube at bedtime     tiZANidine 2 MG tablet  Commonly known as:  ZANAFLEX  CRUSH 1 TABLET IN G-TUBE AT BEDTIME AS NEEDED FOR SPASMS.       Allergies  Allergen Reactions  . Other     Paper and adhesive tape - blisters  . Penicillins Rash    Has patient had a PCN reaction causing immediate rash, facial/tongue/throat swelling, SOB or lightheadedness with hypotension: NO Has patient had a PCN reaction causing severe rash involving mucus membranes or skin necrosis: No NO Has patient had a PCN reaction that required hospitalization No NO Has patient had a PCN reaction occurring within the last 10 years: No NO If all of the above answers are "NO", then may proceed with Cephalosporin use.   . Latex Rash and Other (See Comments)    blisters       Follow-up Information    Follow up with Purvis Kilts, MD. Schedule an appointment as soon as possible for a visit in 2 weeks.   Specialty:  Family Medicine   Contact information:   [address] [place]  [address] [phone]        The results of significant diagnostics from this hospitalization (including imaging, microbiology, ancillary and laboratory) are listed below for reference.    Significant Diagnostic Studies: Dg Chest Port 1 View  05/09/2015  CLINICAL DATA:  Cough and congestion for 2-3 weeks EXAM: PORTABLE CHEST 1 VIEW COMPARISON:  04/26/2014 FINDINGS: Limited inspiratory effect. Scoliosis with Harrington rods. Enlarged cardiac silhouette. Hazy bilateral perihilar opacities left greater than right. Limited evaluation due to limited inspiratory effect. VP shunt tube noted on the right. Similar to numerous prior studies, there is large bowel gaseous distention. IMPRESSION: Limited study with limited inspiratory effect for this single AP view. Hazy perihilar  opacities left greater than right likely largely due to atelectasis although pneumonia in the left perihilar area is not excluded. Electronically Signed   By: Skipper Cliche M.D.   On: 05/09/2015 12:15    Microbiology: No results found for this or any previous visit (from the past 240 hour(s)).   Labs: Basic Metabolic Panel:  Recent Labs Lab 05/09/15 1159 05/10/15 0530  NA 140 141  K 4.1 4.0  CL 103 105  CO2 29 28  GLUCOSE 70 76  BUN 10 10  CREATININE <0.30* <0.30*  CALCIUM 9.2 9.0   Liver Function Tests:  Recent Labs Lab 05/09/15 1159 05/10/15 0530  AST 30 24  ALT 26 24  ALKPHOS 158* 125  BILITOT 0.4 0.4  PROT 7.9 6.8  ALBUMIN 4.2 3.7   No results for input(s): LIPASE, AMYLASE in  the last 168 hours. No results for input(s): AMMONIA in the last 168 hours. CBC:  Recent Labs Lab 05/09/15 1159 05/10/15 0530  WBC 4.9 4.5  NEUTROABS 2.6  --   HGB 17.0* 14.9  HCT 51.0* 45.6  MCV 103.9* 102.9*  PLT 200 193   Cardiac Enzymes: No results for input(s): CKTOTAL, CKMB, CKMBINDEX, TROPONINI in the last 168 hours. BNP: BNP (last 3 results) No results for input(s): BNP in the last 8760 hours.  ProBNP (last 3 results) No results for input(s): PROBNP in the last 8760 hours.  CBG: No results for input(s): GLUCAP in the last 168 hours.     SignedLelon Frohlich  [facility] Pager: [phone] 05/10/2015, 8:52 AM

## 2015-05-10 NOTE — Progress Notes (Signed)
Prior to d/c, peri care was performed. Breathing treatment administered by RT. IV removed, site WNL.  Patient's mother given d/c instructions and new prescription.  Discussed all home medications (when to take again, how, and why to take), patient verbalizes understanding. Discussed home care with patient, teachback completed. F/U appointments in place, pt's mother states they will keep appointment. Pt is stable at this time. Pt taken to main entrance in wheelchair by family.

## 2015-05-10 NOTE — Care Management Note (Signed)
Case Management Note  Patient Details  Name: Renee [name] MRN: [id number] Date of Birth: 15-Aug-1985  Expected Discharge Date:   05/10/2015               Expected Discharge Plan:  [name]  In-House Referral:  NA  Discharge planning Services  CM Consult  Post Acute Care Choice:  Resumption of Svcs/PTA Provider Choice offered to:  Parent  DME Arranged:    DME Agency:     HH Arranged:  RN [place] Agency:  [place]  Status of Service:  Completed, signed off  Medicare Important Message Given:    Date Medicare IM Given:    Medicare IM give by:    Date Additional Medicare IM Given:    Additional Medicare Important Message give by:     If discussed at [place] of Stay Meetings, dates discussed:    Additional Comments: Admitted with asp pneumonia. Pt is from home, lives with mother. Pt has cereberal palsy and requires 24/7 care. Pt has aid 7 days/week through [employer] and RN though [place]. Pt discharging home today with [facility] RN services through [place]. Jeannette How, of [place], made aware and will obtain pt info from chart. Per pt's mother she has all necessary DME and has no needs at DC. Pt's mother aware Alvis Lemmings has 48 hours to initiate services at DC. Pt's mother contacted via phone, she will pick pt up later today. No further CM needs noted at this time.  Sherald Barge, RN 05/10/2015, 1:47 PM

## 2015-05-18 ENCOUNTER — Telehealth: Payer: Self-pay | Admitting: Internal Medicine

## 2015-05-18 MED ORDER — RANITIDINE HCL 150 MG/10ML PO SYRP: 300.0000 mg | ORAL_SOLUTION | Freq: Two times a day (BID) | ORAL | Status: DC

## 2015-05-18 NOTE — Telephone Encounter (Signed)
She can try to increase to BID to see if this helps. Unclear why it is only with afternoon feedings. I have not met this patient before. If symptoms persist following medication change she can see me or one of the PAs in clinic. Thanks

## 2015-05-18 NOTE — Telephone Encounter (Signed)
Spoke with patient's mother and she states the patient is having problems taking her afternoon feeding. She coughs and acts like she did prior to starting the Zantac daily. She is wondering if she needs it BID. Please, advise.

## 2015-05-18 NOTE — Telephone Encounter (Signed)
Spoke with patient's mother and gave her recommendations. Rx sent to pharmacy.

## 2015-05-27 ENCOUNTER — Other Ambulatory Visit: Payer: Self-pay | Admitting: Pulmonary Disease

## 2015-05-30 ENCOUNTER — Encounter (HOSPITAL_COMMUNITY): Payer: Self-pay | Admitting: Emergency Medicine

## 2015-05-30 ENCOUNTER — Encounter: Payer: Self-pay | Admitting: *Deleted

## 2015-05-30 ENCOUNTER — Encounter: Payer: Self-pay | Admitting: Adult Health

## 2015-05-30 ENCOUNTER — Emergency Department (HOSPITAL_COMMUNITY)
Admission: EM | Admit: 2015-05-30 | Discharge: 2015-05-31 | Disposition: A | Discharge status: Home / Self Care | Payer: 59 | Attending: Emergency Medicine | Admitting: Emergency Medicine

## 2015-05-30 ENCOUNTER — Ambulatory Visit: Payer: 59

## 2015-05-30 ENCOUNTER — Ambulatory Visit (INDEPENDENT_AMBULATORY_CARE_PROVIDER_SITE_OTHER): Payer: 59 | Admitting: Adult Health

## 2015-05-30 ENCOUNTER — Ambulatory Visit (INDEPENDENT_AMBULATORY_CARE_PROVIDER_SITE_OTHER): Payer: 59 | Admitting: *Deleted

## 2015-05-30 VITALS — BP 110/70 | HR 104 | Ht <= 58 in | Wt <= 1120 oz

## 2015-05-30 DIAGNOSIS — N926 Irregular menstruation, unspecified: Secondary | ICD-10-CM

## 2015-05-30 DIAGNOSIS — Z79899 Other long term (current) drug therapy: Secondary | ICD-10-CM | POA: Diagnosis not present

## 2015-05-30 DIAGNOSIS — T85598A Other mechanical complication of other gastrointestinal prosthetic devices, implants and grafts, initial encounter: Secondary | ICD-10-CM

## 2015-05-30 DIAGNOSIS — M419 Scoliosis, unspecified: Secondary | ICD-10-CM | POA: Diagnosis not present

## 2015-05-30 DIAGNOSIS — J690 Pneumonitis due to inhalation of food and vomit: Secondary | ICD-10-CM

## 2015-05-30 DIAGNOSIS — K9423 Gastrostomy malfunction: Secondary | ICD-10-CM | POA: Insufficient documentation

## 2015-05-30 DIAGNOSIS — Z8659 Personal history of other mental and behavioral disorders: Secondary | ICD-10-CM | POA: Diagnosis not present

## 2015-05-30 DIAGNOSIS — G40909 Epilepsy, unspecified, not intractable, without status epilepticus: Secondary | ICD-10-CM | POA: Diagnosis not present

## 2015-05-30 DIAGNOSIS — J4530 Mild persistent asthma, uncomplicated: Secondary | ICD-10-CM | POA: Diagnosis not present

## 2015-05-30 DIAGNOSIS — Z880 Allergy status to penicillin: Secondary | ICD-10-CM | POA: Diagnosis not present

## 2015-05-30 DIAGNOSIS — Z91040 Latex allergy status: Secondary | ICD-10-CM | POA: Insufficient documentation

## 2015-05-30 DIAGNOSIS — Z87440 Personal history of urinary (tract) infections: Secondary | ICD-10-CM | POA: Diagnosis not present

## 2015-05-30 DIAGNOSIS — J45909 Unspecified asthma, uncomplicated: Secondary | ICD-10-CM | POA: Diagnosis not present

## 2015-05-30 MED ORDER — MEDROXYPROGESTERONE ACETATE 150 MG/ML IM SUSP
150.0000 mg | Freq: Once | INTRAMUSCULAR | Status: AC
  Administered 2015-05-30: 150 mg via INTRAMUSCULAR

## 2015-05-30 MED ORDER — ALBUTEROL SULFATE (2.5 MG/3ML) 0.083% IN NEBU
2.5000 mg | INHALATION_SOLUTION | Freq: Once | RESPIRATORY_TRACT | Status: AC
  Administered 2015-05-30: 2.5 mg via RESPIRATORY_TRACT
  Filled 2015-05-30: qty 3

## 2015-05-30 NOTE — Progress Notes (Signed)
Patient ID: Renee [name], female   DOB: 05-13-1986, 29 y.o.   MRN: [id number] Depo Provera 150 mg IM injection given in right thigh, with no complications. No UPT done pt has CP and is wheelchair bound and not sexually active.  Pt getting the Depo Provera injection for period management.

## 2015-05-30 NOTE — Progress Notes (Signed)
   Subjective:    Patient ID: Renee [name], female    DOB: 1986/06/05, 29 y.o.   MRN: [id number]  HPI   Patient ID: Renee [name], female    DOB: 12-05-1985, 29 y.o.   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  She is DNR/DNI. Has tendency toward aspiration PNA .    05/30/2015 Post Hospital follow up  Patient returns accompanied by her mom and aide.  She was admitted 10/17 -10/18 for presumed aspiration pna.  Fariha was 6 weeks ago with possible bronchitis , tx w/ Levaquin.  Says she did get better but vomited the day prior to admission w/ worsening of  Respiratory status. She was tx w/ abx and discharged on clindamycin .  CXR showed BB atx L>R and ? Left perihilar density .  She was suppose to be on abx for 5 additional days but was given multiple capsules  She took 2 weeks of abx. This was stopped last week week by PCP .  She is better but remains congested. We discussed chest PT and frequent position changes.   Denies known fever, hemoptysis, diarrhea, or bloody stools.   She does bolus TF feeds. No high residuals .   Discussed her nebs treatment, mom is not sure pulmicort helps. We discussed her regimen and how the nebs work .   Review of Systems    Constitutional:   No  weight loss, night sweats,   Fevers, chills, fatigue, or  lassitude.  HEENT:   No headaches,  Difficulty swallowing,  Tooth/dental problems, or  Sore throat,                No sneezing, itching, ear ache, nasal congestion, post nasal drip,   CV:  No chest pain,  Orthopnea, PND, swelling in lower extremities, anasarca, dizziness, palpitations, syncope.   GI  No heartburn, indigestion, abdominal pain, nausea, vomiting, diarrhea, change in bowel habits, loss of appetite, bloody stools.   Resp:    No chest wall deformity  Skin: no rash or lesions.  GU: no dysuria, change in color of urine, no urgency or frequency.  No flank pain, no hematuria    MS:  No joint pain or swelling.  No decreased range of motion.  No back pain.  Psych:  No change in mood or affect. No depression or anxiety.  No memory loss.     Objective:   Physical Exam  General - Thin contracted female  , appears fussy  HEENT -no oral exudate Cardiac - s1s2 regular, no murmur  Chest - Coarse Rhonchi bilaterally  Abdomen - soft, PEG tube in place Extremities - no edema, contracted Neurologic - awake, non-verbal, irritable        Assessment & Plan:

## 2015-05-30 NOTE — ED Notes (Signed)
Mother states feeding tube has come dislodged and she hasn't been able to replace it. No redness, or drainage around site.

## 2015-05-30 NOTE — ED Provider Notes (Signed)
CSN: [id number]     Arrival date & time 05/30/15  1843 History   First MD Initiated Contact with Patient 05/30/15 2108     No chief complaint on file.  HPI  Renee [name] is a 29 year old female with PMHx of cerebral palsy presenting with feeding tube dislodgment. Patient's mother is at bedside and reports that her feeding tube came out today. She attempted to reinsert it but the balloon would deflate within minutes and the tube would fall back out. Her mother reports that she usually has extra feeding tubes at home for replacement but she has run out this week. She uses 14 fr peg tubes. She called her medical supply company who told her that they could not get her new feeding tube for a few more days. She reports that her daughter is at baseline mental status. No signs of infection at the feeding tube site. No erythema or drainage at her feeding tube site. No recent fevers, congestion, rhinorrhea, cough, vomiting or rash.  Past Medical History  Diagnosis Date  . Cerebral palsy ([place])   . Mental retardation   . Hydrocephalus   . Seizure disorder ([place])   . Osteopenia   . Hip dysplasia     bilateral  . DNR (do not resuscitate)   . DNI (do not intubate)   . Scoliosis   . Asthma     breathing treatment  . DNR (do not resuscitate) 04/28/2014  . UTI (urinary tract infection)    Past Surgical History  Procedure Laterality Date  . Spinal fusion  2005  . 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 Laparoscopic nissen fundoplication    . Gastrostomy tube placement      at same time as nissen  . Tympanostomy tube placement    . Ileopsoas release Bilateral     Dr Susy Frizzle  . Tenotomy adductor / hamstring closed Bilateral 04/25/00   Family History  Problem Relation Age of Onset  . Breast cancer Maternal Grandmother   . Heart disease Maternal Grandfather   . Thyroid disease Maternal Grandfather   . Thyroid disease Maternal Aunt    Social History  Substance Use Topics  . Smoking status: Never  Smoker   . Smokeless tobacco: Never Used  . Alcohol Use: No   OB History    No data available     Review of Systems  Constitutional: Negative for fever and chills.  HENT: Negative for congestion and rhinorrhea.   Eyes: Negative for discharge.  Respiratory: Negative for cough.   Gastrointestinal: Negative for vomiting.  Genitourinary: Negative for hematuria and decreased urine volume.  Skin: Negative for rash.  Neurological: Negative for seizures and syncope.  All other systems reviewed and are negative.     Allergies  Other; Penicillins; and Latex  Home Medications   Prior to Admission medications   Medication Sig Start Date End Date Taking? Authorizing Provider  albuterol (PROVENTIL) (2.5 MG/3ML) 0.083% nebulizer solution USE 1 VIAL IN NEBULIZER FOUR TIMES DAILY AS DIRECTED. 05/27/15  Yes [name], MD  baclofen (LIORESAL) 10 MG tablet Take 2 tablets (20mg ) at 0700. Take 1 tablet (10mg ) at 1100. Take 2 tablets (20mg ) at 1500. Take 2 tablets (20mg ) at 2000. 02/07/15  Yes Rockwell Germany, NP  budesonide (PULMICORT) 0.25 MG/2ML nebulizer solution USE 1 VIAL PER NEBULIZER TWICE DAILY. 04/20/15  Yes [name], MD  diazepam (VALIUM) 1 MG/ML solution Give 34ml every 8 hours as needed for agitation 03/02/15  Yes Rockwell Germany, NP  medroxyPROGESTERone (DEPO-PROVERA) 150 MG/ML injection INJECT 1ML INTO THE MUSCLE EVERY 3 MONTHS. 12/06/14  Yes Estill Dooms, NP  Nutritional Supplements (FEEDING SUPPLEMENT, JEVITY 1.5 CAL/FIBER,) LIQD Place 237 mLs into feeding tube 4 (four) times daily. 1 can at 0700, 1100, 1500, and 2000.   Yes Historical Provider, MD  polyethylene glycol powder (MIRALAX) powder Take 1/2 capful every day or every other day - via peg tube -  as needed for constipation 01/25/15  Yes Lafayette Dragon, MD  ranitidine (ZANTAC) 150 MG/10ML syrup Place 20 mLs (300 mg total) into feeding tube 2 (two) times daily. 05/18/15  Yes Manus Gunning, MD  [name] (RISPERDAL)  0.5 MG tablet Give 2 tablets via G-tube at 7AM, 2 tablets at 11AM, 2 tablets at [facility] and 1 tablet via G-tube at bedtime 02/08/15  Yes Rockwell Germany, NP  tiZANidine (ZANAFLEX) 2 MG tablet CRUSH 1 TABLET IN G-TUBE AT BEDTIME AS NEEDED FOR SPASMS. 01/25/15  Yes Rockwell Germany, NP  clindamycin (CLEOCIN) 300 MG capsule Take 1 capsule (300 mg total) by mouth 3 (three) times daily. Patient not taking: Reported on 05/30/2015 05/10/15   Erline Hau, MD  HYDROcodone-acetaminophen (HYCET) 7.5-325 mg/15 ml solution Take 15 mLs by mouth 4 (four) times daily as needed for moderate pain. Patient not taking: Reported on 05/30/2015 03/02/15 03/01/16  Jola Schmidt, MD  primidone (MYSOLINE) 50 MG tablet TAKE 4 TABLETS EVERY MORNING,4 TABLETS IN THE AFTERNOON, AND 4 TABLETS IN THE EVENING. 05/31/15   Rockwell Germany, NP   BP 107/80 mmHg  Pulse 87  Temp(Src) 97.5 F (36.4 C) (Axillary)  Resp 18  SpO2 97% Physical Exam  Constitutional: She appears well-developed and well-nourished. No distress.  Pt is at baseline per mother  HENT:  Head: Normocephalic and atraumatic.  Moist mucus membranes  Eyes: Conjunctivae are normal. Right eye exhibits no discharge. Left eye exhibits no discharge. No scleral icterus.  Neck: Normal range of motion. Neck supple.  Cardiovascular: Normal rate and regular rhythm.   Pulmonary/Chest: Effort normal and breath sounds normal. No respiratory distress. She has no wheezes.  Abdominal: Soft. She exhibits no distension. There is no tenderness.  Peg tube in place with bandaid. Tube immediately falls out with removal of bandaid. Balloon appears to be punctured and will not hold water. Tube site without erythema or purulent drainage.   Musculoskeletal: Normal range of motion.  Severely contracted limbs with atrophy  Lymphadenopathy:    She has no cervical adenopathy.  Neurological: She is alert.  Skin: Skin is warm and dry. No rash noted.  Psychiatric: She has a normal mood  and affect. Her behavior is normal.  Nursing note and vitals reviewed.   ED Course  Procedures (including critical care time) Labs Review Labs Reviewed - No data to display  Imaging Review No results found. I have personally reviewed and evaluated these images and lab results as part of my medical decision-making.   EKG Interpretation None      MDM   Final diagnoses:  Feeding tube dysfunction, initial encounter   Patient presenting with feeding tube dislodgment. No other complaints at this time. No signs of infection from her tube site. Her peg tube appears to have a faulty balloon. A 14 fr foley catheter was inserted temporarily and pt will follow up with her PCP tomorrow morning. Pt's mother is at bedside and agreeable with this plan. Pt stable for discharge.     Josephina Gip, PA-C 05/31/15  [place], MD 06/03/15 [phone]

## 2015-05-30 NOTE — ED Notes (Signed)
Mom states daughter was on antibiotics for 2 weeks.

## 2015-05-30 NOTE — Discharge Instructions (Signed)
-   Call [name]'s PCP tomorrow morning to schedule follow up appointment - Return to ED with signs of infection at the tube site (redness, drainage), fevers, or other new symptoms

## 2015-05-30 NOTE — Progress Notes (Signed)
Reviewed and agree with assessment/plan. 

## 2015-05-30 NOTE — Assessment & Plan Note (Signed)
Clinically she is improving , she is at high risk of recurrent aspiration PNA  Cont w/ current regimen.  Encouraged on chest PT .   Plan  Continue with chest PT  Continue on Pulmicort Neb Twice daily   Continue on Albuterol Neb every 4hr as needed .  Follow up Dr. Halford Chessman  3 months .

## 2015-05-30 NOTE — Assessment & Plan Note (Signed)
Cont on current regimen  

## 2015-05-30 NOTE — Patient Instructions (Signed)
Continue with chest PT  Continue on Pulmicort Neb Twice daily   Continue on Albuterol Neb every 4hr as needed .  Follow up Dr. Halford Chessman  3 months .

## 2015-05-31 ENCOUNTER — Other Ambulatory Visit: Payer: Self-pay | Admitting: Family

## 2015-06-01 ENCOUNTER — Other Ambulatory Visit: Payer: Self-pay | Admitting: Gastroenterology

## 2015-06-01 ENCOUNTER — Other Ambulatory Visit: Payer: Self-pay | Admitting: *Deleted

## 2015-06-01 ENCOUNTER — Telehealth: Payer: Self-pay | Admitting: Gastroenterology

## 2015-06-01 ENCOUNTER — Ambulatory Visit (HOSPITAL_COMMUNITY)
Admission: RE | Admit: 2015-06-01 | Discharge: 2015-06-01 | Disposition: A | Discharge status: Home / Self Care | Payer: 59 | Source: Ambulatory Visit | Attending: Gastroenterology | Admitting: Gastroenterology

## 2015-06-01 DIAGNOSIS — R633 Feeding difficulties, unspecified: Secondary | ICD-10-CM

## 2015-06-01 DIAGNOSIS — Z431 Encounter for attention to gastrostomy: Secondary | ICD-10-CM | POA: Insufficient documentation

## 2015-06-01 MED ORDER — IOHEXOL 300 MG/ML  SOLN
50.0000 mL | Freq: Once | INTRAMUSCULAR | Status: DC | PRN
  Administered 2015-06-01: 20 mL
  Filled 2015-06-01: qty 50

## 2015-06-01 NOTE — Telephone Encounter (Signed)
Renee [name] can you please coordinate to have this replaced by IR and place a consult. This is not something I will be able to take care of in the near future given my schedule today. Hopefully they can place it today or tomorrow if possible? The foley is not to be used for feedings. Thanks

## 2015-06-01 NOTE — Telephone Encounter (Signed)
Spoke with patient's mother and she states she had to take patient to ED at [facility] because her feeding tube fell out. She usually has extra tubes on hand to replace it but she is out. Renee [name] did not have a tube either. They placed a 14 French foley to keep site open. She is unable to get a replacement tube until next week. Patient is on tube feedings for nutrition. Takes nothing by mouth. Her mother wants to know if the foley is ok to use for the next week until she get a new tube. Please, advise.

## 2015-06-01 NOTE — Telephone Encounter (Signed)
Spoke with Malachy Mood in [facility] and scheduled patient at 2:00 PM today. NPO until procedure. Patient's mother aware.

## 2015-06-01 NOTE — Telephone Encounter (Signed)
patient mother Stanton Kidney states that patient had to go to the ED monday night because her feeding tube came out. They put in a temporary catheter and patient mom is wanting to know if this will be ok until her new tube comes in. Stanton Kidney states that it will be another week before new tube comes in.

## 2015-06-01 NOTE — Telephone Encounter (Signed)
Spoke with Renee [name] and gave her order to hold all feeding and medications until after feeding tube replaced at 2:00 PM at [facility].

## 2015-06-03 ENCOUNTER — Ambulatory Visit (HOSPITAL_COMMUNITY)
Admission: RE | Admit: 2015-06-03 | Discharge: 2015-06-03 | Disposition: A | Discharge status: Home / Self Care | Payer: 59 | Source: Ambulatory Visit | Attending: Interventional Radiology | Admitting: Interventional Radiology

## 2015-06-03 ENCOUNTER — Other Ambulatory Visit (HOSPITAL_COMMUNITY): Payer: Self-pay | Admitting: Interventional Radiology

## 2015-06-03 DIAGNOSIS — K9423 Gastrostomy malfunction: Secondary | ICD-10-CM

## 2015-06-03 DIAGNOSIS — Z431 Encounter for attention to gastrostomy: Secondary | ICD-10-CM | POA: Insufficient documentation

## 2015-06-03 DIAGNOSIS — G809 Cerebral palsy, unspecified: Secondary | ICD-10-CM | POA: Diagnosis not present

## 2015-06-03 DIAGNOSIS — F79 Unspecified intellectual disabilities: Secondary | ICD-10-CM | POA: Insufficient documentation

## 2015-06-03 MED ORDER — IOHEXOL 300 MG/ML  SOLN
50.0000 mL | Freq: Once | INTRAMUSCULAR | Status: DC | PRN
  Administered 2015-06-03: 10 mL
  Filled 2015-06-03: qty 50

## 2015-06-03 NOTE — Procedures (Signed)
Successful 14 fr button Gtube replacement Confirmed in stomach No comp Stable Ready for use

## 2015-06-20 ENCOUNTER — Ambulatory Visit (INDEPENDENT_AMBULATORY_CARE_PROVIDER_SITE_OTHER): Payer: 59 | Admitting: Family

## 2015-06-20 ENCOUNTER — Encounter: Payer: Self-pay | Admitting: Family

## 2015-06-20 ENCOUNTER — Encounter: Payer: Self-pay | Admitting: *Deleted

## 2015-06-20 VITALS — BP 100/60 | HR 90 | Wt <= 1120 oz

## 2015-06-20 DIAGNOSIS — F72 Severe intellectual disabilities: Secondary | ICD-10-CM | POA: Diagnosis not present

## 2015-06-20 DIAGNOSIS — G40319 Generalized idiopathic epilepsy and epileptic syndromes, intractable, without status epilepticus: Secondary | ICD-10-CM | POA: Diagnosis not present

## 2015-06-20 DIAGNOSIS — G910 Communicating hydrocephalus: Secondary | ICD-10-CM | POA: Diagnosis not present

## 2015-06-20 DIAGNOSIS — Q043 Other reduction deformities of brain: Secondary | ICD-10-CM

## 2015-06-20 DIAGNOSIS — R454 Irritability and anger: Secondary | ICD-10-CM

## 2015-06-20 DIAGNOSIS — G808 Other cerebral palsy: Secondary | ICD-10-CM

## 2015-06-20 DIAGNOSIS — G253 Myoclonus: Secondary | ICD-10-CM

## 2015-06-20 MED ORDER — ALPRAZOLAM 0.25 MG PO TABS: ORAL_TABLET | ORAL | Status: AC

## 2015-06-20 MED ORDER — DIAZEPAM 1 MG/ML PO SOLN: ORAL | Status: DC

## 2015-06-20 MED ORDER — TIZANIDINE HCL 2 MG PO TABS: ORAL_TABLET | ORAL | Status: DC

## 2015-06-20 NOTE — Progress Notes (Signed)
Patient: Renee [name] MRN: [id number] Sex: female DOB: August 04, 1985  Provider: Rockwell Germany, NP Location of Care: [facility]  Note type: Routine return visit  History of Present Illness: Referral Source: Renee Chessman, MD- Seizures History from: mother and home health aid and [facility] chart Chief Complaint: Seizures/Congential Quadriplegia  Renee [name] is a 29 y.o. 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 Renee [name] has minor motor seizures that typically occur a few times per week. 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 The patient is medically fragile, wheelchair bound and requires total care. She requires nebulizer treatments and intermittent oxygen at home. She has difficulties managing respiratory secretions and sees a pulmonologist regularly. Renee [name] was last seen Dec 16, 2014.   Mom reports today that Renee [name] was hospitalized briefly in October for pneumonia. She has [name] to have a cough since then, and has an upcoming appointment with her pulmonologist. Mom notes that sometimes Renee [name] will have increase in myoclonus, and sometimes increase in seizures. Mom gives her an occasional extra Mysoline and the behaviors improve.  Renee [name] has frequent episodes of agitation, irritability and crying. Tizanidine helped but Mom notes that in the last few months, Renee [name] has increased muscle spasticity at night. [name] also helped with agitation, but Mom occasionally has to give her Diazepam 3mg  when the irritability is prolonged. Mom reported today that on some days, even giving Diazepam does not help. On two occasions recently, when all comfort measures had been tried, Diazepam had been given and Renee [name] to scream for hours, Mom gave her 1/4 tablet of her own supply of Xanax. She  said that Renee [name] calmed and stopped screaming. She remained awake and alert after the Xanax had been given.  Renee [name]'s mother has no other health concerns for Renee [name] today other than previously mentioned.  Review of Systems: Please see the HPI for neurologic and other pertinent review of systems. Otherwise, the following systems are noncontributory including constitutional, eyes, ears, nose and throat, cardiovascular, respiratory, gastrointestinal, genitourinary, musculoskeletal, skin, endocrine, hematologic/lymph, allergic/immunologic and psychiatric.   Past Medical History  Diagnosis Date  . Cerebral palsy ([place])   . Mental retardation   . Hydrocephalus   . Seizure disorder ([place])   . Osteopenia   . Hip dysplasia     bilateral  . DNR (do not resuscitate)   . DNI (do not intubate)   . Scoliosis   . Asthma     breathing treatment  . DNR (do not resuscitate) 04/28/2014  . UTI (urinary tract infection)    Hospitalizations: Yes.  , Head Injury: No., Nervous System Infections: No., Immunizations up to date: Yes.   Past Medical History Comments: See history  Surgical History Past Surgical History  Procedure Laterality Date  . Spinal fusion  2005  . 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 Laparoscopic nissen fundoplication    . Gastrostomy tube placement      at same time as nissen  . Tympanostomy tube placement    . Ileopsoas release Bilateral     Dr Susy Frizzle  . Tenotomy adductor / hamstring closed Bilateral 04/25/00    Family History family history includes Breast cancer in her maternal grandmother; Heart disease in her maternal grandfather; Thyroid disease in her maternal aunt and maternal grandfather. Family History is otherwise negative for  migraines, seizures, cognitive impairment, blindness, deafness, birth defects, chromosomal disorder, autism.  Social History Social History   Social History  . Marital Status: Single    Spouse Name: N/A  . Number of Children: 0  . Years of  Education: N/A   Occupational History  . disabled    Social History Main Topics  . Smoking status: Never Smoker   . Smokeless tobacco: Never Used  . Alcohol Use: No  . Drug Use: No  . Sexual Activity: No   Other Topics Concern  . None   Social History Narrative   Renee [name] requires total care in all aspects of daily living. She lives with her mother, who receives some help from a home health aide for Renee [name]. Renee [name] enjoys watching TV.                      Allergies Allergies  Allergen Reactions  . Other     Paper and adhesive tape - blisters  . Penicillins Rash    Has patient had a PCN reaction causing immediate rash, facial/tongue/throat swelling, SOB or lightheadedness with hypotension: NO Has patient had a PCN reaction causing severe rash involving mucus membranes or skin necrosis: NO Has patient had a PCN reaction that required hospitalization: NO Has patient had a PCN reaction occurring within the last 10 years: NO If all of the above answers are "NO", then may proceed with Cephalosporin use.   . Latex Rash and Other (See Comments)    blisters    Physical Exam BP 100/60 mmHg  Pulse 90  Wt 58 lb (26.309 kg) General: thin female with increased tone, contractures and wasting of her arms and legs, lying on the exam table  Head: oxycephaly, cranial encephalocele, mid-face hypoplasia  Neck: supple with no carotid or supraclavicular bruits.  Respiratory: lungs clear to auscultation  Cardiovascular: regular rate and rhythm, no murmurs  Musculoskeletal: increased tone, fixed contractures and atrophy of her limbs, with some sparing of the right arm.  Skin: She has callouses on her some of her fingers from chewing on them. She has mild redness on her bony prominences but no areas of broken skin.  Neurologic Exam  Mental Status: She has severe mental retardation. She has no language. 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 She was agitated and irritable today. She  calmed slightly when she was moved from the exam table to her chair. Cranial Nerves: She has dysconjugate roving eye movements. I could not illicit a red reflex. She occasionally turned to localize sounds but I could not get her to do so consistently. She has lower facial weakness with drooling. She does not protrude the tongue but it appears to be midline.  Motor: Increased tone and rigidity in all extremities. There is some sparing of the right arm, which can be almost fully extended. She has fixed contractures and atrophy in the left arm and both lower extremities. She has tight heel cords. She occasionally rubs her mouth and her head with her right hand.  Sensory: Withdrawal x 4.  Coordination: Unable to follow instructions to assess coordination  Gait and Station: Wheelchair bound  Reflexes: 1+ in the right upper extremity and absent in the other extremities. I could not illicit clonus.   Impression 1. 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 2. Minor motor seizures 3. Significant intellectual delay 4. Spastic quadriparesis 5. Severe dysphagia 6. Decreased visual acuity 7.  Agitation and irritability  Recommendations for plan of care The [facility] were reviewed. Zahra has neither had nor required imaging or lab studies since the last visit. 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 Terryn has episodes of myoclonus as well as minor motor seizures that intermittently increase in frequency. Mom has been giving her an occasional extra tablet of Mysoline when that occurs and it seems to reduce the behaviors. I asked Mom  to monitor how frequently that was occuring and let me know, as we may need to increase the daily Mysoline dose.  Ebbie has also been increasingly agitated and irritable. Her mother feels that her spasticity is worse at night and worries that she is experiencing pain. I recommended increasing the Tizanidine dose by 1/2 tablet to help with that. Aireona receives daily [name] and Diazepam as needed when she cannot be calmed by repositioning or other comfort measures. Mom has given her Xanax 0.25mg  - 1/4 tablet when screaming is prolonged and that has helped Alanni to be calmer, with no side effects. I explained to Mom that it would not be in Sarahjane's best interest to give that to her daily, but that she could give it to her as she has been doing for severe and prolonged agitation. I gave Mom a prescription for it and asked her to use it sparingly. Mom is a strong advocate for Jailene and is devoted to her care. I will see Harly back in follow up in 6 months or sooner if needed.   The medication list was reviewed and reconciled.  I reviewed changes that were made in the prescribed medications today.  A complete medication list was provided to the patient's mother.  Dr. Gaynell Face was consulted regarding the patient.   Total time spent with the patient was 30 minutes, of which 50% or more was spent in counseling and coordination of care.

## 2015-06-20 NOTE — Patient Instructions (Signed)
For Ltanya's spasms at night, increase the Tizanidine to 1 and 1/2 tablets at bedtime. Let me know if this is too sedating for her. I have updated her prescription for this amount.  I have updated her prescription for Diazepam (Valium) to be 21mls when needed for agitation.   I have added Alprazolam (Xanax) for severe irritability. Try to give it sparingly as we discussed.   Monitor how often you are giving an extra tablet of Mysoline. If it is more often than once per week, let me know so that I can adjust her prescription.   Please plan to return for follow up in 6 months or sooner if needed.

## 2015-06-24 ENCOUNTER — Other Ambulatory Visit: Payer: Self-pay | Admitting: Family

## 2015-06-24 ENCOUNTER — Other Ambulatory Visit: Payer: Self-pay | Admitting: Pulmonary Disease

## 2015-07-02 ENCOUNTER — Other Ambulatory Visit: Payer: Self-pay | Admitting: Family

## 2015-07-06 ENCOUNTER — Telehealth: Payer: Self-pay | Admitting: *Deleted

## 2015-07-06 DIAGNOSIS — G808 Other cerebral palsy: Secondary | ICD-10-CM

## 2015-07-06 MED ORDER — TIZANIDINE HCL 2 MG PO TABS: ORAL_TABLET | ORAL | Status: DC

## 2015-07-06 NOTE — Telephone Encounter (Signed)
[name], patient's mother, called and states that when they were in our office on 11/28 Tizanidine was changed but the new prescription was not updated to 1and a 1/2 tablets at bedtime. Mom reports that they do not have enough to get them through the month. Can you please send a new prescription for this to [facility] on file? Thank you.  CB:[phone]

## 2015-07-06 NOTE — Telephone Encounter (Signed)
I updated Rx and sent to [facility] electronically. I called and left a message to let Mom know. TG

## 2015-07-29 ENCOUNTER — Other Ambulatory Visit: Payer: Self-pay | Admitting: Pulmonary Disease

## 2015-08-22 ENCOUNTER — Encounter: Payer: Self-pay | Admitting: *Deleted

## 2015-08-22 ENCOUNTER — Ambulatory Visit (INDEPENDENT_AMBULATORY_CARE_PROVIDER_SITE_OTHER): Payer: 59 | Admitting: *Deleted

## 2015-08-22 DIAGNOSIS — N926 Irregular menstruation, unspecified: Secondary | ICD-10-CM | POA: Diagnosis not present

## 2015-08-22 MED ORDER — MEDROXYPROGESTERONE ACETATE 150 MG/ML IM SUSP
150.0000 mg | Freq: Once | INTRAMUSCULAR | Status: AC
  Administered 2015-08-22: 150 mg via INTRAMUSCULAR

## 2015-08-22 NOTE — Progress Notes (Signed)
Patient ID: Renee [name], female   DOB: [birth year], 30 y.o.   MRN: [id number] Depo Provera 150 mg IM given left thigh with no complications, no UPT done. Pt has CP and is wheelchair bound, not sexually active. Pt getting Depo Provera for Period Management.

## 2015-09-02 ENCOUNTER — Other Ambulatory Visit: Payer: Self-pay | Admitting: *Deleted

## 2015-09-02 DIAGNOSIS — F72 Severe intellectual disabilities: Secondary | ICD-10-CM

## 2015-09-02 DIAGNOSIS — R454 Irritability and anger: Secondary | ICD-10-CM

## 2015-09-05 ENCOUNTER — Telehealth: Payer: Self-pay | Admitting: Family

## 2015-09-05 MED ORDER — RISPERIDONE 0.5 MG PO TABS: ORAL_TABLET | ORAL | Status: DC

## 2015-09-05 NOTE — Telephone Encounter (Signed)
[name] left a message saying that she was Renee [name]'s home care nurse from [facility]. She asked for call back on Weds 09/07/15 between 7AM and 1PM at [phone]. TG

## 2015-09-07 NOTE — Telephone Encounter (Signed)
Noted, thank you

## 2015-09-07 NOTE — Telephone Encounter (Signed)
I called Renee [name] back today as requested. She asked to review her medication list, which I did, and asked for verbal clarification of orders for water to be given by GT after meds and feedings. Renee [name] should receive up to 20 ml water flush for her g-tube after med administration and up to 95ml water flush for her g-tube after feedings. Renee [name] had no further questions. TG

## 2015-09-09 ENCOUNTER — Encounter: Payer: Self-pay | Admitting: Pulmonary Disease

## 2015-09-09 ENCOUNTER — Ambulatory Visit (INDEPENDENT_AMBULATORY_CARE_PROVIDER_SITE_OTHER): Payer: 59 | Admitting: Pulmonary Disease

## 2015-09-09 VITALS — BP 108/76 | HR 112 | Wt <= 1120 oz

## 2015-09-09 DIAGNOSIS — J4530 Mild persistent asthma, uncomplicated: Secondary | ICD-10-CM

## 2015-09-09 MED ORDER — BUDESONIDE 0.25 MG/2ML IN SUSP: 0.2500 mg | Freq: Two times a day (BID) | RESPIRATORY_TRACT | Status: DC

## 2015-09-09 MED ORDER — ALBUTEROL SULFATE (2.5 MG/3ML) 0.083% IN NEBU: 2.5000 mg | INHALATION_SOLUTION | Freq: Four times a day (QID) | RESPIRATORY_TRACT | Status: DC | PRN

## 2015-09-09 MED ORDER — ARFORMOTEROL TARTRATE 15 MCG/2ML IN NEBU: 15.0000 ug | INHALATION_SOLUTION | Freq: Two times a day (BID) | RESPIRATORY_TRACT | Status: AC

## 2015-09-09 NOTE — Patient Instructions (Signed)
Brovana one vial nebulized twice per day Pulmicort one vial nebulized twice per day Albuterol one vial nebulized every 6 hours as needed  Follow up in 6 months

## 2015-09-09 NOTE — Progress Notes (Signed)
Current Outpatient Prescriptions on File Prior to Visit  Medication Sig  . ALPRAZolam (XANAX) 0.25 MG tablet Give 1/4 to 1/2 tablet up to twice per day as needed for severe irritability  . baclofen (LIORESAL) 10 MG tablet TAKE 2 TABLETS AT 7AM, 1 AT 11AM, 2 AT 3PM, AND 2 AT 8PM.  . diazepam (VALIUM) 1 MG/ML solution Give 21ml every 8 hours as needed for agitation  . medroxyPROGESTERone (DEPO-PROVERA) 150 MG/ML injection INJECT 1ML INTO THE MUSCLE EVERY 3 MONTHS.  . Nutritional Supplements (FEEDING SUPPLEMENT, JEVITY 1.5 CAL/FIBER,) LIQD Place 237 mLs into feeding tube 4 (four) times daily. 1 can at 0700, 1100, 1500, and 2000.  Marland Kitchen polyethylene glycol powder (MIRALAX) powder Take 1/2 capful every day or every other day - via peg tube -  as needed for constipation  . primidone (MYSOLINE) 50 MG tablet TAKE 4 TABLETS EVERY MORNING,4 TABLETS IN THE AFTERNOON, AND 4 TABLETS IN THE EVENING.  . ranitidine (ZANTAC) 150 MG/10ML syrup Place 20 mLs (300 mg total) into feeding tube 2 (two) times daily.  . [name] (RISPERDAL) 0.5 MG tablet Give 2 tablets via G-tube at 7AM, 2 tablets at 11AM, 2 tablets at 3PM and 1 tablet via G-tube at bedtime  . tiZANidine (ZANAFLEX) 2 MG tablet Crush 1+1/2 tablets and give in G-tube at bedtime for spasms   No current facility-administered medications on file prior to visit.     Chief Complaint  Patient presents with  . Follow-up    Discuss meds- does not feel that Pulmicort is working 100%. Albuterol seems to work great but seems to use more frequently.     Past medical hx Semilobar holoprosencephaly, agenesis of corpus callosum, cranial encephalocele, seizures, quadriparesis, scoliosis  Past surgical hx, Allergies, Family hx, Social hx all reviewed.  Vital Signs BP 108/76 mmHg  Pulse 112  Wt 58 lb (26.309 kg)  SpO2 93%  History of Present Illness Renee [name] is a 30 y.o. female with asthma and recurrent aspiration pneumonia in setting of cerebral palsy with  quadriplegia.  She is DNR/DNI.  She was treated for several episodes of pneumonia last fall.  She is still having cough with clear sputum.  Mother reports that albuterol seems to help.  She uses mucinex, and intermittently uses mucomyst.  She is not sure how much pulmicort is helping.  She is concerned that chest vest would be too harsh for her to tolerated.  Her mother does do cupping for chest PT.   Physical Exam  General - in wheelchair, moaning ENT - no oral exudate Cardiac - s1s2 regular, no murmur, tachycardic Chest - No wheeze/rales Back - kyphoscoliosis Abd - Soft, non-tender Ext - contracted Neuro - doesn't follow commands Skin - No rashes   Assessment/Plan  Asthma with recurrent episodes of aspiration pneumonia. Plan: - will add brovana - continue pulmicort with prn albuterol - continue manual chest PT by mother, mucinex, and prn mucomyst   Patient Instructions  Brovana one vial nebulized twice per day Pulmicort one vial nebulized twice per day Albuterol one vial nebulized every 6 hours as needed  Follow up in 6 months     Renee Mires, MD [facility] Pager:  [phone]

## 2015-09-29 ENCOUNTER — Telehealth: Payer: Self-pay

## 2015-09-29 NOTE — Telephone Encounter (Signed)
I called and talked to Mom. She said that for the last few weeks, Renee [name] has had bouts of increased screaming and irritability. It tends to occur after lunch, and when it occurs, she cannot be consoled. She screams, hits her head and will not calm to diaper changes, being repositioned, being verbally soothed - all things that usually help her to settle down when she gets upset. Mom has given her Diazepam but it doesn't always help her to calm and stop screaming. She gave her 1/2 tablet of Tizanidine today at and that helped Renee [name] to stop screaming and was more responsive to care. Mom has Alprazolam but tries to use it very sparingly - she has not given her that medication since this started a few weeks ago. In reviewing her medications, [name] was ordered as 2 tablets 7AM, 2 tablets at 11AM, 2 tablets at 3PM and 1 tablet at bedtime. Mom has only been giving 1 tablet at 11AM, so for now, I recommended increasing the dose to 2 tablets at 11AM, as it was ordered, and to let me know if that helped or if it made Renee [name] sleepy during the day. Mom agreed with the plan and will call me to report next week. TG

## 2015-09-29 NOTE — Telephone Encounter (Signed)
[name],mom, lvm stating that child is having increased irritability. She asked for a CB from provider. CB# [phone]

## 2015-09-29 NOTE — Telephone Encounter (Signed)
We talked about this and I agree.

## 2015-10-18 ENCOUNTER — Telehealth: Payer: Self-pay

## 2015-10-18 NOTE — Telephone Encounter (Signed)
Eli Phillips from [employer] called in regards to the patient. She states that she needs a call back so that they can continue the patient's care for another 60 days.  CB:[phone]

## 2015-10-18 NOTE — Telephone Encounter (Signed)
I called and gave a verbal order to continue care.  Apparently the fax is been sent but it has not come to my attention.  I will sign it when I see it.

## 2015-11-09 ENCOUNTER — Other Ambulatory Visit: Payer: Self-pay | Admitting: Adult Health

## 2015-11-10 ENCOUNTER — Other Ambulatory Visit: Payer: Self-pay

## 2015-11-10 ENCOUNTER — Ambulatory Visit (INDEPENDENT_AMBULATORY_CARE_PROVIDER_SITE_OTHER): Payer: 59 | Admitting: *Deleted

## 2015-11-10 ENCOUNTER — Encounter: Payer: Self-pay | Admitting: *Deleted

## 2015-11-10 DIAGNOSIS — R454 Irritability and anger: Secondary | ICD-10-CM

## 2015-11-10 DIAGNOSIS — Z3042 Encounter for surveillance of injectable contraceptive: Secondary | ICD-10-CM

## 2015-11-10 DIAGNOSIS — G808 Other cerebral palsy: Secondary | ICD-10-CM

## 2015-11-10 DIAGNOSIS — F72 Severe intellectual disabilities: Secondary | ICD-10-CM

## 2015-11-10 MED ORDER — TIZANIDINE HCL 2 MG PO TABS: ORAL_TABLET | ORAL | Status: DC

## 2015-11-10 MED ORDER — MEDROXYPROGESTERONE ACETATE 150 MG/ML IM SUSP
150.0000 mg | Freq: Once | INTRAMUSCULAR | Status: AC
  Administered 2015-11-10: 150 mg via INTRAMUSCULAR

## 2015-11-10 MED ORDER — DIAZEPAM 1 MG/ML PO SOLN: ORAL | Status: DC

## 2015-11-10 MED ORDER — RISPERIDONE 0.5 MG PO TABS: ORAL_TABLET | ORAL | Status: DC

## 2015-11-10 NOTE — Telephone Encounter (Signed)
Mom called back and asked to verify the Diazepam dose. She doesn't use it often but has had to give her some this week because Ahmoni has been irritable and screaming at night this week. Mom has tried all comfort measures and when that does not work, gives her dose of Diazepam. Mom is leaving [place] in respite care next week to go on a vacation and wanted to be sure that she gave respite worker correct information. I talked with Mom about the medication and recommended increasing Tizanidine dose from 1+1/2 tablets to 2 tablets at bedtime since Arthur has been waking at night around 3AM. I am concerned that she may be having spasms and recommended that Mom try this increase to see if it will help her to sleep longer and be calmer. I sent in the refills as requested and updated Maleiya's medication list.  TG

## 2015-11-10 NOTE — Progress Notes (Signed)
Pt here for Depo. Pt not sexually active so no pregnancy test done. Pt tolerated shot well. Return in 12 weeks for next shot. [place]

## 2015-11-10 NOTE — Telephone Encounter (Signed)
I left a message for Mom and asked her to call me back. TG 

## 2015-11-10 NOTE — Telephone Encounter (Signed)
[name], mom, lvm stating that patient has been irritable and not sleeping well this week. Giving Valium during the night. She said that she needs Diazepam refill sent to pharmacy. She would like to speak with Otila Kluver regarding dosing. CB# [phone]

## 2015-11-10 NOTE — Telephone Encounter (Signed)
I reviewed your note and agree with this plan. 

## 2015-11-14 ENCOUNTER — Ambulatory Visit: Payer: 59

## 2015-11-26 ENCOUNTER — Other Ambulatory Visit: Payer: Self-pay | Admitting: Family

## 2015-12-13 ENCOUNTER — Telehealth: Payer: Self-pay

## 2015-12-13 NOTE — Telephone Encounter (Signed)
Eli Phillips from [employer] called stating that she needs a Tax adviser, Marine scientist, or PA to cal them back so that the patient can continue to receive services for the next 60 days. She is requesting a call back.  [phone]

## 2015-12-13 NOTE — Telephone Encounter (Signed)
I called Renee [name] back and gave her approval to continue services. TG

## 2015-12-14 ENCOUNTER — Other Ambulatory Visit: Payer: Self-pay | Admitting: Pulmonary Disease

## 2015-12-14 MED ORDER — BUDESONIDE 0.25 MG/2ML IN SUSP: 0.2500 mg | Freq: Two times a day (BID) | RESPIRATORY_TRACT | Status: DC

## 2015-12-20 ENCOUNTER — Ambulatory Visit (INDEPENDENT_AMBULATORY_CARE_PROVIDER_SITE_OTHER): Payer: 59 | Admitting: Family

## 2015-12-20 ENCOUNTER — Encounter: Payer: Self-pay | Admitting: Family

## 2015-12-20 VITALS — BP 100/70 | HR 94

## 2015-12-20 DIAGNOSIS — M4140 Neuromuscular scoliosis, site unspecified: Secondary | ICD-10-CM

## 2015-12-20 DIAGNOSIS — R454 Irritability and anger: Secondary | ICD-10-CM | POA: Diagnosis not present

## 2015-12-20 DIAGNOSIS — G808 Other cerebral palsy: Secondary | ICD-10-CM

## 2015-12-20 DIAGNOSIS — G40319 Generalized idiopathic epilepsy and epileptic syndromes, intractable, without status epilepticus: Secondary | ICD-10-CM

## 2015-12-20 DIAGNOSIS — Q043 Other reduction deformities of brain: Secondary | ICD-10-CM | POA: Diagnosis not present

## 2015-12-20 DIAGNOSIS — F72 Severe intellectual disabilities: Secondary | ICD-10-CM

## 2015-12-20 DIAGNOSIS — G253 Myoclonus: Secondary | ICD-10-CM

## 2015-12-20 DIAGNOSIS — G910 Communicating hydrocephalus: Secondary | ICD-10-CM

## 2015-12-20 NOTE — Progress Notes (Signed)
Patient: Renee [name] MRN: [id number] Sex: female DOB: [birth year]  Provider: Rockwell Germany, NP Location of Care: [facility]  Note type: Routine return visit  History of Present Illness: Referral Source: Dr. Sharilyn Sites History from: referring office, [facility] chart and mother, caregiver Chief Complaint: Seizures, wheelchair evaluation  Renee [name] is a 30 y.o. 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 Renee [name] has minor motor seizures that typically occur a few times per week. 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 The patient is medically fragile, wheelchair bound and requires total care. She requires nebulizer treatments and intermittent oxygen at home. She has difficulties managing respiratory secretions and sees a pulmonologist regularly. Renee [name] was last seen June 20, 2015.   Renee [name] sometimes has increase in myoclonus, and then an increase in seizures. Mom gives her an occasional extra Mysoline and the behaviors improve. Renee [name] has frequent episodes of agitation, irritability and crying. When she was last seen, the Tizanidine dose was increased at night and Mom feels that has helped her to be more comfortable. Renee [name] also takes [name] and Mom feels that she is less irritable on her current dose. When Renee [name] is extremely agitated, Mom gives her Xanax 0.25mg  - 1/4 tablet and that helps her to be able to be consoled.   Renee [name] spends most of her day in a wheelchair. 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 Renee [name] enjoys being able to sit in the chair and be involved with family activities, and it is her only method of mobility.  Renee [name]'s mother says that Renee [name] has been otherwise healthy and  she has no other health concerns for Renee [name] today other than previously mentioned.  Review of Systems: Please see the HPI for neurologic and other pertinent review of systems. Otherwise, the following systems are noncontributory including constitutional, eyes, ears, nose and throat, cardiovascular, respiratory, gastrointestinal, genitourinary, musculoskeletal, skin, endocrine, hematologic/lymph, allergic/immunologic and psychiatric.   Past Medical History  Diagnosis Date  . Cerebral palsy ([place])   . Mental retardation   . Hydrocephalus   . Seizure disorder ([place])   . Osteopenia   . Hip dysplasia     bilateral  . DNR (do not resuscitate)   . DNI (do not intubate)   . Scoliosis   . Asthma     breathing treatment  . DNR (do not resuscitate) 04/28/2014  . UTI (urinary tract infection)    Hospitalizations: No., Head Injury: No., Nervous System Infections: No., Immunizations up to date: Yes.   Past Medical History Comments: See history  Surgical History Past Surgical History  Procedure Laterality Date  . Spinal fusion  2005  . 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 Laparoscopic nissen fundoplication    . Gastrostomy tube placement      at same time as nissen  . Tympanostomy tube placement    . Ileopsoas release Bilateral     Dr Susy Frizzle  . Tenotomy adductor / hamstring closed Bilateral 04/25/00    Family History family history includes Breast cancer in her maternal grandmother; Heart disease in her maternal grandfather; Thyroid disease in her maternal aunt and maternal grandfather. Family History is otherwise negative for migraines, seizures, cognitive impairment, blindness, deafness, birth defects, chromosomal  disorder, autism.  Social History Social History   Social History  . Marital Status: Single    Spouse Name: N/A  . Number of Children: 0  . Years of Education: N/A   Occupational History  . disabled    Social History Main Topics  . Smoking status: Never Smoker   .  Smokeless tobacco: Never Used  . Alcohol Use: No  . Drug Use: No  . Sexual Activity: No   Other Topics Concern  . None   Social History Narrative   Renee [name] requires total care in all aspects of daily living. She lives with her mother, who receives some help from a home health aide for Renee [name]. Renee [name] enjoys watching TV.    Allergies Allergies  Allergen Reactions  . Other     Paper and adhesive tape - blisters  . Penicillins Rash    Has patient had a PCN reaction causing immediate rash, facial/tongue/throat swelling, SOB or lightheadedness with hypotension: NO Has patient had a PCN reaction causing severe rash involving mucus membranes or skin necrosis: NO Has patient had a PCN reaction that required hospitalization: NO Has patient had a PCN reaction occurring within the last 10 years: NO If all of the above answers are "NO", then may proceed with Cephalosporin use.   . Latex Rash and Other (See Comments)    blisters    Physical Exam BP 100/70 mmHg  Pulse 94  Ht   Wt  General: thin female with increased tone, contractures and wasting of her arms and legs, lying on the exam table  Head: oxycephaly, cranial encephalocele, mid-face hypoplasia  Neck: supple with no carotid or supraclavicular bruits.  Respiratory: lungs clear to auscultation  Cardiovascular: regular rate and rhythm, no murmurs  Musculoskeletal: increased tone, fixed contractures and atrophy of her limbs, with some sparing of the right arm.  Skin: She has callouses on her some of her fingers from chewing on them. She has mild redness on her bony prominences but no areas of broken skin.  Neurologic Exam  Mental Status: She has severe mental retardation. She has no language. 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 She was calmer today than in previous visits and smiled when moved from the exam table to her chair. Cranial Nerves: She has dysconjugate roving eye movements. I could not illicit a red reflex. She  occasionally turned to localize sounds but I could not get her to do so consistently. She has lower facial weakness with drooling. She does not protrude the tongue but it appears to be midline.  Motor: Increased tone and rigidity in all extremities. There is some sparing of the right arm, which can be almost fully extended. She has fixed contractures and atrophy in the left arm and both lower extremities. She has tight heel cords. She occasionally rubs her mouth and her head with her right hand.  Sensory: Withdrawal x 4.  Coordination: Unable to follow instructions to assess coordination  Gait and Station: Wheelchair bound - unable to stand or bear weight Reflexes: 1+ in the right upper extremity and absent in the other extremities. I could not illicit clonus.  Impression 1. 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 2. Minor motor seizures 3. Significant intellectual delay 4. Spastic quadriparesis 5. Severe dysphagia 6. Decreased visual acuity 7. Agitation and irritability   Recommendations for plan of care The patient's previous [facility] records were reviewed. Billiejo has neither had nor required  imaging or lab studies since the last visit. 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 Allison has episodes of myoclonus as well as minor motor seizures that intermittently increase in frequency. Jonnie has episodes of agitation but has been calmer overall with her current regimen of medication. Barbi needs to be evaluated for a better fitting wheelchair and I will order a PT evaluation for that. She has been otherwise fairly healthy since she was last seen. Mom  is a strong advocate for Khalil and is devoted to her care. I will see Aldene back in follow up in 6 months or sooner if needed  The medication list was reviewed and reconciled.  No changes were made in the prescribed medications today.  A complete medication list was provided to the patient/caregiver.    Medication List       This list is accurate as of: 12/20/15 11:25 AM.  Always use your most recent med list.               albuterol (2.5 MG/3ML) 0.083% nebulizer solution  Commonly known as:  PROVENTIL  Take 3 mLs (2.5 mg total) by nebulization every 6 (six) hours as needed for wheezing or shortness of breath.     ALPRAZolam 0.25 MG tablet  Commonly known as:  XANAX  Give 1/4 to 1/2 tablet up to twice per day as needed for severe irritability     arformoterol 15 MCG/2ML Nebu  Commonly known as:  BROVANA  Take 2 mLs (15 mcg total) by nebulization 2 (two) times daily.     baclofen 10 MG tablet  Commonly known as:  LIORESAL  TAKE 2 TABLETS AT 7AM, 1 AT 11AM, 2 AT 3PM, AND 2 AT 8PM.     budesonide 0.25 MG/2ML nebulizer solution  Commonly known as:  PULMICORT  Take 2 mLs (0.25 mg total) by nebulization 2 (two) times daily.     diazepam 1 MG/ML solution  Commonly known as:  VALIUM  Give 43ml every 8 hours as needed for agitation     feeding supplement (JEVITY 1.5 CAL/FIBER) Liqd  Place 237 mLs into feeding tube 4 (four) times daily. 1 can at 0700, 1100, 1500, and 2000.     medroxyPROGESTERone 150 MG/ML injection  Commonly known as:  DEPO-PROVERA  INJECT 1ML INTO THE MUSCLE EVERY 3 MONTHS.     polyethylene glycol powder powder  Commonly known as:  MIRALAX  Take 1/2 capful every day or every other day - via peg tube -  as needed for constipation     primidone 50 MG tablet  Commonly known as:  MYSOLINE  TAKE 4 TABLETS EVERY MORNING,4 TABLETS IN THE AFTERNOON, AND 4 TABLETS IN THE EVENING.     ranitidine 150 MG/10ML syrup  Commonly known as:  ZANTAC  Place 20 mLs (300 mg total)  into feeding tube 2 (two) times daily.     [name] 0.5 MG tablet  Commonly known as:  RISPERDAL  Give 3 tablets via G-tube at 7AM, 3 tablets at 11AM, 3 tablets at 3PM and 1 tablet via G-tube at bedtime     tiZANidine 2 MG tablet  Commonly known as:  ZANAFLEX  Crush 2 tablets and give in G-tube at bedtime for spasms. May  repeat 1/2 tablet during the night if needed for spasms        Dr. Gaynell Face was consulted regarding the patient.   Total time spent with the patient was 30 minutes, of which 50% or more was spent in counseling and coordination of care.   Rockwell Germany

## 2015-12-22 NOTE — Patient Instructions (Signed)
Continue giving Christopher's medications as you have been doing. Let me know if she has increase in seizures or agitation.   I will send an order for a PT evaluation for a wheelchair. Let me know if you do not hear from [facility] within a few weeks.   Please plan to return for follow up in 6 months or sooner if needed.

## 2015-12-22 NOTE — Addendum Note (Signed)
Addended by: Joelyn Oms on: 12/22/2015 11:17 AM   Modules accepted: Orders

## 2015-12-23 ENCOUNTER — Encounter (HOSPITAL_COMMUNITY): Payer: Self-pay | Admitting: Emergency Medicine

## 2015-12-23 ENCOUNTER — Emergency Department (HOSPITAL_COMMUNITY): Payer: 59

## 2015-12-23 ENCOUNTER — Observation Stay (HOSPITAL_COMMUNITY)
Admission: EM | Admit: 2015-12-23 | Discharge: 2015-12-26 | Disposition: A | Discharge status: Home Health | Payer: 59 | Attending: Internal Medicine | Admitting: Internal Medicine

## 2015-12-23 DIAGNOSIS — S7291XA Unspecified fracture of right femur, initial encounter for closed fracture: Secondary | ICD-10-CM | POA: Diagnosis present

## 2015-12-23 DIAGNOSIS — D72829 Elevated white blood cell count, unspecified: Secondary | ICD-10-CM | POA: Insufficient documentation

## 2015-12-23 DIAGNOSIS — G40909 Epilepsy, unspecified, not intractable, without status epilepticus: Secondary | ICD-10-CM | POA: Insufficient documentation

## 2015-12-23 DIAGNOSIS — J45909 Unspecified asthma, uncomplicated: Secondary | ICD-10-CM | POA: Diagnosis present

## 2015-12-23 DIAGNOSIS — X58XXXA Exposure to other specified factors, initial encounter: Secondary | ICD-10-CM | POA: Diagnosis not present

## 2015-12-23 DIAGNOSIS — Z66 Do not resuscitate: Secondary | ICD-10-CM | POA: Diagnosis not present

## 2015-12-23 DIAGNOSIS — S7221XA Displaced subtrochanteric fracture of right femur, initial encounter for closed fracture: Secondary | ICD-10-CM | POA: Diagnosis not present

## 2015-12-23 DIAGNOSIS — S72001A Fracture of unspecified part of neck of right femur, initial encounter for closed fracture: Secondary | ICD-10-CM | POA: Diagnosis not present

## 2015-12-23 DIAGNOSIS — G808 Other cerebral palsy: Secondary | ICD-10-CM | POA: Diagnosis not present

## 2015-12-23 DIAGNOSIS — F72 Severe intellectual disabilities: Secondary | ICD-10-CM | POA: Diagnosis not present

## 2015-12-23 DIAGNOSIS — G910 Communicating hydrocephalus: Secondary | ICD-10-CM | POA: Diagnosis present

## 2015-12-23 DIAGNOSIS — G809 Cerebral palsy, unspecified: Secondary | ICD-10-CM | POA: Diagnosis not present

## 2015-12-23 DIAGNOSIS — S7290XA Unspecified fracture of unspecified femur, initial encounter for closed fracture: Secondary | ICD-10-CM

## 2015-12-23 DIAGNOSIS — Z01818 Encounter for other preprocedural examination: Secondary | ICD-10-CM

## 2015-12-23 HISTORY — DX: Unspecified fracture of unspecified femur, initial encounter for closed fracture: S72.90XA

## 2015-12-23 LAB — CBC WITH DIFFERENTIAL/PLATELET
Basophils Absolute: 0 10*3/uL (ref 0.0–0.1)
Basophils Relative: 0 %
Eosinophils Absolute: 0 10*3/uL (ref 0.0–0.7)
Eosinophils Relative: 0 %
HEMATOCRIT: 48 % — AB (ref 36.0–46.0)
HEMOGLOBIN: 15.6 g/dL — AB (ref 12.0–15.0)
LYMPHS ABS: 0.6 10*3/uL — AB (ref 0.7–4.0)
LYMPHS PCT: 5 %
MCH: 33 pg (ref 26.0–34.0)
MCHC: 32.5 g/dL (ref 30.0–36.0)
MCV: 101.5 fL — ABNORMAL HIGH (ref 78.0–100.0)
MONOS PCT: 5 %
Monocytes Absolute: 0.7 10*3/uL (ref 0.1–1.0)
NEUTROS ABS: 11.9 10*3/uL — AB (ref 1.7–7.7)
NEUTROS PCT: 90 %
Platelets: 202 10*3/uL (ref 150–400)
RBC: 4.73 MIL/uL (ref 3.87–5.11)
RDW: 12.7 % (ref 11.5–15.5)
WBC: 13.3 10*3/uL — AB (ref 4.0–10.5)

## 2015-12-23 LAB — BASIC METABOLIC PANEL
ANION GAP: 8 (ref 5–15)
BUN: 6 mg/dL (ref 6–20)
CHLORIDE: 100 mmol/L — AB (ref 101–111)
CO2: 28 mmol/L (ref 22–32)
Calcium: 9.4 mg/dL (ref 8.9–10.3)
Creatinine, Ser: <0.3 mg/dL — ABNORMAL LOW (ref 0.44–1.00)
Glucose, Bld: 138 mg/dL — ABNORMAL HIGH (ref 65–99)
POTASSIUM: 4.1 mmol/L (ref 3.5–5.1)
SODIUM: 136 mmol/L (ref 135–145)

## 2015-12-23 LAB — TYPE AND SCREEN
ABO/RH(D): O POS
ANTIBODY SCREEN: NEGATIVE

## 2015-12-23 LAB — ABO/RH: ABO/RH(D): O POS

## 2015-12-23 LAB — PROTIME-INR
INR: 1.18 (ref 0.00–1.49)
PROTHROMBIN TIME: 15.2 s (ref 11.6–15.2)

## 2015-12-23 MED ORDER — FENTANYL CITRATE (PF) 100 MCG/2ML IJ SOLN
50.0000 ug | INTRAMUSCULAR | Status: DC | PRN
  Filled 2015-12-23: qty 2

## 2015-12-23 MED ORDER — ONDANSETRON HCL 4 MG/2ML IJ SOLN
4.0000 mg | Freq: Once | INTRAMUSCULAR | Status: AC
  Administered 2015-12-23: 4 mg via INTRAVENOUS
  Filled 2015-12-23: qty 2

## 2015-12-23 MED ORDER — FENTANYL CITRATE (PF) 100 MCG/2ML IJ SOLN
25.0000 ug | INTRAMUSCULAR | Status: AC | PRN
  Administered 2015-12-23 – 2015-12-24 (×2): 25 ug via INTRAVENOUS
  Filled 2015-12-23: qty 2

## 2015-12-23 NOTE — H&P (Signed)
History and Physical    Renee [name] [id number] DOB: 1985-08-28 DOA: 12/23/2015  PCP: Purvis Kilts, MD   Patient coming from:   Chief Complaint:   HPI: Renee [name] is a 30 y.o. female with medical history significant of cerebral palsy, mental retardation, hydrocephalus, seizure disorder, osteopenia, bilateral hip dysplasia, scoliosis, asthma who was brought to the emergency department by her mother after she noticed that the patient's right hip was hyperextended and not feeling normal.  Per patient's mother, she works during the daytime and the patient is taking care by home health nurses. She states that she did not get any incident report from the home health nursing staff. Her mother went to change the patient's diaper and noticed that her right hip was not feeling normal. The patient also seemed to be in a lot of discomfort. She had surgery on her right hip about 15 years ago.  ED Course: The patient was initially moaning and in acute distress on arrival to the ED. She seems to be doing better after she was given analgesics. Workup showed right hip fracture and mild leukocytosis.   Review of Systems: As per HPI otherwise 10 point review of systems negative.    Past Medical History  Diagnosis Date  . Cerebral palsy ([place])   . Mental retardation   . Hydrocephalus   . Seizure disorder ([place])   . Osteopenia   . Hip dysplasia     bilateral  . DNR (do not resuscitate)   . DNI (do not intubate)   . Scoliosis   . Asthma     breathing treatment  . DNR (do not resuscitate) 04/28/2014  . UTI (urinary tract infection)     Past Surgical History  Procedure Laterality Date  . Spinal fusion  2005  . 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 Laparoscopic nissen fundoplication    . Gastrostomy tube placement      at same time as nissen  . Tympanostomy tube placement    . Ileopsoas release Bilateral     Dr Susy Frizzle  . Tenotomy adductor / hamstring closed Bilateral 04/25/00     reports that she has never smoked. She has never used smokeless tobacco. She reports that she does not drink alcohol or use illicit drugs.  Allergies  Allergen Reactions  . Other     Paper and adhesive tape - blisters  . Penicillins Rash    Has patient had a PCN reaction causing immediate rash, facial/tongue/throat swelling, SOB or lightheadedness with hypotension: NO Has patient had a PCN reaction causing severe rash involving mucus membranes or skin necrosis: NO Has patient had a PCN reaction that required hospitalization: NO Has patient had a PCN reaction occurring within the last 10 years: NO If all of the above answers are "NO", then may proceed with Cephalosporin use.   . Latex Rash and Other (See Comments)    blisters    Family History  Problem Relation Age of Onset  . Breast cancer Maternal Grandmother   . Heart disease Maternal Grandfather   . Thyroid disease Maternal Grandfather   . Thyroid disease Maternal Aunt     Prior to Admission medications   Medication Sig Start Date End Date Taking? Authorizing Provider  albuterol (PROVENTIL) (2.5 MG/3ML) 0.083% nebulizer solution Take 3 mLs (2.5 mg total) by nebulization every 6 (six) hours as needed for wheezing or shortness of breath. 09/09/15  Yes [name], MD  ALPRAZolam Duanne Moron) 0.25 MG  tablet Give 1/4 to 1/2 tablet up to twice per day as needed for severe irritability 06/20/15  Yes Rockwell Germany, NP  arformoterol (BROVANA) 15 MCG/2ML NEBU Take 2 mLs (15 mcg total) by nebulization 2 (two) times daily. Patient taking differently: Take 15 mcg by nebulization as needed.  09/09/15  Yes [name], MD  baclofen (LIORESAL) 10 MG tablet TAKE 2 TABLETS AT 7AM, 1 AT 11AM, 2 AT 3PM, AND 2 AT 8PM. 07/04/15  Yes Rockwell Germany, NP  budesonide (PULMICORT) 0.25 MG/2ML nebulizer solution Take 2 mLs (0.25 mg total) by nebulization 2 (two) times daily. 12/14/15  Yes [name], MD  diazepam (VALIUM) 1 MG/ML solution Give 44ml every 8  hours as needed for agitation 11/10/15  Yes Rockwell Germany, NP  medroxyPROGESTERone (DEPO-PROVERA) 150 MG/ML injection INJECT 1ML INTO THE MUSCLE EVERY 3 MONTHS. 11/09/15  Yes Estill Dooms, NP  Nutritional Supplements (FEEDING SUPPLEMENT, JEVITY 1.5 CAL/FIBER,) LIQD Place 237 mLs into feeding tube 4 (four) times daily. 1 can at 0700, 1100, 1500, and 2000.   Yes Historical Provider, MD  polyethylene glycol powder (MIRALAX) powder Take 1/2 capful every day or every other day - via peg tube -  as needed for constipation 01/25/15  Yes Lafayette Dragon, MD  primidone (MYSOLINE) 50 MG tablet TAKE 4 TABLETS EVERY MORNING,4 TABLETS IN THE AFTERNOON, AND 4 TABLETS IN THE EVENING. 11/28/15  Yes Rockwell Germany, NP  ranitidine (ZANTAC) 150 MG/10ML syrup Place 20 mLs (300 mg total) into feeding tube 2 (two) times daily. 05/18/15  Yes Manus Gunning, MD  [name] (RISPERDAL) 0.5 MG tablet Give 3 tablets via G-tube at 7AM, 3 tablets at 11AM, 3 tablets at 3PM and 1 tablet via G-tube at bedtime 11/10/15  Yes Rockwell Germany, NP  tiZANidine (ZANAFLEX) 2 MG tablet Crush 2 tablets and give in G-tube at bedtime for spasms. May repeat 1/2 tablet during the night if needed for spasms 11/10/15  Yes Rockwell Germany, NP    Physical Exam: Filed Vitals:   12/23/15 2215 12/23/15 2230 12/23/15 2245 12/23/15 2315  BP: 116/81 115/77 104/68 102/66  Pulse: 80 76 91 86  Temp:      TempSrc:      Resp:    16  SpO2: 99% 99% 98% 100%      Constitutional: Small body frame and underdeveloped. Filed Vitals:   12/23/15 2215 12/23/15 2230 12/23/15 2245 12/23/15 2315  BP: 116/81 115/77 104/68 102/66  Pulse: 80 76 91 86  Temp:      TempSrc:      Resp:    16  SpO2: 99% 99% 98% 100%   Eyes: PERRL, lids and conjunctivae normal ENMT: Mucous membranes are dry. Posterior pharynx clear of any exudate or lesions. Neck: normal, supple, no masses, no thyromegaly Respiratory: clear to auscultation bilaterally, no wheezing,  no crackles. No accessory muscle use.  Cardiovascular: Regular rate and rhythm, no murmurs / rubs / gallops. No extremity edema. 2+ pedal pulses. No carotid bruits.  Abdomen: PEG tube in place, no tenderness, no masses palpated. No hepatosplenomegaly. Bowel sounds positive.  Musculoskeletal: Shortenned right lower extremity and extremities muscle contractures.. Skin: no rashes, lesions, ulcers. No induration Neurologic: Spastic quadriplegia. Psychiatric:  Nonverbal.   Labs on Admission: I have personally reviewed following labs and imaging studies  CBC:  Recent Labs Lab 12/23/15 2229  WBC 13.3*  NEUTROABS 11.9*  HGB 15.6*  HCT 48.0*  MCV 101.5*  PLT 123XX123   Basic Metabolic Panel:  Recent Labs  Lab 12/23/15 2229  NA 136  K 4.1  CL 100*  CO2 28  GLUCOSE 138*  BUN 6  CREATININE <0.30*  CALCIUM 9.4   GFR: CrCl cannot be calculated (Unknown ideal weight.).  Coagulation Profile:  Recent Labs Lab 12/23/15 2229  INR 1.18   Urine analysis:    Component Value Date/Time   COLORURINE RED* 04/26/2014 1529   APPEARANCEUR TURBID* 04/26/2014 1529   LABSPEC 1.017 04/26/2014 1529   PHURINE 6.0 04/26/2014 1529   GLUCOSEU NEGATIVE 04/26/2014 1529   HGBUR LARGE* 04/26/2014 1529   BILIRUBINUR SMALL* 04/26/2014 1529   KETONESUR NEGATIVE 04/26/2014 1529   PROTEINUR >300* 04/26/2014 1529   UROBILINOGEN 1.0 04/26/2014 1529   NITRITE NEGATIVE 04/26/2014 1529   LEUKOCYTESUR LARGE* 04/26/2014 1529   Radiological Exams on Admission: Dg Hip Unilat  With Pelvis 2-3 Views Right  12/23/2015  CLINICAL DATA:  Abnormal displacement of the right leg. Initial encounter. EXAM: DG HIP (WITH OR WITHOUT PELVIS) 2-3V RIGHT COMPARISON:  None. FINDINGS: There is a displaced fracture extending through the right proximal femoral diaphysis, with more than 1 shaft width displacement. Displacement, shortening and angulation vary depending on positioning. Significant surrounding soft tissue swelling is  noted. The right femoral head remains seated at the acetabulum. Fusion hardware is noted along the thoracolumbar spine and sacroiliac joints. IMPRESSION: Displaced fracture extends through the right proximal femoral diaphysis, with more than 1 shaft width displacement. Displacement, shortening and angulation vary depending on positioning. Electronically Signed   By: Garald Balding M.D.   On: 12/23/2015 21:19     Assessment/Plan Principal Problem:   Closed right hip fracture ([place]) Admit to MedSurg. Continue analgesics as needed. Keep nothing by mouth. Orthopedic surgery to evaluate.  Active Problems:   Asthma. Continue supplemental oxygen. Continue bronchodilators as needed.    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 Resume home meds once cleared for oral intake.    DVT prophylaxis: Heparin SQ. Code Status: DO NOT RESUSCITATE/DO NOT INTUBATE. Family Communication: Her mother was present in the room. Disposition Plan: Admit for further evaluation and treatment. Consults called: Orthopedic surgery (Dr. Sharol Given) Admission status: Inpatient/MedSurg   Reubin Milan MD [facility] Pager [phone].  If 7PM-7AM, please contact night-coverage [email or web address] Password [facility]  12/23/2015, 11:47 PM

## 2015-12-23 NOTE — ED Provider Notes (Signed)
CSN: [id number]     Arrival date & time 12/23/15  2021 History   First MD Initiated Contact with Patient 12/23/15 2212     Chief Complaint  Patient presents with  . Hip Injury     (Consider location/radiation/quality/duration/timing/severity/associated sxs/prior Treatment) HPI   Level V caveat- mental retardation  DNR/DNI  Shouldn't is nonverbal with cerebral palsy, mental retardation, hydrocephalus, seizure disorder, osteopenia, bilateral hip dysplasia, scoliosis, asthma, nonambulatory and weighs 60 pounds brought to the emergency department by her mother after noticing an abnormality to her right hip. The patient has home health nurses during the day while the mom is working. Mom denies that she got any incident report for nursing staff or was advised of patient acting abnormally. She was changing the patients diaper and said her leg fell in a strange way and her daughter seemed to be hurting. Mom denies that she appears to be in significant discomfort. No other injuries, deformities or bruising noted.     Past Medical History  Diagnosis Date  . Cerebral palsy ([place])   . Mental retardation   . Hydrocephalus   . Seizure disorder ([place])   . Osteopenia   . Hip dysplasia     bilateral  . DNR (do not resuscitate)   . DNI (do not intubate)   . Scoliosis   . Asthma     breathing treatment  . DNR (do not resuscitate) 04/28/2014  . UTI (urinary tract infection)    Past Surgical History  Procedure Laterality Date  . Spinal fusion  2005  . 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 Laparoscopic nissen fundoplication    . Gastrostomy tube placement      at same time as nissen  . Tympanostomy tube placement    . Ileopsoas release Bilateral     Dr Susy Frizzle  . Tenotomy adductor / hamstring closed Bilateral 04/25/00   Family History  Problem Relation Age of Onset  . Breast cancer Maternal Grandmother   . Heart disease Maternal Grandfather   . Thyroid disease Maternal Grandfather   .  Thyroid disease Maternal Aunt    Social History  Substance Use Topics  . Smoking status: Never Smoker   . Smokeless tobacco: Never Used  . Alcohol Use: No   OB History    Gravida Para Term Preterm AB TAB SAB Ectopic Multiple Living   0 0 0 0 0 0 0 0 0 0      Review of Systems  Level V caveat- mental retardation  Allergies  Other; Penicillins; and Latex  Home Medications   Prior to Admission medications   Medication Sig Start Date End Date Taking? Authorizing Provider  albuterol (PROVENTIL) (2.5 MG/3ML) 0.083% nebulizer solution Take 3 mLs (2.5 mg total) by nebulization every 6 (six) hours as needed for wheezing or shortness of breath. 09/09/15  Yes [name], MD  ALPRAZolam Duanne Moron) 0.25 MG tablet Give 1/4 to 1/2 tablet up to twice per day as needed for severe irritability 06/20/15  Yes Rockwell Germany, NP  arformoterol (BROVANA) 15 MCG/2ML NEBU Take 2 mLs (15 mcg total) by nebulization 2 (two) times daily. Patient taking differently: Take 15 mcg by nebulization as needed.  09/09/15  Yes [name], MD  baclofen (LIORESAL) 10 MG tablet TAKE 2 TABLETS AT 7AM, 1 AT 11AM, 2 AT 3PM, AND 2 AT 8PM. 07/04/15  Yes Rockwell Germany, NP  budesonide (PULMICORT) 0.25 MG/2ML nebulizer solution Take 2 mLs (0.25 mg total) by nebulization 2 (  two) times daily. 12/14/15  Yes [name], MD  diazepam (VALIUM) 1 MG/ML solution Give 41ml every 8 hours as needed for agitation 11/10/15  Yes Rockwell Germany, NP  medroxyPROGESTERone (DEPO-PROVERA) 150 MG/ML injection INJECT 1ML INTO THE MUSCLE EVERY 3 MONTHS. 11/09/15  Yes Estill Dooms, NP  Nutritional Supplements (FEEDING SUPPLEMENT, JEVITY 1.5 CAL/FIBER,) LIQD Place 237 mLs into feeding tube 4 (four) times daily. 1 can at 0700, 1100, 1500, and 2000.   Yes Historical Provider, MD  polyethylene glycol powder (MIRALAX) powder Take 1/2 capful every day or every other day - via peg tube -  as needed for constipation 01/25/15  Yes Lafayette Dragon, MD  primidone  (MYSOLINE) 50 MG tablet TAKE 4 TABLETS EVERY MORNING,4 TABLETS IN THE AFTERNOON, AND 4 TABLETS IN THE EVENING. 11/28/15  Yes Rockwell Germany, NP  ranitidine (ZANTAC) 150 MG/10ML syrup Place 20 mLs (300 mg total) into feeding tube 2 (two) times daily. 05/18/15  Yes Manus Gunning, MD  [name] (RISPERDAL) 0.5 MG tablet Give 3 tablets via G-tube at 7AM, 3 tablets at 11AM, 3 tablets at 3PM and 1 tablet via G-tube at bedtime 11/10/15  Yes Rockwell Germany, NP  tiZANidine (ZANAFLEX) 2 MG tablet Crush 2 tablets and give in G-tube at bedtime for spasms. May repeat 1/2 tablet during the night if needed for spasms 11/10/15  Yes Rockwell Germany, NP   BP 115/77 mmHg  Pulse 76  Temp(Src) 97.9 F (36.6 C) (Axillary)  Resp 20  SpO2 99% Physical Exam  Constitutional: Vital signs are normal. No distress.  Patient is tiny and underdeveloped.   HENT:  Head: Normocephalic and atraumatic. Head is without raccoon's eyes, without Battle's sign, without abrasion, without contusion, without right periorbital erythema and without left periorbital erythema.  Nose: Nose normal.  Mouth/Throat: Uvula is midline.  Eyes: Conjunctivae and lids are normal. Pupils are equal, round, and reactive to light.  Neck: Normal range of motion. Neck supple. Normal range of motion present.  Cardiovascular: Normal rate and regular rhythm.   Pulmonary/Chest: Effort normal. She has no decreased breath sounds.  No bruising noted to chest wall  Abdominal: Soft. Bowel sounds are normal. There is no tenderness. There is no guarding.  No bruising noted to abdominal wall. + gastrostomy tube in place  Musculoskeletal:  Pt has bilateral upper and lower extremity contractions. Her right hip is shortened. Difficult to tell if it is rotated.  Neurological: She is alert.  Skin: Skin is warm and dry.  Psychiatric: She is agitated.  Nursing note and vitals reviewed.   ED Course  Procedures (including critical care time) Labs  Review Labs Reviewed  BASIC METABOLIC PANEL - Abnormal; Notable for the following:    Chloride 100 (*)    Glucose, Bld 138 (*)    Creatinine, Ser <0.30 (*)    All other components within normal limits  CBC WITH DIFFERENTIAL/PLATELET - Abnormal; Notable for the following:    WBC 13.3 (*)    Hemoglobin 15.6 (*)    HCT 48.0 (*)    MCV 101.5 (*)    Neutro Abs 11.9 (*)    Lymphs Abs 0.6 (*)    All other components within normal limits  PROTIME-INR  TYPE AND SCREEN  ABO/RH    Imaging Review Dg Hip Unilat  With Pelvis 2-3 Views Right  12/23/2015  CLINICAL DATA:  Abnormal displacement of the right leg. Initial encounter. EXAM: DG HIP (WITH OR WITHOUT PELVIS) 2-3V RIGHT COMPARISON:  None. FINDINGS: There  is a displaced fracture extending through the right proximal femoral diaphysis, with more than 1 shaft width displacement. Displacement, shortening and angulation vary depending on positioning. Significant surrounding soft tissue swelling is noted. The right femoral head remains seated at the acetabulum. Fusion hardware is noted along the thoracolumbar spine and sacroiliac joints. IMPRESSION: Displaced fracture extends through the right proximal femoral diaphysis, with more than 1 shaft width displacement. Displacement, shortening and angulation vary depending on positioning. Electronically Signed   By: Garald Balding M.D.   On: 12/23/2015 21:19   I have personally reviewed and evaluated these images and lab results as part of my medical decision-making.   EKG Interpretation None      MDM   Final diagnoses:  Closed fracture of right femur, unspecified fracture morphology, unspecified portion of femur, initial encounter ([place])    I spoke with Dr. Sharol Given, he recommends pain control and a brace. He will see patient in the morning and asks medicine to admit. If the mom would prefer surgery then he recommends transfer to [facility].  After easy discussion with the mother, due to the patients  healthy problems she would prefer conservative measures if at all possible as she is concerned that Al[place] wouldn't have a good outcome with surgery.   Dr. Olevia Bowens has agreed for admission for the patient. He has declined temp orders at this time.  Filed Vitals:   12/23/15 2215 12/23/15 2230  BP: 116/81 115/77  Pulse: 80 76  Temp:    Resp:       Delos Haring, PA-C 12/23/15 2327  Leo Grosser, MD 12/24/15 [phone]

## 2015-12-23 NOTE — ED Notes (Signed)
Pt here with mom- pt is non verbal with CP and hydrocephalus. Pt mom reports home health nurses care for patient during the day while she is working. Mom reports when she got home, she went to change pts diaper and noticed that pt right hip was extending beyond normal ROM. Pt moaning in wheelchair, but ,om sts this is normal. Pt has hx of surgery on right hip 15 years ago.

## 2015-12-24 ENCOUNTER — Inpatient Hospital Stay (HOSPITAL_COMMUNITY): Payer: 59

## 2015-12-24 DIAGNOSIS — J45909 Unspecified asthma, uncomplicated: Secondary | ICD-10-CM | POA: Diagnosis present

## 2015-12-24 DIAGNOSIS — S7291XA Unspecified fracture of right femur, initial encounter for closed fracture: Secondary | ICD-10-CM | POA: Diagnosis not present

## 2015-12-24 DIAGNOSIS — F72 Severe intellectual disabilities: Secondary | ICD-10-CM | POA: Diagnosis not present

## 2015-12-24 DIAGNOSIS — G808 Other cerebral palsy: Secondary | ICD-10-CM

## 2015-12-24 DIAGNOSIS — S72001A Fracture of unspecified part of neck of right femur, initial encounter for closed fracture: Secondary | ICD-10-CM | POA: Diagnosis not present

## 2015-12-24 DIAGNOSIS — Z66 Do not resuscitate: Secondary | ICD-10-CM | POA: Diagnosis not present

## 2015-12-24 DIAGNOSIS — S7221XA Displaced subtrochanteric fracture of right femur, initial encounter for closed fracture: Secondary | ICD-10-CM | POA: Diagnosis not present

## 2015-12-24 LAB — CBC WITH DIFFERENTIAL/PLATELET
Basophils Absolute: 0 10*3/uL (ref 0.0–0.1)
Basophils Relative: 0 %
EOS PCT: 1 %
Eosinophils Absolute: 0.1 10*3/uL (ref 0.0–0.7)
HEMATOCRIT: 47 % — AB (ref 36.0–46.0)
Hemoglobin: 15.3 g/dL — ABNORMAL HIGH (ref 12.0–15.0)
LYMPHS ABS: 1.5 10*3/uL (ref 0.7–4.0)
LYMPHS PCT: 19 %
MCH: 32.7 pg (ref 26.0–34.0)
MCHC: 32.6 g/dL (ref 30.0–36.0)
MCV: 100.4 fL — AB (ref 78.0–100.0)
MONO ABS: 1.2 10*3/uL — AB (ref 0.1–1.0)
Monocytes Relative: 15 %
NEUTROS ABS: 5.1 10*3/uL (ref 1.7–7.7)
Neutrophils Relative %: 65 %
PLATELETS: 198 10*3/uL (ref 150–400)
RBC: 4.68 MIL/uL (ref 3.87–5.11)
RDW: 13 % (ref 11.5–15.5)
WBC: 7.8 10*3/uL (ref 4.0–10.5)

## 2015-12-24 LAB — COMPREHENSIVE METABOLIC PANEL
ALBUMIN: 3.6 g/dL (ref 3.5–5.0)
ALT: 27 U/L (ref 14–54)
AST: 25 U/L (ref 15–41)
Alkaline Phosphatase: 174 U/L — ABNORMAL HIGH (ref 38–126)
Anion gap: 12 (ref 5–15)
CHLORIDE: 100 mmol/L — AB (ref 101–111)
CO2: 25 mmol/L (ref 22–32)
Calcium: 9.3 mg/dL (ref 8.9–10.3)
Creatinine, Ser: <0.3 mg/dL — ABNORMAL LOW (ref 0.44–1.00)
GLUCOSE: 93 mg/dL (ref 65–99)
POTASSIUM: 4 mmol/L (ref 3.5–5.1)
SODIUM: 137 mmol/L (ref 135–145)
Total Bilirubin: 0.5 mg/dL (ref 0.3–1.2)
Total Protein: 7.1 g/dL (ref 6.5–8.1)

## 2015-12-24 LAB — GLUCOSE, CAPILLARY
Glucose-Capillary: 128 mg/dL — ABNORMAL HIGH (ref 65–99)
Glucose-Capillary: 161 mg/dL — ABNORMAL HIGH (ref 65–99)

## 2015-12-24 LAB — MAGNESIUM: Magnesium: 2.2 mg/dL (ref 1.7–2.4)

## 2015-12-24 LAB — URINALYSIS, ROUTINE W REFLEX MICROSCOPIC
Bilirubin Urine: NEGATIVE
Glucose, UA: NEGATIVE mg/dL
HGB URINE DIPSTICK: NEGATIVE
Ketones, ur: NEGATIVE mg/dL
NITRITE: NEGATIVE
Protein, ur: NEGATIVE mg/dL
SPECIFIC GRAVITY, URINE: 1.012 (ref 1.005–1.030)
pH: 7 (ref 5.0–8.0)

## 2015-12-24 LAB — URINE MICROSCOPIC-ADD ON: RBC / HPF: NONE SEEN RBC/hpf (ref 0–5)

## 2015-12-24 LAB — PHOSPHORUS: PHOSPHORUS: 3.4 mg/dL (ref 2.5–4.6)

## 2015-12-24 MED ORDER — BUDESONIDE 0.25 MG/2ML IN SUSP
0.2500 mg | Freq: Two times a day (BID) | RESPIRATORY_TRACT | Status: DC
  Administered 2015-12-24 – 2015-12-26 (×5): 0.25 mg via RESPIRATORY_TRACT
  Filled 2015-12-24 (×5): qty 2

## 2015-12-24 MED ORDER — FAMOTIDINE IN NACL 20-0.9 MG/50ML-% IV SOLN
20.0000 mg | INTRAVENOUS | Status: DC
  Filled 2015-12-24: qty 50

## 2015-12-24 MED ORDER — ALPRAZOLAM 0.25 MG PO TABS: 0.2500 mg | ORAL_TABLET | Freq: Two times a day (BID) | ORAL | Status: DC | PRN

## 2015-12-24 MED ORDER — ONDANSETRON HCL 4 MG PO TABS: 4.0000 mg | ORAL_TABLET | Freq: Four times a day (QID) | ORAL | Status: DC | PRN

## 2015-12-24 MED ORDER — SODIUM CHLORIDE 0.9% FLUSH
3.0000 mL | Freq: Two times a day (BID) | INTRAVENOUS | Status: DC
  Administered 2015-12-24 – 2015-12-26 (×4): 3 mL via INTRAVENOUS

## 2015-12-24 MED ORDER — BACLOFEN 10 MG PO TABS
10.0000 mg | ORAL_TABLET | Freq: Every day | ORAL | Status: DC
  Administered 2015-12-25 – 2015-12-26 (×2): 10 mg via ORAL
  Filled 2015-12-24 (×2): qty 1

## 2015-12-24 MED ORDER — PRIMIDONE 50 MG PO TABS
50.0000 mg | ORAL_TABLET | ORAL | Status: DC
  Administered 2015-12-24 – 2015-12-26 (×5): 50 mg via ORAL
  Filled 2015-12-24 (×6): qty 1

## 2015-12-24 MED ORDER — JEVITY 1.5 CAL/FIBER PO LIQD
237.0000 mL | Freq: Four times a day (QID) | ORAL | Status: DC
  Administered 2015-12-24 – 2015-12-26 (×8): 237 mL
  Filled 2015-12-24 (×14): qty 1000

## 2015-12-24 MED ORDER — RISPERIDONE 0.5 MG PO TABS
1.5000 mg | ORAL_TABLET | Freq: Every day | ORAL | Status: DC
  Administered 2015-12-24 – 2015-12-25 (×2): 1.5 mg via ORAL

## 2015-12-24 MED ORDER — RISPERIDONE 0.5 MG PO TABS
0.5000 mg | ORAL_TABLET | Freq: Four times a day (QID) | ORAL | Status: DC
  Administered 2015-12-24: 0.5 mg via ORAL
  Filled 2015-12-24 (×2): qty 1

## 2015-12-24 MED ORDER — HYDROMORPHONE HCL 1 MG/ML IJ SOLN
0.5000 mg | INTRAMUSCULAR | Status: DC | PRN
  Administered 2015-12-24 – 2015-12-26 (×8): 0.5 mg via INTRAVENOUS
  Filled 2015-12-24 (×8): qty 1

## 2015-12-24 MED ORDER — BACLOFEN 10 MG PO TABS
10.0000 mg | ORAL_TABLET | Freq: Four times a day (QID) | ORAL | Status: DC
  Administered 2015-12-24: 10 mg via ORAL
  Filled 2015-12-24: qty 1

## 2015-12-24 MED ORDER — BACLOFEN 10 MG PO TABS
20.0000 mg | ORAL_TABLET | Freq: Every day | ORAL | Status: DC
  Administered 2015-12-24 – 2015-12-25 (×2): 20 mg via ORAL
  Filled 2015-12-24 (×2): qty 2

## 2015-12-24 MED ORDER — PRIMIDONE 50 MG PO TABS
50.0000 mg | ORAL_TABLET | Freq: Four times a day (QID) | ORAL | Status: DC
  Administered 2015-12-24 (×2): 50 mg via ORAL
  Filled 2015-12-24 (×5): qty 1

## 2015-12-24 MED ORDER — SODIUM CHLORIDE 0.9 % IV SOLN
INTRAVENOUS | Status: DC
  Administered 2015-12-24: 50 mL/h via INTRAVENOUS
  Administered 2015-12-25: 50 mL via INTRAVENOUS

## 2015-12-24 MED ORDER — BACLOFEN 10 MG PO TABS
20.0000 mg | ORAL_TABLET | Freq: Every day | ORAL | Status: DC
  Administered 2015-12-25 – 2015-12-26 (×2): 20 mg via ORAL
  Filled 2015-12-24 (×2): qty 2

## 2015-12-24 MED ORDER — ALBUTEROL SULFATE (2.5 MG/3ML) 0.083% IN NEBU
2.5000 mg | INHALATION_SOLUTION | Freq: Four times a day (QID) | RESPIRATORY_TRACT | Status: DC | PRN
  Filled 2015-12-24: qty 3

## 2015-12-24 MED ORDER — ONDANSETRON HCL 4 MG/2ML IJ SOLN: 4.0000 mg | Freq: Four times a day (QID) | INTRAMUSCULAR | Status: DC | PRN

## 2015-12-24 MED ORDER — RANITIDINE HCL 150 MG/10ML PO SYRP
300.0000 mg | ORAL_SOLUTION | Freq: Two times a day (BID) | ORAL | Status: DC
  Administered 2015-12-24 – 2015-12-26 (×5): 300 mg
  Filled 2015-12-24 (×5): qty 20

## 2015-12-24 MED ORDER — RISPERIDONE 0.5 MG PO TABS
1.5000 mg | ORAL_TABLET | Freq: Every day | ORAL | Status: DC
  Administered 2015-12-25 – 2015-12-26 (×2): 1.5 mg via ORAL
  Filled 2015-12-24 (×2): qty 3

## 2015-12-24 MED ORDER — RISPERIDONE 0.5 MG PO TABS
0.5000 mg | ORAL_TABLET | Freq: Every day | ORAL | Status: DC
  Administered 2015-12-24 – 2015-12-25 (×2): 0.5 mg via ORAL
  Filled 2015-12-24 (×4): qty 1

## 2015-12-24 MED ORDER — TIZANIDINE HCL 4 MG PO TABS
2.0000 mg | ORAL_TABLET | Freq: Every evening | ORAL | Status: DC | PRN
  Administered 2015-12-24 – 2015-12-25 (×2): 4 mg via ORAL
  Filled 2015-12-24 (×2): qty 1

## 2015-12-24 MED ORDER — JEVITY 1.2 CAL PO LIQD: 1000.0000 mL | ORAL | Status: DC

## 2015-12-24 MED ORDER — POLYETHYLENE GLYCOL 3350 17 GM/SCOOP PO POWD
0.5000 | Freq: Once | ORAL | Status: AC
  Administered 2015-12-25: 127.5 g via ORAL
  Filled 2015-12-24: qty 255

## 2015-12-24 MED ORDER — ARFORMOTEROL TARTRATE 15 MCG/2ML IN NEBU
15.0000 ug | INHALATION_SOLUTION | Freq: Two times a day (BID) | RESPIRATORY_TRACT | Status: DC | PRN
  Administered 2015-12-25: 15 ug via RESPIRATORY_TRACT
  Filled 2015-12-24: qty 2

## 2015-12-24 MED ORDER — HEPARIN SODIUM (PORCINE) 5000 UNIT/ML IJ SOLN
5000.0000 [IU] | Freq: Two times a day (BID) | INTRAMUSCULAR | Status: DC
Start: 2015-12-24 — End: 2015-12-26
  Administered 2015-12-24 – 2015-12-25 (×5): 5000 [IU] via SUBCUTANEOUS
  Filled 2015-12-24 (×6): qty 1

## 2015-12-24 MED ORDER — DIAZEPAM 1 MG/ML PO SOLN: 1.0000 mg | Freq: Three times a day (TID) | ORAL | Status: DC | PRN

## 2015-12-24 MED ORDER — KETOROLAC TROMETHAMINE 15 MG/ML IJ SOLN
15.0000 mg | Freq: Once | INTRAMUSCULAR | Status: AC
  Administered 2015-12-24: 15 mg via INTRAVENOUS
  Filled 2015-12-24: qty 1

## 2015-12-24 NOTE — Consult Note (Signed)
ORTHOPAEDIC CONSULTATION  REQUESTING PHYSICIAN: Cristal Ford, DO  Chief Complaint: Right subtrochanteric femur fracture  HPI: Renee [name] is a 30 y.o. female who presents with a subtrochanteric femur fracture. Patient is a very small, approximately 60 pounds, frail woman, with cerebral palsy and mental retardation and hydrocephalus seizure disorder who is being cared for at home with home health aids. Patient's hips are flexed at 100 knees are flexed and in a windswept direction to the left. Apparently her right lower extremity was twisted externally to rotate the patient and this caused a spiral fracture to the subtrochanteric region on the right.  Past Medical History  Diagnosis Date  . Cerebral palsy ([place])   . Mental retardation   . Hydrocephalus   . Seizure disorder ([place])   . Osteopenia   . Hip dysplasia     bilateral  . DNR (do not resuscitate)   . DNI (do not intubate)   . Scoliosis   . Asthma     breathing treatment  . DNR (do not resuscitate) 04/28/2014  . UTI (urinary tract infection)    Past Surgical History  Procedure Laterality Date  . Spinal fusion  2005  . 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 Laparoscopic nissen fundoplication    . Gastrostomy tube placement      at same time as nissen  . Tympanostomy tube placement    . Ileopsoas release Bilateral     Dr Susy Frizzle  . Tenotomy adductor / hamstring closed Bilateral 04/25/00   Social History   Social History  . Marital Status: Single    Spouse Name: N/A  . Number of Children: 0  . Years of Education: N/A   Occupational History  . disabled    Social History Main Topics  . Smoking status: Never Smoker   . Smokeless tobacco: Never Used  . Alcohol Use: No  . Drug Use: No  . Sexual Activity: No   Other Topics Concern  . None   Social History Narrative   [place] requires total care in all aspects of daily living. She lives with her mother, who receives some help from a home health aide for [place].  [place] enjoys watching TV.   Family History  Problem Relation Age of Onset  . Breast cancer Maternal Grandmother   . Heart disease Maternal Grandfather   . Thyroid disease Maternal Grandfather   . Thyroid disease Maternal Aunt    - negative except otherwise stated in the family history section Allergies  Allergen Reactions  . Other     Paper and adhesive tape - blisters  . Penicillins Rash    Has patient had a PCN reaction causing immediate rash, facial/tongue/throat swelling, SOB or lightheadedness with hypotension: NO Has patient had a PCN reaction causing severe rash involving mucus membranes or skin necrosis: NO Has patient had a PCN reaction that required hospitalization: NO Has patient had a PCN reaction occurring within the last 10 years: NO If all of the above answers are "NO", then may proceed with Cephalosporin use.   . Latex Rash and Other (See Comments)    blisters   Prior to Admission medications   Medication Sig Start Date End Date Taking? Authorizing Provider  albuterol (PROVENTIL) (2.5 MG/3ML) 0.083% nebulizer solution Take 3 mLs (2.5 mg total) by nebulization every 6 (six) hours as needed for wheezing or shortness of breath. 09/09/15  Yes [name], MD  ALPRAZolam Duanne Moron) 0.25 MG tablet Give 1/4 to  1/2 tablet up to twice per day as needed for severe irritability 06/20/15  Yes Rockwell Germany, NP  arformoterol (BROVANA) 15 MCG/2ML NEBU Take 2 mLs (15 mcg total) by nebulization 2 (two) times daily. Patient taking differently: Take 15 mcg by nebulization as needed.  09/09/15  Yes [name], MD  baclofen (LIORESAL) 10 MG tablet TAKE 2 TABLETS AT 7AM, 1 AT 11AM, 2 AT 3PM, AND 2 AT 8PM. 07/04/15  Yes Rockwell Germany, NP  budesonide (PULMICORT) 0.25 MG/2ML nebulizer solution Take 2 mLs (0.25 mg total) by nebulization 2 (two) times daily. 12/14/15  Yes [name], MD  diazepam (VALIUM) 1 MG/ML solution Give 42ml every 8 hours as needed for agitation 11/10/15  Yes Rockwell Germany, NP  medroxyPROGESTERone (DEPO-PROVERA) 150 MG/ML injection INJECT 1ML INTO THE MUSCLE EVERY 3 MONTHS. 11/09/15  Yes Estill Dooms, NP  Nutritional Supplements (FEEDING SUPPLEMENT, JEVITY 1.5 CAL/FIBER,) LIQD Place 237 mLs into feeding tube 4 (four) times daily. 1 can at 0700, 1100, 1500, and 2000.   Yes Historical Provider, MD  polyethylene glycol powder (MIRALAX) powder Take 1/2 capful every day or every other day - via peg tube -  as needed for constipation 01/25/15  Yes Lafayette Dragon, MD  primidone (MYSOLINE) 50 MG tablet TAKE 4 TABLETS EVERY MORNING,4 TABLETS IN THE AFTERNOON, AND 4 TABLETS IN THE EVENING. 11/28/15  Yes Rockwell Germany, NP  ranitidine (ZANTAC) 150 MG/10ML syrup Place 20 mLs (300 mg total) into feeding tube 2 (two) times daily. 05/18/15  Yes Manus Gunning, MD  [name] (RISPERDAL) 0.5 MG tablet Give 3 tablets via G-tube at 7AM, 3 tablets at 11AM, 3 tablets at 3PM and 1 tablet via G-tube at bedtime 11/10/15  Yes Rockwell Germany, NP  tiZANidine (ZANAFLEX) 2 MG tablet Crush 2 tablets and give in G-tube at bedtime for spasms. May repeat 1/2 tablet during the night if needed for spasms 11/10/15  Yes Rockwell Germany, NP   Dg Hip Unilat  With Pelvis 2-3 Views Right  12/23/2015  CLINICAL DATA:  Abnormal displacement of the right leg. Initial encounter. EXAM: DG HIP (WITH OR WITHOUT PELVIS) 2-3V RIGHT COMPARISON:  None. FINDINGS: There is a displaced fracture extending through the right proximal femoral diaphysis, with more than 1 shaft width displacement. Displacement, shortening and angulation vary depending on positioning. Significant surrounding soft tissue swelling is noted. The right femoral head remains seated at the acetabulum. Fusion hardware is noted along the thoracolumbar spine and sacroiliac joints. IMPRESSION: Displaced fracture extends through the right proximal femoral diaphysis, with more than 1 shaft width displacement. Displacement, shortening and  angulation vary depending on positioning. Electronically Signed   By: Garald Balding M.D.   On: 12/23/2015 21:19   - pertinent xrays, CT, MRI studies were reviewed and independently interpreted  Positive ROS: All other systems have been reviewed and were otherwise negative with the exception of those mentioned in the HPI and as above.  Physical Exam: General: Mild distress baseline per her mother Cardiovascular: No pedal edema Respiratory: No cyanosis, no use of accessory musculature GI: No organomegaly, abdomen is soft and non-tender Skin: No lesions in the area of chief complaint Neurologic: Sensation intact distally Psychiatric: Patient is not competent for consent with normal mood and affect Lymphatic: No axillary or cervical lymphadenopathy  MUSCULOSKELETAL:  On examination patient has a fixed flexion deformity to the right hip of 100 fixed flexion deformity to the right knee of 100 with a windswept deformity of both lower extremities to  the left. There is no ecchymosis and or bruising in her hand. Radiographs shows a spiral fracture subtrochanteric the right most likely due to the twisting injury.  Assessment: Assessment: Spiral fracture right femur in a 60 pound woman with cerebral palsy and mental retardation.  Plan: Plan: Discussed with the mother options regarding surgical intervention versus conservative treatment with bracing. Patient's mother states that she would like to proceed with bracing she is not interested in any aggressive surgical intervention. We will contact [employer] to see if a brace can be arranged to provide stability for the hip to a waist band attachment.  Thank you for the consult and the opportunity to see Ms. Rayford Halsted, MD [employer] [phone] 8:17 AM

## 2015-12-24 NOTE — Progress Notes (Signed)
Initial Nutrition Assessment  DOCUMENTATION CODES:   Underweight  INTERVENTION:   -Initiate bolus feedings of 237 ml (1 can) Jevity 1.5 via PEG 4 times daily  Tube feeding regimen provides 1420 kcal (100% of needs), 60 grams of protein, and 720 ml of H2O.   NUTRITION DIAGNOSIS:   Inadequate oral intake related to inability to eat as evidenced by NPO status.  GOAL:   Patient will meet greater than or equal to 90% of their needs  MONITOR:   Labs, Weight trends, TF tolerance, Skin, I & O's  REASON FOR ASSESSMENT:   Consult Enteral/tube feeding initiation and management  ASSESSMENT:   Renee [name] is a 30 y.o. female with medical history significant of cerebral palsy, mental retardation, hydrocephalus, seizure disorder, osteopenia, bilateral hip dysplasia, scoliosis, asthma who was brought to the emergency department by her mother after she noticed that the patient's right hip was hyperextended and not feeling normal.  Pt admitted with closed rt hip fx.   Hx obtained from pt mother at bedside, who is very knowledgeable. Pt receives nothing by mouth and has been receiving TF via PEG for the past 10-12 years. Feedings are administered by home health agency. Home TF regimen is 1 can (8 oz) of Jevity 1.5 mixed with 4 oz of water ("due to thickness") and 2 oz free water flush before and after each administration. Complete regimen provides 1420 kcals, 60 grams, and 1200 ml fluid daily (which provides 100% of estimated kcal and protein needs).   Per pt mother, weight has been stable. She reports UBW of 60#, which she has maintained for the past several years.   Pt mother reports pt is very thin and frail at baseline. Pt spends time both in her wheelchair and in the bed at home. Nutrition-focused physical exam deferred at this time.   Case discussed with RN. She confirmed formula has arrived to the unit.   Labs reviewed.   Diet Order:  NPO  Skin:  Reviewed, no issues  Last BM:   12/24/15  Height:   Ht Readings from Last 1 Encounters:  05/30/15 4' (1.219 m)    Weight:   Wt Readings from Last 1 Encounters:  12/24/15 60 lb (27.216 kg)    Ideal Body Weight:  36.3 kg  BMI:  Body mass index is 18.32 kg/(m^2).  Estimated Nutritional Needs:   Kcal:  1250-1450  Protein:  55-65 grams  Fluid:  1.2-1.4 L  EDUCATION NEEDS:   No education needs identified at this time  Dillyn Joaquin A. Jimmye Norman, RD, LDN, CDE Pager: [phone] After hours Pager: [phone]

## 2015-12-24 NOTE — Clinical Social Work Note (Signed)
Clinical Social Work Assessment  Patient Details  Name: Renee [name] MRN: [id number] Date of Birth: Jul 25, 1985  Date of referral:  12/24/15               Reason for consult:  Discharge Planning                Permission sought to share information with:  Facility Sport and exercise psychologist, Family Supports Permission granted to share information::  No (Patient unable to give consent)  Name::     [facility]  Agency::     Relationship::     Contact Information:     Housing/Transportation Living arrangements for the past 2 months:  Single Family Home Source of Information:  Parent Patient Interpreter Needed:  None Criminal Activity/Legal Involvement Pertinent to Current Situation/Hospitalization:  No - Comment as needed Significant Relationships:  Parents Lives with:  Parents Do you feel safe going back to the place where you live?  Yes Need for family participation in patient care:  Yes (Comment)  Care giving concerns:  The patient's mother shares that she plans to bring the patient home at discharge. She has addressed her concerns regarding the broken hip with the home health agency that looks after the patient [facility]).   Social Worker assessment / plan:  CSW met with patient and patient's mom at bedside to complete assessment. The patient is resting in bed. Mom at bedside shares the events the took place PTA. She states that she returned home from work and noticed that the patient's leg was not normal when she was changing the patient. The home health agency did not mention any concerns to the mom upon her arrival but when changing the patient the patient's mother immediately knew something was wrong. The mother shares he frustrations with the home health agency. She prides herself in having taken care of the patient for 28 years. She shares that she feels something like this could have been avoided. The mom suspects that the injury may have happened as a result of the patient being turned  in bed, as she states, "she is very fragile." CSW discussed disposition options with mom. She states that she will be keeping the patient at home as long as she is able. No SNF will be needed at time of DC. Mom states ambulance transport may be needed at time of DC.  Employment status:  Disabled (Comment on whether or not currently receiving Disability) Insurance information:  Managed Care PT Recommendations:  Not assessed at this time (Per mom, the patient is mostly bed bound. PTA to admission she was able to sit in motorized wheel chair) Information / Referral to community resources:  Other (Comment Required) (No referrals made. CSW will remain available for support if needed.)  Patient/Family's Response to care:  The patient's mother appears happy with the care the patient is receiving here at [facility].  Patient/Family's Understanding of and Emotional Response to Diagnosis, Current Treatment, and Prognosis:  The patient's mother has a good understanding of the patients injury and plan of care. She states that surgery is not planned and that there isn't much that can be done for the patient. She appears to have a good understanding of the patient's post DC needs.  Emotional Assessment Appearance:  Appears younger than stated age Attitude/Demeanor/Rapport:  Unable to Assess Affect (typically observed):  Unable to Assess Orientation:    Alcohol / Substance use:  Not Applicable Psych involvement (Current and /or in the community):  No (Comment)  Discharge Needs  Concerns to be addressed:  Discharge Planning Concerns Readmission within the last 30 days:  No Current discharge risk:  Cognitively Impaired, Chronically ill, Physical Impairment Barriers to Discharge:  Continued Medical Work up   [place], LCSW 12/24/2015, 11:22 AM

## 2015-12-24 NOTE — Progress Notes (Signed)
PROGRESS NOTE    Renee [name]  [id number] DOB: 05/24/1986 DOA: 12/23/2015 PCP: Purvis Kilts, MD   Brief Narrative:  HPI on 12/23/2015 by Dr. Gerri Lins Renee [name] is a 30 y.o. female with medical history significant of cerebral palsy, mental retardation, hydrocephalus, seizure disorder, osteopenia, bilateral hip dysplasia, scoliosis, asthma who was brought to the emergency department by her mother after she noticed that the patient's right hip was hyperextended and not feeling normal.  Per patient's mother, she works during the daytime and the patient is taking care by home health nurses. She states that she did not get any incident report from the home health nursing staff. Her mother went to change the patient's diaper and noticed that her right hip was not feeling normal. The patient also seemed to be in a lot of discomfort. She had surgery on her right hip about 15 years ago.  Assessment & Plan   Closed right hip fracture -Right Hip fracture: displaced fracture extends through the right proximal femoral diaphysis  -Orthopedic surgery consulted and appreciated, Dr. Sharol Given- conservative management with bracing -Per mother, does not want patient to have surgery.  Asthma -Currently stable, continue home medications  History of cerebral palsy/intellectual disabilities -Stable  Leukocytosis -WBC trending downward -Likely reactive to the above  DVT Prophylaxis   Code Status: DO NOT RESUSCITATE  Family Communication: Mother at bedside  Disposition Plan: Admitted, will likely discharge in the next 1-2 days  Consultants Orthopedic surgery  Procedures  None  Antibiotics   Anti-infectives    None      Subjective:   Renee [name] seen and examined today.  Not communicative or verbal. Mother at bedside. 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  Objective:   Filed Vitals:   12/24/15 0000 12/24/15 0043 12/24/15 0500 12/24/15 0944  BP: 112/74  118/75 101/64   Pulse: 80 88 83   Temp:  98 F (36.7 C) 97.7 F (36.5 C)   TempSrc:  Axillary Axillary   Resp: 18 18 18    Weight:  27.216 kg (60 lb)    SpO2: 95% 96% 91% 92%    Intake/Output Summary (Last 24 hours) at 12/24/15 1150 Last data filed at 12/24/15 0900  Gross per 24 hour  Intake  315.5 ml  Output      0 ml  Net  315.5 ml   Filed Weights   12/24/15 0043  Weight: 27.216 kg (60 lb)    Exam  General: Cerebral palsy, MR  HEENT: NCAT, mucous membranes dry   Cardiovascular: S1 S2 auscultated, no murmurs, RRR  Respiratory: Clear to auscultation bilaterally   Abdomen: Soft, nontender, nondistended, + bowel sounds. +Peg   Extremities: warm dry without cyanosis clubbing or edema- flexed hips, contractures   Neuro: unable to assess due to MR and cerebral palsy  Data Reviewed: I have personally reviewed following labs and imaging studies  CBC:  Recent Labs Lab 12/23/15 2229 12/24/15 0643  WBC 13.3* 7.8  NEUTROABS 11.9* 5.1  HGB 15.6* 15.3*  HCT 48.0* 47.0*  MCV 101.5* 100.4*  PLT 202 [id number]   Basic Metabolic Panel:  Recent Labs Lab 12/23/15 2229 12/24/15 0643  NA 136 137  K 4.1 4.0  CL 100* 100*  CO2 28 25  GLUCOSE 138* 93  BUN 6 <5*  CREATININE <0.30* <0.30*  CALCIUM 9.4 9.3  MG 2.2  --   PHOS 3.4  --    GFR: CrCl cannot be calculated (Unknown ideal weight.).  Liver Function Tests:  Recent Labs Lab 12/24/15 0643  AST 25  ALT 27  ALKPHOS 174*  BILITOT 0.5  PROT 7.1  ALBUMIN 3.6   No results for input(s): LIPASE, AMYLASE in the last 168 hours. No results for input(s): AMMONIA in the last 168 hours. Coagulation Profile:  Recent Labs Lab 12/23/15 2229  INR 1.18   Cardiac Enzymes: No results for input(s): CKTOTAL, CKMB, CKMBINDEX, TROPONINI in the last 168 hours. BNP (last 3 results) No results for input(s): PROBNP in the last 8760 hours. HbA1C: No results for input(s): HGBA1C in the last 72 hours. CBG: No results for  input(s): GLUCAP in the last 168 hours. Lipid Profile: No results for input(s): CHOL, HDL, LDLCALC, TRIG, CHOLHDL, LDLDIRECT in the last 72 hours. Thyroid Function Tests: No results for input(s): TSH, T4TOTAL, FREET4, T3FREE, THYROIDAB in the last 72 hours. Anemia Panel: No results for input(s): VITAMINB12, FOLATE, FERRITIN, TIBC, IRON, RETICCTPCT in the last 72 hours. Urine analysis:    Component Value Date/Time   COLORURINE RED* 04/26/2014 1529   APPEARANCEUR TURBID* 04/26/2014 1529   LABSPEC 1.017 04/26/2014 1529   PHURINE 6.0 04/26/2014 1529   GLUCOSEU NEGATIVE 04/26/2014 1529   HGBUR LARGE* 04/26/2014 1529   BILIRUBINUR SMALL* 04/26/2014 1529   KETONESUR NEGATIVE 04/26/2014 1529   PROTEINUR >300* 04/26/2014 1529   UROBILINOGEN 1.0 04/26/2014 1529   NITRITE NEGATIVE 04/26/2014 1529   LEUKOCYTESUR LARGE* 04/26/2014 1529   Sepsis Labs: @LABRCNTIP (procalcitonin:4,lacticidven:4)  )No results found for this or any previous visit (from the past 240 hour(s)).    Radiology Studies: Dg Hip Unilat  With Pelvis 2-3 Views Right  12/23/2015  CLINICAL DATA:  Abnormal displacement of the right leg. Initial encounter. EXAM: DG HIP (WITH OR WITHOUT PELVIS) 2-3V RIGHT COMPARISON:  None. FINDINGS: There is a displaced fracture extending through the right proximal femoral diaphysis, with more than 1 shaft width displacement. Displacement, shortening and angulation vary depending on positioning. Significant surrounding soft tissue swelling is noted. The right femoral head remains seated at the acetabulum. Fusion hardware is noted along the thoracolumbar spine and sacroiliac joints. IMPRESSION: Displaced fracture extends through the right proximal femoral diaphysis, with more than 1 shaft width displacement. Displacement, shortening and angulation vary depending on positioning. Electronically Signed   By: Garald Balding M.D.   On: 12/23/2015 21:19     Scheduled Meds: . baclofen  10-20 mg Oral QID    . budesonide  0.25 mg Nebulization BID  . feeding supplement (JEVITY 1.5 CAL/FIBER)  237 mL Per Tube QID  . heparin  5,000 Units Subcutaneous Q12H  . polyethylene glycol powder  0.5 Container Oral Once  . primidone  50 mg Oral Q6H  . ranitidine  300 mg Per Tube BID  . [name]  0.5 mg Oral QID  . sodium chloride flush  3 mL Intravenous Q12H  . tiZANidine  2 mg Oral QHS,MR X 1   Continuous Infusions: . sodium chloride 50 mL/hr (12/24/15 0145)     LOS: 1 day   Time Spent in minutes   30 minutes  Jceon Alverio D.O. on 12/24/2015 at 11:50 AM  Between 7am to 7pm - Pager - [phone]  After 7pm go to [email or web address] - password [email or web address]  And look for the night coverage person covering for me after hours  [facility] Office  [phone]

## 2015-12-25 DIAGNOSIS — Z66 Do not resuscitate: Secondary | ICD-10-CM | POA: Diagnosis not present

## 2015-12-25 DIAGNOSIS — J45909 Unspecified asthma, uncomplicated: Secondary | ICD-10-CM | POA: Diagnosis not present

## 2015-12-25 DIAGNOSIS — S72001A Fracture of unspecified part of neck of right femur, initial encounter for closed fracture: Secondary | ICD-10-CM | POA: Diagnosis not present

## 2015-12-25 DIAGNOSIS — S7221XA Displaced subtrochanteric fracture of right femur, initial encounter for closed fracture: Secondary | ICD-10-CM | POA: Diagnosis not present

## 2015-12-25 DIAGNOSIS — G808 Other cerebral palsy: Secondary | ICD-10-CM | POA: Diagnosis not present

## 2015-12-25 LAB — COMPREHENSIVE METABOLIC PANEL
ALBUMIN: 2.8 g/dL — AB (ref 3.5–5.0)
ALK PHOS: 155 U/L — AB (ref 38–126)
ALT: 16 U/L (ref 14–54)
AST: 26 U/L (ref 15–41)
Anion gap: 12 (ref 5–15)
BUN: 6 mg/dL (ref 6–20)
CALCIUM: 8.8 mg/dL — AB (ref 8.9–10.3)
CO2: 20 mmol/L — ABNORMAL LOW (ref 22–32)
Chloride: 107 mmol/L (ref 101–111)
GLUCOSE: 69 mg/dL (ref 65–99)
POTASSIUM: 4.2 mmol/L (ref 3.5–5.1)
Sodium: 139 mmol/L (ref 135–145)
TOTAL PROTEIN: 6.1 g/dL — AB (ref 6.5–8.1)

## 2015-12-25 LAB — CBC WITH DIFFERENTIAL/PLATELET
BASOS ABS: 0 10*3/uL (ref 0.0–0.1)
Basophils Relative: 0 %
Eosinophils Absolute: 0.3 10*3/uL (ref 0.0–0.7)
Eosinophils Relative: 3 %
HEMATOCRIT: 40.4 % (ref 36.0–46.0)
HEMOGLOBIN: 12.9 g/dL (ref 12.0–15.0)
LYMPHS PCT: 16 %
Lymphs Abs: 1.4 10*3/uL (ref 0.7–4.0)
MCH: 32.2 pg (ref 26.0–34.0)
MCHC: 31.9 g/dL (ref 30.0–36.0)
MCV: 100.7 fL — AB (ref 78.0–100.0)
MONO ABS: 1 10*3/uL (ref 0.1–1.0)
MONOS PCT: 11 %
NEUTROS ABS: 6.1 10*3/uL (ref 1.7–7.7)
Neutrophils Relative %: 70 %
Platelets: 171 10*3/uL (ref 150–400)
RBC: 4.01 MIL/uL (ref 3.87–5.11)
RDW: 13 % (ref 11.5–15.5)
WBC: 8.7 10*3/uL (ref 4.0–10.5)

## 2015-12-25 LAB — GLUCOSE, CAPILLARY
GLUCOSE-CAPILLARY: 100 mg/dL — AB (ref 65–99)
GLUCOSE-CAPILLARY: 94 mg/dL (ref 65–99)
GLUCOSE-CAPILLARY: 95 mg/dL (ref 65–99)
GLUCOSE-CAPILLARY: 148 mg/dL — AB (ref 65–99)
Glucose-Capillary: 140 mg/dL — ABNORMAL HIGH (ref 65–99)
Glucose-Capillary: 117 mg/dL — ABNORMAL HIGH (ref 65–99)

## 2015-12-25 NOTE — Progress Notes (Signed)
PROGRESS NOTE    Renee [name]  [id number] DOB: 07-22-1986 DOA: 12/23/2015 PCP: Purvis Kilts, MD   Brief Narrative:  HPI on 12/23/2015 by Dr. Gerri Lins Renee [name] is a 30 y.o. female with medical history significant of cerebral palsy, mental retardation, hydrocephalus, seizure disorder, osteopenia, bilateral hip dysplasia, scoliosis, asthma who was brought to the emergency department by her mother after she noticed that the patient's right hip was hyperextended and not feeling normal.  Per patient's mother, she works during the daytime and the patient is taking care by home health nurses. She states that she did not get any incident report from the home health nursing staff. Her mother went to change the patient's diaper and noticed that her right hip was not feeling normal. The patient also seemed to be in a lot of discomfort. She had surgery on her right hip about 15 years ago.  Assessment & Plan   Closed right hip fracture -Right Hip fracture: displaced fracture extends through the right proximal femoral diaphysis  -Orthopedic surgery consulted and appreciated, Dr. Sharol Given- conservative management with bracing -Per mother, does not want patient to have surgery. -Continue pain control -Pending brace -CM consulted as mother would like patient to return home.  She has caregivers.  Asthma -Currently stable, continue home medications  History of cerebral palsy/intellectual disabilities -Stable  Leukocytosis -resolved -Likely reactive to the above  Nutrition/Underweight -nutrition consulted and restarted Jevity  DVT Prophylaxis   Code Status: DO NOT RESUSCITATE  Family Communication: Mother at bedside  Disposition Plan: Admitted, will likely discharge in the next 1-2 days  Consultants Orthopedic surgery  Procedures  None  Antibiotics   Anti-infectives    None      Subjective:   Renee [name] seen and examined today.  Not communicative or verbal. Mother at  bedside. 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  Objective:   Filed Vitals:   12/24/15 1949 12/25/15 0200 12/25/15 0500 12/25/15 0535  BP: 111/74   97/61  Pulse: 105   99  Temp: 98.2 F (36.8 C)   98.3 F (36.8 C)  TempSrc: Oral   Axillary  Resp: 18 16  18   Height:      Weight:   29.03 kg (64 lb)   SpO2: 90%   91%    Intake/Output Summary (Last 24 hours) at 12/25/15 1035 Last data filed at 12/25/15 0600  Gross per 24 hour  Intake   1200 ml  Output      1 ml  Net   1199 ml   Filed Weights   12/24/15 0043 12/24/15 1400 12/25/15 0500  Weight: 27.216 kg (60 lb) 27.216 kg (60 lb) 29.03 kg (64 lb)    Exam  General: Cerebral palsy, MR  HEENT: NCAT, mucous membranes moist  Cardiovascular: S1 S2 auscultated, no murmurs, RRR  Respiratory: Clear to auscultation bilaterally   Abdomen: Soft, nontender, nondistended, + bowel sounds. +Peg   Extremities: warm dry without cyanosis clubbing or edema- flexed hips, contractures   Neuro: unable to assess due to MR and cerebral palsy  Data Reviewed: I have personally reviewed following labs and imaging studies  CBC:  Recent Labs Lab 12/23/15 2229 12/24/15 0643 12/25/15 0443  WBC 13.3* 7.8 8.7  NEUTROABS 11.9* 5.1 6.1  HGB 15.6* 15.3* 12.9  HCT 48.0* 47.0* 40.4  MCV 101.5* 100.4* 100.7*  PLT 202 198 [id number]   Basic Metabolic Panel:  Recent Labs Lab 12/23/15 2229 12/24/15 0643 12/25/15 0443  NA 136 137 139  K 4.1 4.0 4.2  CL 100* 100* 107  CO2 28 25 20*  GLUCOSE 138* 93 69  BUN 6 <5* 6  CREATININE <0.30* <0.30* <0.30*  CALCIUM 9.4 9.3 8.8*  MG 2.2  --   --   PHOS 3.4  --   --    GFR: CrCl cannot be calculated (Patient has no serum creatinine result on file.). Liver Function Tests:  Recent Labs Lab 12/24/15 0643 12/25/15 0443  AST 25 26  ALT 27 16  ALKPHOS 174* 155*  BILITOT 0.5 <0.1*  PROT 7.1 6.1*  ALBUMIN 3.6 2.8*   No results for input(s): LIPASE, AMYLASE in the last 168  hours. No results for input(s): AMMONIA in the last 168 hours. Coagulation Profile:  Recent Labs Lab 12/23/15 2229  INR 1.18   Cardiac Enzymes: No results for input(s): CKTOTAL, CKMB, CKMBINDEX, TROPONINI in the last 168 hours. BNP (last 3 results) No results for input(s): PROBNP in the last 8760 hours. HbA1C: No results for input(s): HGBA1C in the last 72 hours. CBG:  Recent Labs Lab 12/24/15 1611 12/24/15 1954 12/24/15 2321 12/25/15 0450 12/25/15 0806  GLUCAP 128* 161* 100* 94 140*   Lipid Profile: No results for input(s): CHOL, HDL, LDLCALC, TRIG, CHOLHDL, LDLDIRECT in the last 72 hours. Thyroid Function Tests: No results for input(s): TSH, T4TOTAL, FREET4, T3FREE, THYROIDAB in the last 72 hours. Anemia Panel: No results for input(s): VITAMINB12, FOLATE, FERRITIN, TIBC, IRON, RETICCTPCT in the last 72 hours. Urine analysis:    Component Value Date/Time   COLORURINE YELLOW 12/24/2015 1220   APPEARANCEUR CLOUDY* 12/24/2015 1220   LABSPEC 1.012 12/24/2015 1220   PHURINE 7.0 12/24/2015 1220   GLUCOSEU NEGATIVE 12/24/2015 1220   HGBUR NEGATIVE 12/24/2015 1220   BILIRUBINUR NEGATIVE 12/24/2015 1220   KETONESUR NEGATIVE 12/24/2015 1220   PROTEINUR NEGATIVE 12/24/2015 1220   UROBILINOGEN 1.0 04/26/2014 1529   NITRITE NEGATIVE 12/24/2015 1220   LEUKOCYTESUR LARGE* 12/24/2015 1220   Sepsis Labs: @LABRCNTIP (procalcitonin:4,lacticidven:4)  )No results found for this or any previous visit (from the past 240 hour(s)).    Radiology Studies: Dg Hip Unilat  With Pelvis 2-3 Views Right  12/23/2015  CLINICAL DATA:  Abnormal displacement of the right leg. Initial encounter. EXAM: DG HIP (WITH OR WITHOUT PELVIS) 2-3V RIGHT COMPARISON:  None. FINDINGS: There is a displaced fracture extending through the right proximal femoral diaphysis, with more than 1 shaft width displacement. Displacement, shortening and angulation vary depending on positioning. Significant surrounding soft  tissue swelling is noted. The right femoral head remains seated at the acetabulum. Fusion hardware is noted along the thoracolumbar spine and sacroiliac joints. IMPRESSION: Displaced fracture extends through the right proximal femoral diaphysis, with more than 1 shaft width displacement. Displacement, shortening and angulation vary depending on positioning. Electronically Signed   By: Garald Balding M.D.   On: 12/23/2015 21:19     Scheduled Meds: . baclofen  10 mg Oral Daily  . baclofen  20 mg Oral Daily  . baclofen  20 mg Oral Daily  . baclofen  20 mg Oral Daily  . budesonide  0.25 mg Nebulization BID  . feeding supplement (JEVITY 1.5 CAL/FIBER)  237 mL Per Tube QID  . heparin  5,000 Units Subcutaneous Q12H  . polyethylene glycol powder  0.5 Container Oral Once  . primidone  50 mg Oral 3 times per day  . ranitidine  300 mg Per Tube BID  . [name]  0.5 mg Oral QHS  .  [name]  1.5 mg Oral Daily  . [name]  1.5 mg Oral Daily  . [name]  1.5 mg Oral QHS  . sodium chloride flush  3 mL Intravenous Q12H  . tiZANidine  2 mg Oral QHS,MR X 1   Continuous Infusions: . sodium chloride 50 mL/hr at 12/25/15 0600     LOS: 2 days   Time Spent in minutes   30 minutes  Makinley Muscato D.O. on 12/25/2015 at 10:35 AM  Between 7am to 7pm - Pager - [phone]  After 7pm go to [email or web address] - password [email or web address]  And look for the night coverage person covering for me after hours  [facility] Office  [phone]

## 2015-12-26 DIAGNOSIS — S72001A Fracture of unspecified part of neck of right femur, initial encounter for closed fracture: Secondary | ICD-10-CM | POA: Diagnosis not present

## 2015-12-26 DIAGNOSIS — S7291XA Unspecified fracture of right femur, initial encounter for closed fracture: Secondary | ICD-10-CM

## 2015-12-26 DIAGNOSIS — J45909 Unspecified asthma, uncomplicated: Secondary | ICD-10-CM | POA: Diagnosis not present

## 2015-12-26 DIAGNOSIS — G808 Other cerebral palsy: Secondary | ICD-10-CM | POA: Diagnosis not present

## 2015-12-26 DIAGNOSIS — S7221XA Displaced subtrochanteric fracture of right femur, initial encounter for closed fracture: Secondary | ICD-10-CM | POA: Diagnosis not present

## 2015-12-26 LAB — CBC WITH DIFFERENTIAL/PLATELET
BASOS ABS: 0 10*3/uL (ref 0.0–0.1)
Basophils Relative: 0 %
EOS ABS: 0.1 10*3/uL (ref 0.0–0.7)
EOS PCT: 2 %
HCT: 41.4 % (ref 36.0–46.0)
Hemoglobin: 13.4 g/dL (ref 12.0–15.0)
LYMPHS ABS: 1.5 10*3/uL (ref 0.7–4.0)
Lymphocytes Relative: 16 %
MCH: 32.4 pg (ref 26.0–34.0)
MCHC: 32.4 g/dL (ref 30.0–36.0)
MCV: 100 fL (ref 78.0–100.0)
MONOS PCT: 13 %
Monocytes Absolute: 1.2 10*3/uL — ABNORMAL HIGH (ref 0.1–1.0)
NEUTROS ABS: 6.5 10*3/uL (ref 1.7–7.7)
NEUTROS PCT: 69 %
PLATELETS: 193 10*3/uL (ref 150–400)
RBC: 4.14 MIL/uL (ref 3.87–5.11)
RDW: 12.9 % (ref 11.5–15.5)
WBC: 9.4 10*3/uL (ref 4.0–10.5)

## 2015-12-26 LAB — COMPREHENSIVE METABOLIC PANEL
ALT: 19 U/L (ref 14–54)
AST: 18 U/L (ref 15–41)
Albumin: 3.1 g/dL — ABNORMAL LOW (ref 3.5–5.0)
Alkaline Phosphatase: 141 U/L — ABNORMAL HIGH (ref 38–126)
Anion gap: 6 (ref 5–15)
CHLORIDE: 104 mmol/L (ref 101–111)
CO2: 28 mmol/L (ref 22–32)
Calcium: 8.6 mg/dL — ABNORMAL LOW (ref 8.9–10.3)
Glucose, Bld: 109 mg/dL — ABNORMAL HIGH (ref 65–99)
Potassium: 3.5 mmol/L (ref 3.5–5.1)
SODIUM: 138 mmol/L (ref 135–145)
Total Bilirubin: 0.5 mg/dL (ref 0.3–1.2)
Total Protein: 6.1 g/dL — ABNORMAL LOW (ref 6.5–8.1)

## 2015-12-26 LAB — GLUCOSE, CAPILLARY
Glucose-Capillary: 97 mg/dL (ref 65–99)
Glucose-Capillary: 150 mg/dL — ABNORMAL HIGH (ref 65–99)

## 2015-12-26 MED ORDER — HYDROCODONE-ACETAMINOPHEN 5-325 MG PO TABS: 1.0000 | ORAL_TABLET | Freq: Four times a day (QID) | ORAL | Status: DC | PRN

## 2015-12-26 NOTE — Care Management Note (Signed)
Case Management Note  Patient Details  Name: Maree Legall MRN: [id number] Date of Birth: 04/07/1986  Subjective/Objective:     30 yr old female admitted s/p right  Closed hip fracture, no surgical intervention planned.               Action/Plan:  CM spoke with patient's mom at the bedside concerning home health and DME needs. Mrs. Working states she believes they have everything needed at home. She states she has called [facility] to resume services and to send a care provider than person she had previously.  Emotional support provided.    Expected Discharge Date:  12/26/15               Expected Discharge Plan:  [place]  In-House Referral:     Discharge planning Services  CM Consult  Post Acute Care Choice:  NA, Resumption of Svcs/PTA Provider Choice offered to:  Parent  DME Arranged:  N/A DME Agency:  NA  HH Arranged:    HH Agency:  Surveyor, quantity  Status of Service:  Completed, signed off  Medicare Important Message Given:    Date Medicare IM Given:    Medicare IM give by:    Date Additional Medicare IM Given:    Additional Medicare Important Message give by:     If discussed at [place] of Stay Meetings, dates discussed:    Additional Comments:  Ninfa Meeker, RN 12/26/2015, 11:02 AM

## 2015-12-26 NOTE — Discharge Instructions (Signed)
Hip Fracture A hip fracture is a fracture of the upper part of your thigh bone (femur).  CAUSES A hip fracture is caused by a direct blow to the side of your hip. This is usually the result of a fall but can occur in other circumstances, such as an automobile accident. RISK FACTORS There is an increased risk of hip fractures in people with:  An unsteady walking pattern (gait) and those with conditions that contribute to poor balance, such as Parkinson's disease or dementia.  Osteopenia and osteoporosis.  Cancer that spreads to the leg bones.  Certain metabolic diseases. SYMPTOMS  Symptoms of hip fracture include:  Pain over the injured hip.  Inability to put weight on the leg in which the fracture occurred (although, some patients are able to walk after a hip fracture).  Toes and foot of the affected leg point outward when you lie down. DIAGNOSIS A physical exam can determine if a hip fracture is likely to have occurred. X-ray exams are needed to confirm the fracture and to look for other injuries. The X-ray exam can help to determine the type of hip fracture. Rarely, the fracture is not visible on an X-ray image and a CT scan or MRI will have to be done. TREATMENT  The treatment for a fracture is usually surgery. This means using a screw, nail, or rod to hold the bones in place.  HOME CARE INSTRUCTIONS Take all medicines as directed by your health care provider. SEEK MEDICAL CARE IF: Pain continues, even after taking pain medicine. MAKE SURE YOU:  Understand these instructions.   Will watch your condition.  Will get help right away if you are not doing well or get worse.   This information is not intended to replace advice given to you by your health care provider. Make sure you discuss any questions you have with your health care provider.   Document Released: 07/09/2005 Document Revised: 07/14/2013 Document Reviewed: 02/18/2013 Elsevier Interactive Patient Education 2016  Elsevier [employer]  

## 2015-12-26 NOTE — Progress Notes (Addendum)
Patient ID: Renee [name], female   DOB: 25-Dec-1985, 30 y.o.   MRN: [id number] Patient is awaiting a brace to be fabricated by bio- tech. I feel it safe for patient to be discharged to home at this time and bio- tech  fit the patient at home. I will follow-up in the office in 2 weeks.

## 2015-12-26 NOTE — Progress Notes (Signed)
D/c to home at 1333.  D/c instructions given to pt by float RN.  Karie Kirks, [place]

## 2015-12-26 NOTE — Progress Notes (Signed)
Orthopedic Tech Progress Note Patient Details:  Renee [name] Aug 24, 1985 [id number]  Ortho Devices Type of Ortho Device: Shoulder immobilizer Ortho Device/Splint Location: rle Ortho Device/Splint Interventions: Application   Renee [name] 12/26/2015, 10:48 AM

## 2015-12-26 NOTE — Progress Notes (Signed)
Orthopedic Tech Progress Note Patient Details:  Renee [name] November 12, 1985 [id number]  Ortho Devices Type of Ortho Device: Shoulder immobilizer Ortho Device/Splint Location: rle Ortho Device/Splint Interventions: Application   Renee [name] 12/26/2015, 10:50 AM

## 2015-12-26 NOTE — Progress Notes (Signed)
Discharge instructions given. Pt verbalized understanding and all questions were answered.  

## 2015-12-26 NOTE — Discharge Summary (Signed)
Physician Discharge Summary  Renee [name] [id number] DOB: 1986/03/01 DOA: 12/23/2015  PCP: Purvis Kilts, MD  Admit date: 12/23/2015 Discharge date: 12/26/2015  Time spent: 45 minutes  Recommendations for Outpatient Follow-up:  Patient will be discharged to home.  Patient will need to follow up with primary care provider within one week of discharge.  Follow up with Dr. Sharol Given in 2 weeks.  Patient should continue medications as prescribed.  Patient should continue Jevity tube feeds.   Discharge Diagnoses:  Closed right hip fracture Asthma History of cerebral palsy/intellectual disabilities Leukocytosis Nutrition/Underweight  Discharge Condition: Stable  Diet recommendation: Continue tube feeds with Jevity  Filed Weights   12/24/15 1400 12/25/15 0500 12/26/15 0624  Weight: 27.216 kg (60 lb) 29.03 kg (64 lb) 30.028 kg (66 lb 3.2 oz)    History of present illness:  on 12/23/2015 by Dr. Gerri Lins Renee Richarson is a 30 y.o. female with medical history significant of cerebral palsy, mental retardation, hydrocephalus, seizure disorder, osteopenia, bilateral hip dysplasia, scoliosis, asthma who was brought to the emergency department by her mother after she noticed that the patient's right hip was hyperextended and not feeling normal.  Per patient's mother, she works during the daytime and the patient is taking care by home health nurses. She states that she did not get any incident report from the home health nursing staff. Her mother went to change the patient's diaper and noticed that her right hip was not feeling normal. The patient also seemed to be in a lot of discomfort. She had surgery on her right hip about 15 years ago.  Hospital Course:  Closed right hip fracture -Right Hip fracture: displaced fracture extends through the right proximal femoral diaphysis  -Orthopedic surgery consulted and appreciated, Dr. Sharol Given- conservative management with bracing -Per mother, does not  want patient to have surgery. -Continue pain control -Pending brace- per ortho, brace is being fabricated by [employer], she can be discharged home and [employer] can apply the brace at home. Follow up with Dr. Sharol Given in 2 weeks. -CM consulted as mother would like patient to return home. She has caregivers.  Asthma -Currently stable, continue home medications  History of cerebral palsy/intellectual disabilities -Stable  Leukocytosis -resolved -Likely reactive to the above  Nutrition/Underweight -nutrition consulted and continue Jevity  Code status: DNR  Consultants Orthopedic surgery  Procedures  None  Discharge Exam: Filed Vitals:   12/25/15 2013 12/26/15 0624  BP: 119/73 114/78  Pulse: 92 99  Temp: 97.7 F (36.5 C) 97.5 F (36.4 C)  Resp: 16 16    Exam  General: Cerebral palsy, MR  HEENT: NCAT, mucous membranes moist  Cardiovascular: S1 S2 auscultated, no murmurs, RRR  Respiratory: Clear to auscultation bilaterally, upper airway congestion  Abdomen: Soft, nontender, nondistended, + bowel sounds. +Peg  Extremities: warm dry without cyanosis clubbing or edema- flexed hips, contracted  Neuro: unable to assess due to MR and cerebral palsy  Discharge Instructions      Discharge Instructions    Discharge instructions    Complete by:  As directed   Patient will be discharged to home.  Patient will need to follow up with primary care provider within one week of discharge.  Follow up with Dr. Sharol Given in 2 weeks.  Patient should continue medications as prescribed.  Patient should continue Jevity tube feeds.            Medication List    TAKE these medications        albuterol (2.5  MG/3ML) 0.083% nebulizer solution  Commonly known as:  PROVENTIL  Take 3 mLs (2.5 mg total) by nebulization every 6 (six) hours as needed for wheezing or shortness of breath.     ALPRAZolam 0.25 MG tablet  Commonly known as:  XANAX  Give 1/4 to 1/2 tablet up to twice per day as  needed for severe irritability     arformoterol 15 MCG/2ML Nebu  Commonly known as:  BROVANA  Take 2 mLs (15 mcg total) by nebulization 2 (two) times daily.     baclofen 10 MG tablet  Commonly known as:  LIORESAL  TAKE 2 TABLETS AT 7AM, 1 AT 11AM, 2 AT 3PM, AND 2 AT 8PM.     budesonide 0.25 MG/2ML nebulizer solution  Commonly known as:  PULMICORT  Take 2 mLs (0.25 mg total) by nebulization 2 (two) times daily.     diazepam 1 MG/ML solution  Commonly known as:  VALIUM  Give 50ml every 8 hours as needed for agitation     feeding supplement (JEVITY 1.5 CAL/FIBER) Liqd  Place 237 mLs into feeding tube 4 (four) times daily. 1 can at 0700, 1100, 1500, and 2000.     HYDROcodone-acetaminophen 5-325 MG tablet  Commonly known as:  NORCO/VICODIN  Take 1 tablet by mouth every 6 (six) hours as needed for moderate pain.     medroxyPROGESTERone 150 MG/ML injection  Commonly known as:  DEPO-PROVERA  INJECT 1ML INTO THE MUSCLE EVERY 3 MONTHS.     polyethylene glycol powder powder  Commonly known as:  MIRALAX  Take 1/2 capful every day or every other day - via peg tube -  as needed for constipation     primidone 50 MG tablet  Commonly known as:  MYSOLINE  TAKE 4 TABLETS EVERY MORNING,4 TABLETS IN THE AFTERNOON, AND 4 TABLETS IN THE EVENING.     ranitidine 150 MG/10ML syrup  Commonly known as:  ZANTAC  Place 20 mLs (300 mg total) into feeding tube 2 (two) times daily.     [name] 0.5 MG tablet  Commonly known as:  RISPERDAL  Give 3 tablets via G-tube at 7AM, 3 tablets at 11AM, 3 tablets at 3PM and 1 tablet via G-tube at bedtime     tiZANidine 2 MG tablet  Commonly known as:  ZANAFLEX  Crush 2 tablets and give in G-tube at bedtime for spasms. May repeat 1/2 tablet during the night if needed for spasms       Allergies  Allergen Reactions  . Other     Paper and adhesive tape - blisters  . Penicillins Rash    Has patient had a PCN reaction causing immediate rash,  facial/tongue/throat swelling, SOB or lightheadedness with hypotension: NO Has patient had a PCN reaction causing severe rash involving mucus membranes or skin necrosis: NO Has patient had a PCN reaction that required hospitalization: NO Has patient had a PCN reaction occurring within the last 10 years: NO If all of the above answers are "NO", then may proceed with Cephalosporin use.   . Latex Rash and Other (See Comments)    blisters   Follow-up Information    Follow up with DUDA,MARCUS V, MD In 2 weeks.   Specialty:  Orthopedic Surgery   Contact information:   [place] [address] [phone]       Follow up with Purvis Kilts, MD. Schedule an appointment as soon as possible for a visit in 1 week.   Specialty:  Family Medicine  Why:  Hospital follow up   Contact information:   [address] [place] [phone] [phone]        The results of significant diagnostics from this hospitalization (including imaging, microbiology, ancillary and laboratory) are listed below for reference.    Significant Diagnostic Studies: Dg Hip Unilat  With Pelvis 2-3 Views Right  12/23/2015  CLINICAL DATA:  Abnormal displacement of the right leg. Initial encounter. EXAM: DG HIP (WITH OR WITHOUT PELVIS) 2-3V RIGHT COMPARISON:  None. FINDINGS: There is a displaced fracture extending through the right proximal femoral diaphysis, with more than 1 shaft width displacement. Displacement, shortening and angulation vary depending on positioning. Significant surrounding soft tissue swelling is noted. The right femoral head remains seated at the acetabulum. Fusion hardware is noted along the thoracolumbar spine and sacroiliac joints. IMPRESSION: Displaced fracture extends through the right proximal femoral diaphysis, with more than 1 shaft width displacement. Displacement, shortening and angulation vary depending on positioning. Electronically Signed   By: Garald Balding  M.D.   On: 12/23/2015 21:19    Microbiology: No results found for this or any previous visit (from the past 240 hour(s)).   Labs: Basic Metabolic Panel:  Recent Labs Lab 12/23/15 2229 12/24/15 0643 12/25/15 0443 12/26/15 0330  NA 136 137 139 138  K 4.1 4.0 4.2 3.5  CL 100* 100* 107 104  CO2 28 25 20* 28  GLUCOSE 138* 93 69 109*  BUN 6 <5* 6 <5*  CREATININE <0.30* <0.30* <0.30* <0.30*  CALCIUM 9.4 9.3 8.8* 8.6*  MG 2.2  --   --   --   PHOS 3.4  --   --   --    Liver Function Tests:  Recent Labs Lab 12/24/15 0643 12/25/15 0443 12/26/15 0330  AST 25 26 18   ALT 27 16 19   ALKPHOS 174* 155* 141*  BILITOT 0.5 <0.1* 0.5  PROT 7.1 6.1* 6.1*  ALBUMIN 3.6 2.8* 3.1*   No results for input(s): LIPASE, AMYLASE in the last 168 hours. No results for input(s): AMMONIA in the last 168 hours. CBC:  Recent Labs Lab 12/23/15 2229 12/24/15 0643 12/25/15 0443 12/26/15 0330  WBC 13.3* 7.8 8.7 9.4  NEUTROABS 11.9* 5.1 6.1 6.5  HGB 15.6* 15.3* 12.9 13.4  HCT 48.0* 47.0* 40.4 41.4  MCV 101.5* 100.4* 100.7* 100.0  PLT 202 198 171 193   Cardiac Enzymes: No results for input(s): CKTOTAL, CKMB, CKMBINDEX, TROPONINI in the last 168 hours. BNP: BNP (last 3 results) No results for input(s): BNP in the last 8760 hours.  ProBNP (last 3 results) No results for input(s): PROBNP in the last 8760 hours.  CBG:  Recent Labs Lab 12/25/15 0806 12/25/15 1200 12/25/15 1615 12/25/15 2018 12/26/15 0623  GLUCAP 140* 95 117* 148* 97       Signed:  Ghassan Coggeshall  [facility] 12/26/2015, 9:55 AM

## 2016-01-25 ENCOUNTER — Ambulatory Visit (HOSPITAL_COMMUNITY): Payer: 59 | Attending: Family

## 2016-01-25 ENCOUNTER — Encounter (HOSPITAL_COMMUNITY): Payer: Self-pay

## 2016-01-25 DIAGNOSIS — M6281 Muscle weakness (generalized): Secondary | ICD-10-CM | POA: Insufficient documentation

## 2016-01-25 DIAGNOSIS — G809 Cerebral palsy, unspecified: Secondary | ICD-10-CM | POA: Diagnosis present

## 2016-01-25 NOTE — Therapy (Signed)
[facility] [address] [place], [place], [address] Phone: [phone]   Fax:  [phone]  Occupational Therapy Wheelchair Evaluation  Patient Details  Name: Renee [name] MRN: [id number] Date of Birth: 03/29/1986 Referring Provider: Rockwell Germany, NP  Encounter Date: 01/25/2016      OT End of Session - 01/25/16 1751    Visit Number 1   Number of Visits 1   Authorization Type 1) UHC Choice Plus 2) Medicaid   OT Start Time [id number]   OT Stop Time 1730   OT Time Calculation (min) 45 min   Activity Tolerance Patient tolerated treatment well   Behavior During Therapy [facility] for tasks assessed/performed      Past Medical History  Diagnosis Date  . Cerebral palsy ([place])   . Mental retardation   . Hydrocephalus   . Seizure disorder ([place])   . Osteopenia   . Hip dysplasia     bilateral  . DNR (do not resuscitate)   . DNI (do not intubate)   . Scoliosis   . Asthma     breathing treatment  . DNR (do not resuscitate) 04/28/2014  . UTI (urinary tract infection)     Past Surgical History  Procedure Laterality Date  . Spinal fusion  2005  . 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 Laparoscopic nissen fundoplication    . Gastrostomy tube placement      at same time as nissen  . Tympanostomy tube placement    . Ileopsoas release Bilateral     Dr Susy Frizzle  . Tenotomy adductor / hamstring closed Bilateral 04/25/00    There were no vitals filed for this visit.      Subjective Assessment - 01/25/16 1750    Currently in Pain? Other (Comment)  Pt is nonverbal. Pt with periods of crying during evaluation.           [facility] OT Assessment - 01/25/16 1750    Assessment   Diagnosis Wheelchair evaluation   Referring Provider Rockwell Germany, NP                                     Plan - 01/25/16 1752    OT Frequency One time visit   OT Treatment/Interventions Patient/family education   Consulted and Agree  with Plan of Care Family member/caregiver   Family Member Consulted Mom: Stanton Kidney      Patient will benefit from skilled therapeutic intervention in order to improve the following deficits and impairments:  Decreased mobility, Impaired tone  Visit Diagnosis: Muscle weakness (generalized)  Cerebral palsy, unspecified ([place])    Problem List Patient Active Problem List   Diagnosis Date Noted  . Closed fracture of right femur ([place])   . Asthma 12/24/2015  . Closed right hip fracture ([place]) 12/23/2015  . 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 Acute bronchitis 04/26/2015  . DNR (do not resuscitate) 04/28/2014  . Sepsis ([place]) 04/26/2014  . UTI (lower urinary tract infection) 04/26/2014  . Cellulitis of hip 04/26/2014  . LPRD (laryngopharyngeal reflux disease) 03/09/2014  . Protein-calorie malnutrition, severe ([place]) 12/25/2013  . Malnourished ([place]) 12/25/2013  . Aspiration pneumonia ([place]) 12/24/2013  . Acute and chronic respiratory failure 12/24/2013  . Broken or cracked tooth, nontraumatic 06/03/2013  . 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  Severe intellectual disabilities 01/02/2013  . 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 Irritability 01/02/2013  . Other and unspecified special symptom or syndrome, not elsewhere classified 01/02/2013  . 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 Presence of cerebrospinal fluid drainage device 01/02/2013  . Unspecified constipation 01/02/2013  . Myoclonus 01/02/2013  . Infantile cerebral palsy ([place]) 10/14/2009  . Mild persistent asthma 10/14/2009    Ailene Ravel, OTR/L,CBIS  [phone]  01/25/2016, 5:54 PM  [facility] [address] [place], [place], [address] Phone: [phone]   Fax:  [phone]  Name: Renee [name] MRN: [id number] Date of Birth: [birth year]

## 2016-01-30 ENCOUNTER — Other Ambulatory Visit: Payer: Self-pay | Admitting: Family

## 2016-02-02 ENCOUNTER — Ambulatory Visit (INDEPENDENT_AMBULATORY_CARE_PROVIDER_SITE_OTHER): Payer: 59 | Admitting: *Deleted

## 2016-02-02 ENCOUNTER — Encounter: Payer: Self-pay | Admitting: *Deleted

## 2016-02-02 DIAGNOSIS — Z3042 Encounter for surveillance of injectable contraceptive: Secondary | ICD-10-CM

## 2016-02-02 MED ORDER — MEDROXYPROGESTERONE ACETATE 150 MG/ML IM SUSP
150.0000 mg | Freq: Once | INTRAMUSCULAR | Status: AC
  Administered 2016-02-02: 150 mg via INTRAMUSCULAR

## 2016-02-02 NOTE — Progress Notes (Signed)
Pt here for Depo. I always use a 25 gauge needle but med would not go through this time. I used a 22 gauge and got med in. Pt was stuck 3 times before med went in. Pt tolerated shot well. Pt is not sexually active so pregnancy test was not done. Pt is on shot for period management. Return in 12 weeks for next shot. [place]

## 2016-02-21 ENCOUNTER — Telehealth: Payer: Self-pay
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
## 2016-02-21 NOTE — Telephone Encounter (Signed)
Renee [name], mom, lvm stating that [place] had a sz yesterday. CB# [phone]  I called mom and she said that yesterday upon waking up in the morning Renee [name] had jerking and stiffening each episode lasted a few seconds and it went on for 45 mins. Mom said that she gave her two extra Primidone. For the past two weeks Renee [name] has been wining and crying as if she is in pain. The orthopaedic said that the broken leg is healing and he did not think the pain was coming from that. Renee [name] had a few jerking episodes today lasting just a few seconds. Please call Renee [name] at: [phone].

## 2016-02-22 NOTE — Telephone Encounter (Signed)
I reviewed your note and agree with the plan.  We may need to consider an EEG.

## 2016-03-06 ENCOUNTER — Telehealth: Payer: Self-pay

## 2016-03-06 NOTE — Telephone Encounter (Signed)
Renee [name] would you please call Ms. Walker tomorrow?

## 2016-03-06 NOTE — Telephone Encounter (Signed)
Renee [name] called stating that she needs a Verbal approval to continue her services for the next 60 days. She is requesting a call back.   [phone]

## 2016-03-07 NOTE — Telephone Encounter (Signed)
Izora Gala called back and I gave her approval to continue Renee [name]'s services. TG

## 2016-03-07 NOTE — Telephone Encounter (Signed)
I called and left a message asking Renee [name] to return my call. TG

## 2016-03-12 ENCOUNTER — Telehealth: Payer: Self-pay

## 2016-03-12 DIAGNOSIS — G808 Other cerebral palsy: Secondary | ICD-10-CM

## 2016-03-12 MED ORDER — TIZANIDINE HCL 2 MG PO TABS: ORAL_TABLET | ORAL | 5 refills | Status: DC

## 2016-03-12 NOTE — Telephone Encounter (Signed)
Renee [name], mom, lvm stating that pt needs a refill with new directions for Tizanidine sent to [facility]. She said that due to the spasms there was an increase in the dosage. She is giving 1.5 tabs @ 7 am, 1 tab @ noon, 2 tabs @ 6 pm, and .5 tab in the middle of the night as needed. Renee [name]'s CB# [phone].

## 2016-03-12 NOTE — Telephone Encounter (Signed)
Updated Rx sent to pharmacy. TG

## 2016-03-21 IMAGING — CR DG CHEST 1V PORT
1 series · 1 of 1 positions shown · non-contrast
Comparison: 12/26/2013

CLINICAL DATA: Respiratory failure.

EXAM:
PORTABLE CHEST - 1 VIEW

[AP]
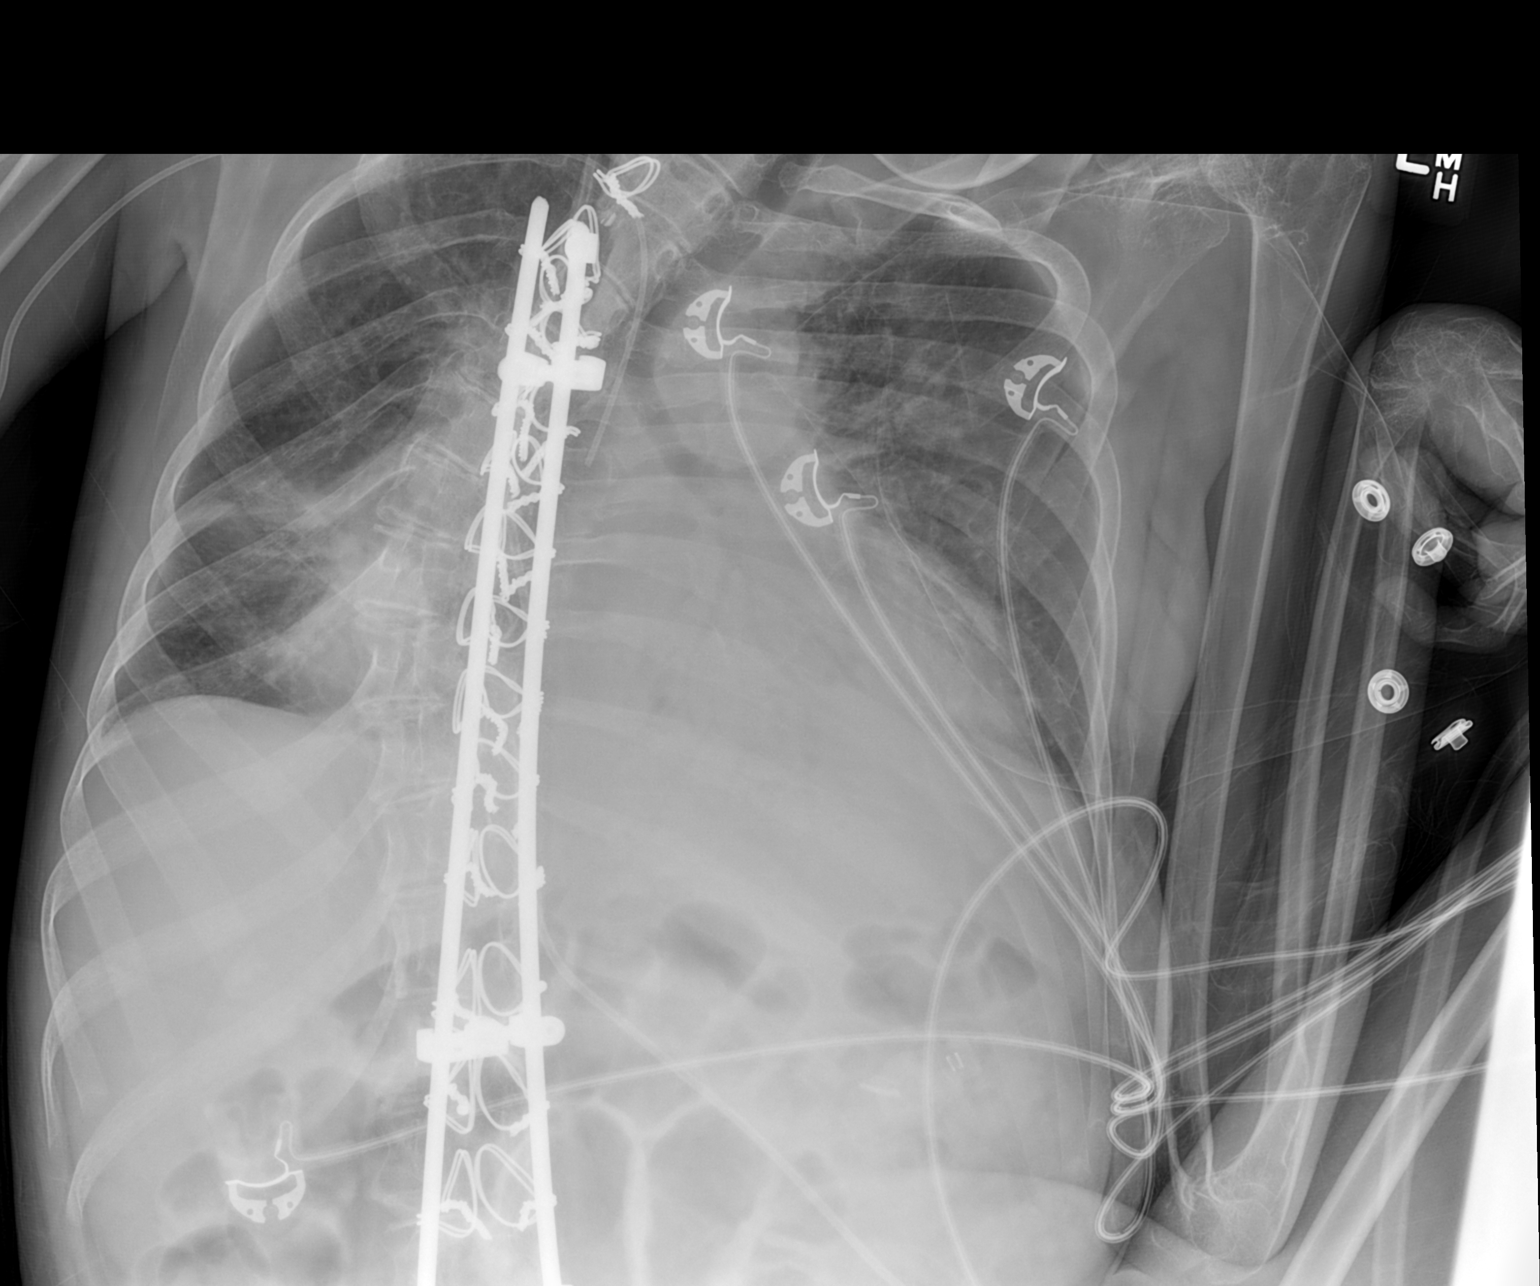

[1 of 1 positions shown; findings below may reference images not displayed]

FINDINGS: The heart is enlarged. The PICC line is stable. There are bilateral
lower lobe infiltrates. No effusions.
IMPRESSION: Bibasilar infiltrates.

## 2016-04-05 ENCOUNTER — Ambulatory Visit (INDEPENDENT_AMBULATORY_CARE_PROVIDER_SITE_OTHER): Payer: 59 | Admitting: Pulmonary Disease

## 2016-04-05 ENCOUNTER — Encounter: Payer: Self-pay | Admitting: Pulmonary Disease

## 2016-04-05 VITALS — BP 118/72 | HR 120 | Wt <= 1120 oz

## 2016-04-05 DIAGNOSIS — J4530 Mild persistent asthma, uncomplicated: Secondary | ICD-10-CM
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
## 2016-04-05 NOTE — Patient Instructions (Signed)
Check with your primary care provider about getting influenza vaccine and prevnar vaccine  Follow up in 6 months

## 2016-04-26 ENCOUNTER — Ambulatory Visit (INDEPENDENT_AMBULATORY_CARE_PROVIDER_SITE_OTHER): Payer: 59 | Admitting: Gastroenterology

## 2016-04-26 ENCOUNTER — Ambulatory Visit: Payer: 59

## 2016-04-26 ENCOUNTER — Encounter: Payer: Self-pay | Admitting: Gastroenterology

## 2016-04-26 VITALS — BP 102/70 | HR 100

## 2016-04-26 DIAGNOSIS — K3184 Gastroparesis: Secondary | ICD-10-CM

## 2016-04-26 DIAGNOSIS — K5900 Constipation, unspecified: Secondary | ICD-10-CM | POA: Diagnosis not present

## 2016-04-26 DIAGNOSIS — K219 Gastro-esophageal reflux disease without esophagitis: Secondary | ICD-10-CM | POA: Diagnosis not present

## 2016-04-26 DIAGNOSIS — Z931 Gastrostomy status: Secondary | ICD-10-CM

## 2016-04-26 MED ORDER — POLYETHYLENE GLYCOL 3350 17 GM/SCOOP PO POWD: ORAL | 10 refills | Status: DC

## 2016-04-26 MED ORDER — RANITIDINE HCL 150 MG/10ML PO SYRP: 150.0000 mg | ORAL_SOLUTION | Freq: Two times a day (BID) | ORAL | 10 refills | Status: DC

## 2016-04-26 NOTE — Patient Instructions (Signed)
We have sent the following medications to your pharmacy for you to pick up at your convenience: Miralax and Zantac. Use as directed.

## 2016-04-26 NOTE — Progress Notes (Signed)
HPI :  30 y/o female with history of severe cerebral palsy and scoliosis, permanent feeding tube in place. She has a history of aspiration pneumonias in the past, last in October 2016. She has had a prior gastric emptying study showing gastroparesis in 2015. She had a PEG fall out in November and it was replaced by IR. She has also had chronic constipation taking miralax.   Patient and family here for followup, previously seen by Dr. Olevia Perches. She has been on zantac twice daily which family thinks has helped with her reflux / regurgitation. Family would report some regurgitation prior to starting zantac, and this has significantly helped. Family adjusts tube feeds based on residuals. The PEG is functioning okay. Mother changes them out and changed it about a month ago and patient tolerated it okay.   She has had worsening problems with constipation most recently. She is having a bowel movement once per week. She is on miralax 2 capfulls per week. She has been using enemas once per week if needed. Last BM on Tuesday. She has not been on a higher dose in the past. She can get abdominal distension at times and it relieved with a bowel movement. She has had fecal impaction in the past, last a few years ago. No blood in the stools. Family is not aware of other regimens used in the past for constipation. Mother reports last BM 2 days ago and does not think she is impacted.   Past Medical History:  Diagnosis Date  . Asthma    breathing treatment  . Broken femur ([place]) 12/23/15  . Cerebral palsy ([place])   . DNI (do not intubate)   . DNR (do not resuscitate)   . DNR (do not resuscitate) 04/28/2014  . Hip dysplasia    bilateral  . Hydrocephalus   . Mental retardation   . Osteopenia   . Scoliosis   . Seizure disorder ([place])   . UTI (urinary tract infection)      Past Surgical History:  Procedure Laterality Date  . GASTROSTOMY TUBE PLACEMENT     at same time as nissen  . ILEOPSOAS RELEASE Bilateral      Dr Susy Frizzle  . LAPAROSCOPIC NISSEN FUNDOPLICATION    . SPINAL FUSION  2005  . TENOTOMY ADDUCTOR / HAMSTRING CLOSED Bilateral 04/25/00  . TYMPANOSTOMY TUBE PLACEMENT    . vt shunt placement  1989   At birth   Family History  Problem Relation Age of Onset  . Breast cancer Maternal Grandmother   . Heart disease Maternal Grandfather   . Thyroid disease Maternal Grandfather   . Thyroid disease Maternal Aunt    Social History  Substance Use Topics  . Smoking status: Never Smoker  . Smokeless tobacco: Never Used  . Alcohol use No   Current Outpatient Prescriptions  Medication Sig Dispense Refill  . albuterol (PROVENTIL) (2.5 MG/3ML) 0.083% nebulizer solution Take 3 mLs (2.5 mg total) by nebulization every 6 (six) hours as needed for wheezing or shortness of breath. 360 mL 5  . ALPRAZolam (XANAX) 0.25 MG tablet Give 1/4 to 1/2 tablet up to twice per day as needed for severe irritability 30 tablet 1  . arformoterol (BROVANA) 15 MCG/2ML NEBU Take 2 mLs (15 mcg total) by nebulization 2 (two) times daily. (Patient taking differently: Take 15 mcg by nebulization as needed. ) 120 mL 5  . baclofen (LIORESAL) 10 MG tablet TAKE 2 TABLETS AT 7AM, 1 AT 11AM, 2 AT 3PM, AND 2 AT  8PM. 210 tablet 5  . budesonide (PULMICORT) 0.25 MG/2ML nebulizer solution Take 2 mLs (0.25 mg total) by nebulization 2 (two) times daily. 120 mL 5  . diazepam (VALIUM) 1 MG/ML solution Give 60ml every 8 hours as needed for agitation 90 mL 1  . HYDROcodone-acetaminophen (NORCO/VICODIN) 5-325 MG tablet Take 1 tablet by mouth every 6 (six) hours as needed for moderate pain. 30 tablet 0  . medroxyPROGESTERone (DEPO-PROVERA) 150 MG/ML injection INJECT 1ML INTO THE MUSCLE EVERY 3 MONTHS. 1 mL 3  . Nutritional Supplements (FEEDING SUPPLEMENT, JEVITY 1.5 CAL/FIBER,) LIQD Place 237 mLs into feeding tube 4 (four) times daily. 1 can at 0700, 1100, 1500, and 2000.    Marland Kitchen polyethylene glycol powder (MIRALAX) powder Take 1/2 capful every day or  every other day - via peg tube -  as needed for constipation 255 g 6  . primidone (MYSOLINE) 50 MG tablet TAKE 4 TABLETS EVERY MORNING,4 TABLETS IN THE AFTERNOON, AND 4 TABLETS IN THE EVENING. (Patient taking differently: three times daily (7am, 3pm and 10pm)) 360 tablet 5  . ranitidine (ZANTAC) 150 MG/10ML syrup Place 20 mLs (300 mg total) into feeding tube 2 (two) times daily. 1200 mL 10  . [name] (RISPERDAL) 0.5 MG tablet Give 3 tablets via G-tube at 7AM, 3 tablets at 11AM, 3 tablets at 3PM and 1 tablet via G-tube at bedtime 340 tablet 5  . tiZANidine (ZANAFLEX) 2 MG tablet Crush and give via G-tube - 1+1/2 tablets at 7AM, 1 tablet at noon, 2 tablets at 6pm. May give 1/2 tablet during the night if needed for spasms 155 tablet 5   No current facility-administered medications for this visit.    Allergies  Allergen Reactions  . Other     Paper and adhesive tape - blisters  . Penicillins Rash    Has patient had a PCN reaction causing immediate rash, facial/tongue/throat swelling, SOB or lightheadedness with hypotension: NO Has patient had a PCN reaction causing severe rash involving mucus membranes or skin necrosis: NO Has patient had a PCN reaction that required hospitalization: NO Has patient had a PCN reaction occurring within the last 10 years: NO If all of the above answers are "NO", then may proceed with Cephalosporin use.   . Latex Rash and Other (See Comments)    blisters     Review of Systems: All systems reviewed and negative except where noted in HPI.    Lab Results  Component Value Date   WBC 9.4 12/26/2015   HGB 13.4 12/26/2015   HCT 41.4 12/26/2015   MCV 100.0 12/26/2015   PLT 193 12/26/2015      Physical Exam: BP 102/70 (BP Location: Right Arm, Patient Position: Sitting, Cuff Size: Normal)   Pulse 100  Constitutional: female in no acute distress, strapped in wheelchair, unable to respond to questioning HEENT: Normocephalic and atraumatic. Conjunctivae are  normal. No scleral icterus. Neck supple.  Cardiovascular: Normal rate, regular rhythm.  Pulmonary/chest: Effort normal and breath sounds normal anteriorly Abdominal: Soft, nondistended, nontender. PEG in place, site looks good without erythema,  There are no masses palpable. Extremities: no edema Lymphadenopathy: No cervical adenopathy noted. Skin: Skin is warm and dry. No rashes noted.  ASSESSMENT AND PLAN: 30 year old female with history of cerebral palsy, with history of gastroparesis/reflux, dependent on tube feedings, here for follow-up visit.  Main concern from the family today is patient's worsening constipation. She had a leg fracture month ago requiring narcotics for pain control, which could have precipitated her constipation.  She is off of narcotics now and continues to pass stool, does not seem impacted currently. Discussed options with the family, recommended we titrate up MiraLAX to daily use (currently using twice per week), and continue to use enemas as needed. If despite doing this, there is no improvement in her constipation, then they will contact me for reassessment. I explained to them how to titrate MiraLAX if this dosing is too strong for the patient.  Otherwise we discussed gastroparesis and reflux, and her history of aspiration pneumonias. The family is cautious with tube feedings and ensuring to hold feedings with high residuals. Her dose of Zantac is too high, and will change dosing to 150 mg twice a day. If this does not continue to control her symptoms, we will consider switching her to a PPI. We also discussed potentially using Reglan in light of her gastroparesis. We discussed the risks of Reglan use, the patient's mother wishes to avoid this if possible present time, but will think about it and let me know if they change their mind. They can otherwise f/u PRN.    [place], MD [facility] Pager [phone]

## 2016-04-27 ENCOUNTER — Encounter: Payer: Self-pay | Admitting: *Deleted

## 2016-04-27 ENCOUNTER — Ambulatory Visit (INDEPENDENT_AMBULATORY_CARE_PROVIDER_SITE_OTHER): Payer: 59 | Admitting: *Deleted

## 2016-04-27 DIAGNOSIS — Z3042 Encounter for surveillance of injectable contraceptive: Secondary | ICD-10-CM

## 2016-04-27 MED ORDER — MEDROXYPROGESTERONE ACETATE 150 MG/ML IM SUSP
150.0000 mg | Freq: Once | INTRAMUSCULAR | Status: AC
  Administered 2016-04-27: 150 mg via INTRAMUSCULAR

## 2016-04-27 NOTE — Progress Notes (Signed)
Pt here for Depo. Pt not sexually active so no pregnancy test done. Use 22G needle. Pt tolerated shot well. Return in 12 weeks for next shot. [place]

## 2016-05-01 ENCOUNTER — Ambulatory Visit (INDEPENDENT_AMBULATORY_CARE_PROVIDER_SITE_OTHER): Payer: 59 | Admitting: Orthopedic Surgery

## 2016-05-01 DIAGNOSIS — S7221XD Displaced subtrochanteric fracture of right femur, subsequent encounter for closed fracture with routine healing: Secondary | ICD-10-CM

## 2016-05-04 ENCOUNTER — Telehealth (INDEPENDENT_AMBULATORY_CARE_PROVIDER_SITE_OTHER): Payer: Self-pay

## 2016-05-04 NOTE — Telephone Encounter (Signed)
I left a message and asked Beth to call me back. TG

## 2016-05-04 NOTE — Telephone Encounter (Signed)
Renee [name], [facility], lvm stating that she needs a verbal order to continue services. She said that she visited the home this morning. Renee [name] was doing a lot of crying and moaning and they are unsure of cause. Staff said that ever since [name] fractured her leg tends to do a lot of the crying/moaning.

## 2016-05-07 NOTE — Telephone Encounter (Signed)
I called and verified that Renee [name] had received the verbal order to continue services. TG

## 2016-05-29 ENCOUNTER — Other Ambulatory Visit: Payer: Self-pay | Admitting: Family

## 2016-05-29 DIAGNOSIS — R454 Irritability and anger: Secondary | ICD-10-CM

## 2016-05-29 DIAGNOSIS — F72 Severe intellectual disabilities: Secondary | ICD-10-CM

## 2016-06-23 ENCOUNTER — Other Ambulatory Visit: Payer: Self-pay | Admitting: Family

## 2016-06-28 ENCOUNTER — Other Ambulatory Visit: Payer: Self-pay

## 2016-06-28 ENCOUNTER — Telehealth: Payer: Self-pay | Admitting: Pulmonary Disease

## 2016-06-28 MED ORDER — LEVOFLOXACIN 25 MG/ML PO SOLN: 50.0000 mg | Freq: Two times a day (BID) | ORAL | 0 refills | Status: DC

## 2016-06-28 NOTE — Telephone Encounter (Signed)
VS  Please Advise-Sick message  Pt. Mother called concerned because daughter has had increase sob, they have been doing breathing treatments q4h, fever is 100, coughing with greenish mucus, wanting to know if Levaquin could be called.

## 2016-06-28 NOTE — Telephone Encounter (Signed)
LMOMTCB x 1 

## 2016-06-28 NOTE — Telephone Encounter (Signed)
Please inform pt's mother that script for levaquin has been sent.

## 2016-06-29 ENCOUNTER — Other Ambulatory Visit: Payer: Self-pay

## 2016-06-29 NOTE — Telephone Encounter (Signed)
Okay, then please change order per pharmacy instructions.

## 2016-06-29 NOTE — Telephone Encounter (Signed)
Pt mother calling w/ question ab med directions [phone].Hillery Hunter

## 2016-06-29 NOTE — Telephone Encounter (Signed)
Spoke with Monica Martinez with [facility], who states the recommended dose for pt wight is 208mg  for Levaquin.  VS please advise. Thanks.

## 2016-06-29 NOTE — Telephone Encounter (Signed)
Please make sure the pharmacy is aware that Renee [name] only weighs 58 pounds.  Please check with pharmacy what the recommended dose should be with her weight.

## 2016-06-29 NOTE — Telephone Encounter (Signed)
Noted  

## 2016-06-29 NOTE — Telephone Encounter (Signed)
VS  Please Advise  Mother spoke with pharmacy and stated 100mg  a day is a low dose for treating pneumonia. This dose usually treats feeding tube infections. Mother would like to make sure your ok with that dose.

## 2016-06-29 NOTE — Telephone Encounter (Signed)
Spoke with [place] and made him aware of below message. Rx has been corrected to 100mg  bid in feeding tube. (verbal given to glen). I have made pt mother aware of this. Nothing further needed.  Will route to VS for a FYI.

## 2016-07-04 ENCOUNTER — Telehealth (INDEPENDENT_AMBULATORY_CARE_PROVIDER_SITE_OTHER): Payer: Self-pay

## 2016-07-04 NOTE — Telephone Encounter (Signed)
Beth from [facility], lvm stating that she needs a verbal order to continue CNA services. CB# [phone]

## 2016-07-04 NOTE — Telephone Encounter (Signed)
I left a message with approval to continue client services. TG

## 2016-07-09 ENCOUNTER — Telehealth (INDEPENDENT_AMBULATORY_CARE_PROVIDER_SITE_OTHER): Payer: Self-pay

## 2016-07-09 DIAGNOSIS — R454 Irritability and anger: Secondary | ICD-10-CM

## 2016-07-09 MED ORDER — DIAZEPAM 1 MG/ML PO SOLN: ORAL | 1 refills | Status: DC

## 2016-07-09 NOTE — Telephone Encounter (Signed)
Renee [name], mom, lvm stating that Renee [name] has been irritable for the past month. She said that she has checked Renee [name] from top to bottom and cannot find a reason for the irritability. She has been giving her diazepam 1 mg/mL; 3 mL q 8 prn. She said that last night was the first time Renee [name] has slept through the night in a month. Renee [name] is requesting a refill on the diazepam.

## 2016-07-09 NOTE — Telephone Encounter (Signed)
Noted, I reviewed your note and believe that this is a very difficult situation and that this is a reasonable plan.  We will talk more on Friday when you see me Renee [name].

## 2016-07-09 NOTE — Telephone Encounter (Signed)
I called and talked to Mom. She said that Renee [name] doesn't seem to have any increase in spasticity, any infection or any obvious pain that she can find. She has been screaming and crying day and night. Mom gives her Diazepam when comfort measures fail to bring about any lessening in the screaming, and it helps to reduce the screaming to a more tolerable level. She usually gives it about once per day. Last night Renee [name] (and Mom) were able to sleep for several consecutive hours. Mom asked if there was anything else to give Renee [name] to lessen her irritability. I told Mom that our options were limited but that I would research other medications to try. Mom is physically and emotionally exhausted from Renee [name]'s agitation. I asked Mom to bring Renee [name] in for evaluation and scheduled her for a visit with me on Fri 07/13/16. I told Mom that I would refill the Diazepam for her to have until then and told her that it was ok to give BID to try to help Renee [name] be calmer. I will consult with Dr. Gaynell Face about Renee [name]'s irritability prior to or at the time of her appointment.  Mom agreed with these plans. Renee [name]

## 2016-07-13 ENCOUNTER — Encounter (INDEPENDENT_AMBULATORY_CARE_PROVIDER_SITE_OTHER): Payer: Self-pay | Admitting: Family

## 2016-07-13 ENCOUNTER — Ambulatory Visit (INDEPENDENT_AMBULATORY_CARE_PROVIDER_SITE_OTHER): Payer: 59 | Admitting: Family

## 2016-07-13 VITALS — HR 96 | Wt <= 1120 oz

## 2016-07-13 DIAGNOSIS — R454 Irritability and anger: Secondary | ICD-10-CM | POA: Diagnosis not present

## 2016-07-13 DIAGNOSIS — G808 Other cerebral palsy: Secondary | ICD-10-CM

## 2016-07-13 DIAGNOSIS — F72 Severe intellectual disabilities: Secondary | ICD-10-CM | POA: Diagnosis not present

## 2016-07-13 DIAGNOSIS — G40319 Generalized idiopathic epilepsy and epileptic syndromes, intractable, without status epilepticus: Secondary | ICD-10-CM | POA: Diagnosis not present

## 2016-07-13 MED ORDER — TIZANIDINE HCL 2 MG PO TABS: ORAL_TABLET | ORAL | 5 refills | Status: DC

## 2016-07-13 NOTE — Progress Notes (Signed)
Patient: Renee [name] MRN: [id number] Sex: female DOB: January 01, 1986  Provider: Rockwell Germany, NP Location of Care: [facility] Child [facility]rology  Note type: Routine return visit  History of Present Illness: Referral Source: Renee Sites, MD History from: [facility] chart and mother Chief Complaint: Epilepsy  Renee [name] is a 30 y.o. 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 Renee [name] has minor motor seizures that typically occur a few times per week. 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 The patient is medically fragile, wheelchair bound and requires total care. She requires nebulizer treatments and intermittent oxygen at home. She has difficulties managing respiratory secretions and sees a pulmonologist regularly. Renee [name] was last seen Dec 20, 2015. She is seen today because Mom called me on December 18th to report an increase in irritability and crying.   Renee [name] sometimes has increase in myoclonus, and then an increase in seizures. Mom gives her an occasional extra Mysoline and the behaviors improve. Renee [name] has frequent episodes of agitation, irritability and crying. Mom reports today that Renee [name] has been crying inconsolably for about 6 weeks. She has tried all measures of comfort without success. Her day nurse who is with her today says that the only things that she can do to get Renee [name] calm is to put her in the wheelchair and take her for a walk and when she stops that Renee [name] begins crying. She also notes that Renee [name] will be calmer when riding in the [place], but begins crying when the [place] stops. The nurse has also noted that things that Renee [name] previously enjoyed does not hold her interest, such as watching episodes of "The Price is Right". Mom has given her Diazepam that she has a PRN order for, and says that this has  reduced the crying to more of a whimper for a short time but has not calmed Renee [name]. She says that Renee [name] sleeps for only short times and awakens crying.   Mom wonders if Renee [name] is experiencing pain in her right hip and leg. Mom says that the had a pathological fracture in the right femur in October, and then after the femur healed that a bone spur protruded through the skin on her hip. She was given a local anesthetic and the bone spur was shaved off. Mom feels that Renee [name] has been crying more since the fracture occurred and wonders if she is having bone pain from the fracture.  Renee [name]'s mother says that Renee [name] had an episode of pneumonia since she was last seen and weathered that well. Mom notes that since the femur fracture that she has more episodes of constipation and that she tends to have more urinary retention. Renee [name] typically does not urinate and has to be catheterized, and Mom has noted that the urine volume is much larger than it used to be. She has had no urinary tract infections. Mom has has no other health concerns for Renee [name] today other than previously mentioned.  Review of Systems: Please see the HPI for neurologic and other pertinent review of systems. Otherwise, the following systems are noncontributory including constitutional, eyes, ears, nose and throat, cardiovascular, respiratory, gastrointestinal, genitourinary, musculoskeletal, skin, endocrine, hematologic/lymph, allergic/immunologic and psychiatric.   Past Medical History:  Diagnosis Date  . Asthma    breathing treatment  . Broken femur ([place]) 12/23/15  . Cerebral palsy ([place])   . DNI (do  not intubate)   . DNR (do not resuscitate)   . DNR (do not resuscitate) 04/28/2014  . Hip dysplasia    bilateral  . Hydrocephalus   . Mental retardation   . Osteopenia   . Scoliosis   . Seizure disorder ([place])   . UTI (urinary tract infection)    Hospitalizations: Yes.  , Head Injury: No., Nervous System Infections: No., Immunizations up to date: Yes.     Past Medical History Comments: See history  Surgical History Past Surgical History:  Procedure Laterality Date  . GASTROSTOMY TUBE PLACEMENT     at same time as nissen  . ILEOPSOAS RELEASE Bilateral    Renee [name]  . LAPAROSCOPIC NISSEN FUNDOPLICATION    . SPINAL FUSION  2005  . TENOTOMY ADDUCTOR / HAMSTRING CLOSED Bilateral 04/25/00  . TYMPANOSTOMY TUBE PLACEMENT    . vt shunt placement  1989   At birth    Family History family history includes Breast cancer in her maternal grandmother; Heart disease in her maternal grandfather; Thyroid disease in her maternal aunt and maternal grandfather. Family History is otherwise negative for migraines, seizures, cognitive impairment, blindness, deafness, birth defects, chromosomal disorder, autism.  Social History Social History   Social History  . Marital status: Single    Spouse name: N/A  . Number of children: 0  . Years of education: N/A   Occupational History  . disabled Unemployed   Social History Main Topics  . Smoking status: Never Smoker  . Smokeless tobacco: Never Used  . Alcohol use No  . Drug use: No  . Sexual activity: No   Other Topics Concern  . Not on file   Social History Narrative   Renee [name] requires total care in all aspects of daily living. She lives with her mother, who receives some help from a home health aide for Renee [name]. Renee [name] enjoys watching TV.    Allergies Allergies  Allergen Reactions  . Other     Paper and adhesive tape - blisters  . Penicillins Rash    Has patient had a PCN reaction causing immediate rash, facial/tongue/throat swelling, SOB or lightheadedness with hypotension: NO Has patient had a PCN reaction causing severe rash involving mucus membranes or skin necrosis: NO Has patient had a PCN reaction that required hospitalization: NO Has patient had a PCN reaction occurring within the last 10 years: NO If all of the above answers are "NO", then may proceed with Cephalosporin use.   . Latex  Rash and Other (See Comments)    blisters    Physical Exam Pulse 96   Wt 60 lb (27.2 kg) Comment: Mom reported  BMI 18.31 kg/m  I was unable to obtain a blood pressure due to Renee [name]'s crying and resisting the procedure General: thin female with increased tone, contractures and wasting of her arms and legs, lying on the exam table  Head: oxycephaly, cranial encephalocele, mid-face hypoplasia  Neck: supple with no carotid or supraclavicular bruits.  Respiratory: lungs clear to auscultation  Cardiovascular: regular rate and rhythm, no murmurs  Musculoskeletal: increased tone, fixed contractures and atrophy of her limbs, with some sparing of the right arm.  Skin: She has callouses on her some of her fingers from chewing on them. She has mild redness on her bony prominences but no areas of broken skin.  Neurologic Exam  Mental Status: She has severe mental retardation. She has no language. 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 She was crying inconsolably during the entire  visit today. Cranial Nerves: She has dysconjugate roving eye movements. I could not illicit a red reflex. She occasionally turned to localize sounds but I could not get her to do so consistently. She has lower facial weakness with drooling. She does not protrude the tongue but it appears to be midline.  Motor: Increased tone and rigidity in all extremities. There is some sparing of the right arm, which can be almost fully extended. She has fixed contractures and atrophy in the left arm and both lower extremities. She has tight heel cords. She occasionally rubs her mouth and her head with her right hand.  Sensory: Withdrawal x 4.  Coordination: Unable to follow instructions to assess coordination  Gait and Station: Wheelchair bound - unable to stand or bear weight Reflexes: 1+ in the right upper extremity and absent in the other extremities. I could not illicit clonus.  Impression 1. Semilobar holoprosencephaly  with a dorsal third ventricle cyst, agenesis of the corpus callosum and cranial encephalocele. 2. Minor motor seizures 3. Significant intellectual delay 4. Spastic quadriparesis 5. Severe dysphagia 6. Decreased visual acuity 7. Agitation and irritability  Recommendations for plan of care The patient's previous [facility] records were reviewed. Renee [name] has neither had nor required imaging or lab studies since the last visit. 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 Renee [name] has episodes of myoclonus as well as minor motor seizures that intermittently increase in frequency. Renee [name] has been irritable and crying for the past 6 weeks for reasons that are unclear. She may be having pain in her right upper leg and hip, but it is difficult to determine. I consulted with Renee Gaynell Face and he recommended increasing the Tizanidine dose to 2 tablets 3 times per day. I talked with Mom and she agreed with this plan. We also talked about applying warm moist heat to Renee [name]'s right hip area to see if that would provide any pain relief. Mom is aware that this is a difficult situation to provide relief for Cherry without over sedating her. I asked Mom to call if she has questions or concerns. Mom is a strong advocate for [place] and is devoted to her care. I will see Renee [name] back in follow up in 6 months or sooner if needed  The medication list was reviewed and reconciled.  No changes were made in the prescribed medications today.  A complete medication list was provided to the patient/caregiver.  Allergies as of 07/13/2016      Reactions   Other    Paper and adhesive tape - blisters   Penicillins Rash   Has patient had a PCN reaction causing immediate rash,  facial/tongue/throat swelling, SOB or lightheadedness with hypotension: NO Has patient had a PCN reaction causing severe rash involving mucus membranes or skin necrosis: NO Has patient had a PCN reaction that required hospitalization: NO Has patient had a PCN reaction occurring within the last 10 years: NO If all of the above answers are "NO", then may proceed with Cephalosporin use.   Latex Rash, Other (See Comments)   blisters      Medication List       Accurate as of 07/13/16 10:10 AM. Always use your most recent med list.  albuterol (2.5 MG/3ML) 0.083% nebulizer solution Commonly known as:  PROVENTIL Take 3 mLs (2.5 mg total) by nebulization every 6 (six) hours as needed for wheezing or shortness of breath.   ALPRAZolam 0.25 MG tablet Commonly known as:  XANAX Give 1/4 to 1/2 tablet up to twice per day as needed for severe irritability   arformoterol 15 MCG/2ML Nebu Commonly known as:  BROVANA Take 2 mLs (15 mcg total) by nebulization 2 (two) times daily.   baclofen 10 MG tablet Commonly known as:  LIORESAL TAKE 2 TABLETS AT 7AM, 1 AT 11AM, 2 AT 3PM, AND 2 AT 8PM.   budesonide 0.25 MG/2ML nebulizer solution Commonly known as:  PULMICORT Take 2 mLs (0.25 mg total) by nebulization 2 (two) times daily.   diazepam 1 MG/ML solution Commonly known as:  VALIUM Give 24ml every 8 hours as needed for agitation   feeding supplement (JEVITY 1.5 CAL/FIBER) Liqd Place 237 mLs into feeding tube 4 (four) times daily. 1 can at 0700, 1100, 1500, and 2000.   HYDROcodone-acetaminophen 5-325 MG tablet Commonly known as:  NORCO/VICODIN Take 1 tablet by mouth every 6 (six) hours as needed for moderate pain.   levofloxacin 25 MG/ML solution Commonly known as:  LEVAQUIN Place 2 mLs (50 mg total) into feeding tube 2 (two) times daily.   medroxyPROGESTERone 150 MG/ML injection Commonly known as:  DEPO-PROVERA INJECT 1ML INTO THE MUSCLE EVERY 3 MONTHS.   polyethylene glycol  powder powder Commonly known as:  MIRALAX Take 1 capful every day. Titrate as needed. - via peg tube -  as needed for constipation   primidone 50 MG tablet Commonly known as:  MYSOLINE TAKE 4 TABLETS EVERY MORNING,4 TABLETS IN THE AFTERNOON, AND 4 TABLETS IN THE EVENING.   ranitidine 150 MG/10ML syrup Commonly known as:  ZANTAC Place 10 mLs (150 mg total) into feeding tube 2 (two) times daily.   [name] 0.5 MG tablet Commonly known as:  RISPERDAL TAKE 3 TABS VIA G TUBE AT 7AM, 3 TABS AT 11AM, 3 TABS AT 3PM AND 1 TAB AT BEDTIME.   tiZANidine 2 MG tablet Commonly known as:  ZANAFLEX Crush and give via G-tube - 2 tablets at 7AM, 2 tablets at noon, 2 tablets at 6pm. May give additional 1 tablet during the night if needed for spasms       Renee. Gaynell Face was consulted regarding the patient.   Total time spent with the patient was 30 minutes, of which 50% or more was spent in counseling and coordination of care.   Renee Germany NP-C

## 2016-07-13 NOTE — Patient Instructions (Signed)
We will increase the Tizanidine 2mg  to 2 tablets 3 times per day. You may give an additional 1 tablet during the night if needed.   Try some warm moist heat packs for 10-15 minutes at a time to Renee [name]'s right hip to see if that gives her any relief.   Please call if you have questions or concerns.   I will see Renee [name] back in follow up in 6 months or sooner if needed.

## 2016-07-19 IMAGING — CR DG CHEST 1V PORT
1 series · 1 of 1 positions shown · non-contrast
Comparison: Single view of the chest 02/04/2014.

CLINICAL DATA: Fever, hematuria, vomiting.

EXAM:
PORTABLE CHEST - 1 VIEW

[AP]
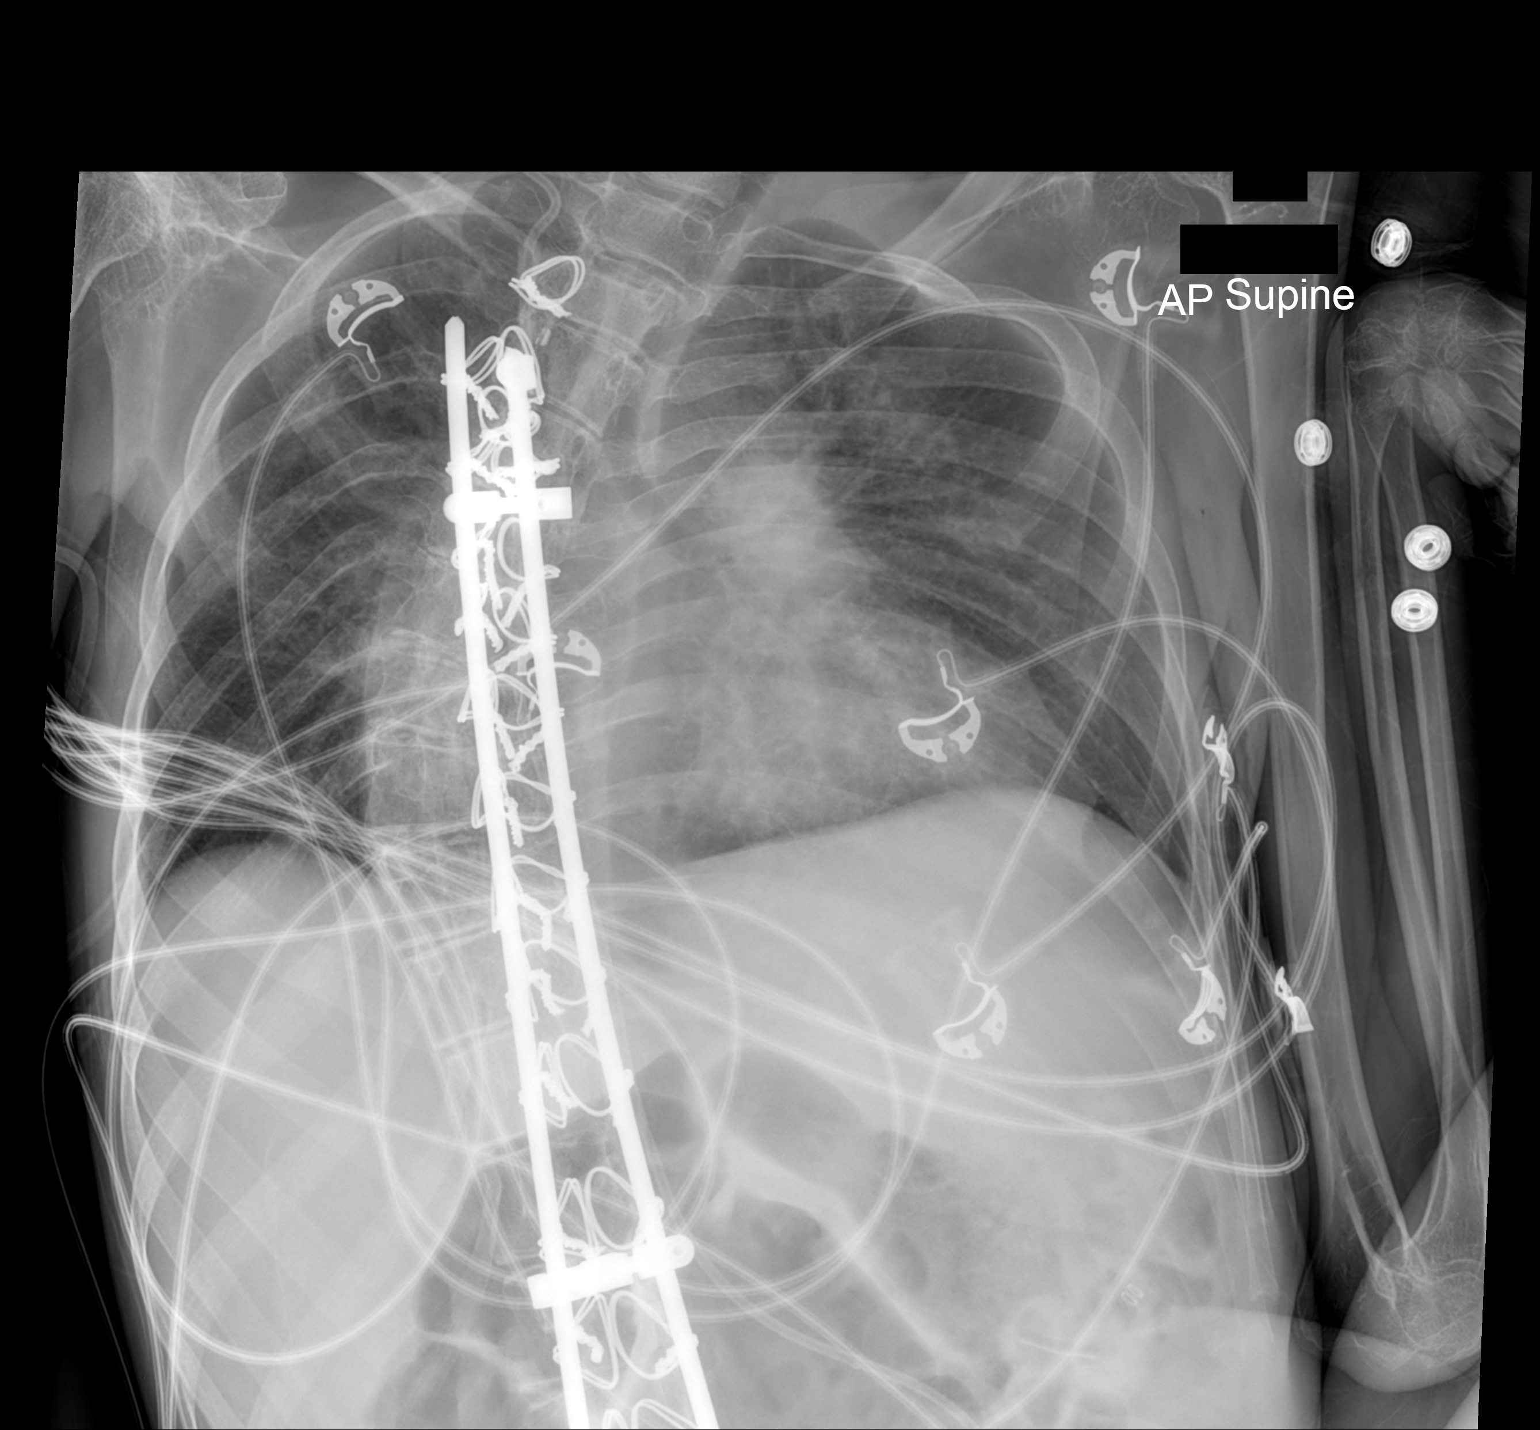

[1 of 1 positions shown; findings below may reference images not displayed]

FINDINGS: Lungs are clear. Heart size is normal. No pneumothorax or pleural
effusion. Spinal stabilization hardware in ventriculostomy shunt
catheter are again seen. No acute abnormality.
IMPRESSION: No acute disease.

## 2016-07-19 IMAGING — DX DG ABDOMEN 1V
1 series · 1 of 1 positions shown · non-contrast
Comparison: Abdominal CT 08/02/2010.

CLINICAL DATA: Abdominal pain.  History of cervical palsy.

EXAM:
ABDOMEN - 1 VIEW

[abdomen kub]
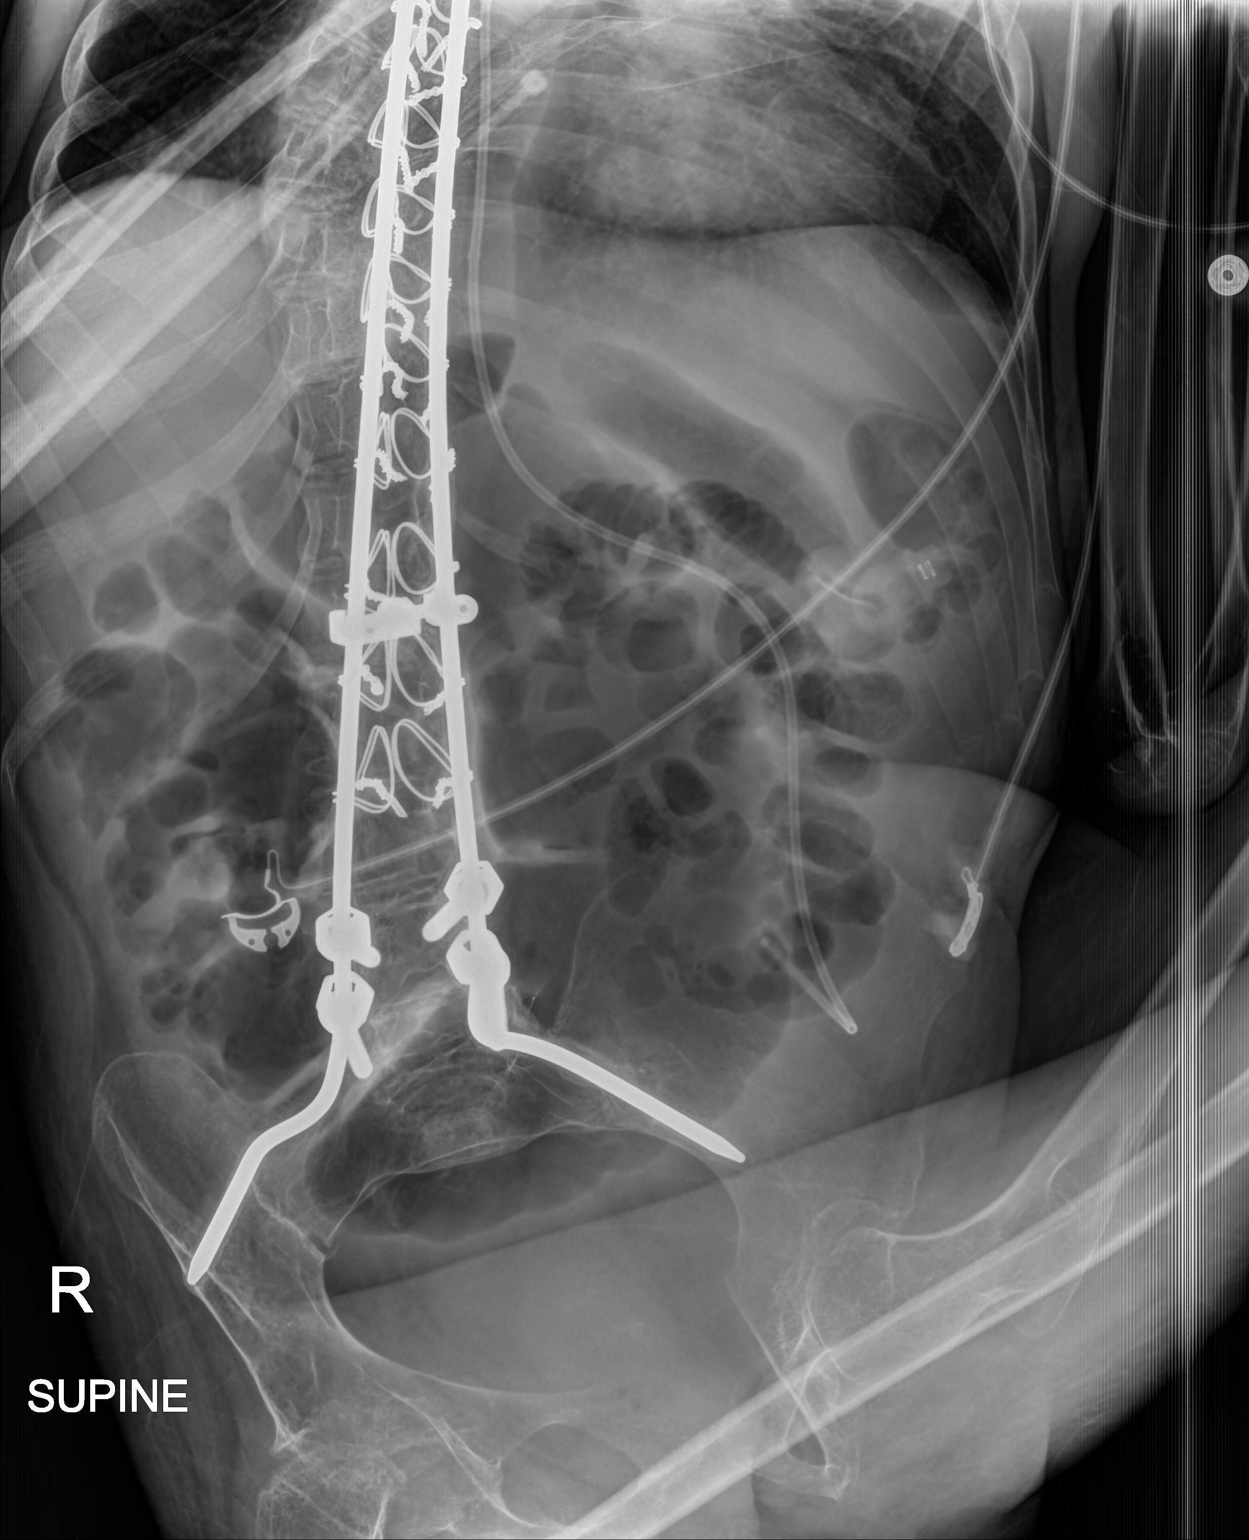

[1 of 1 positions shown; findings below may reference images not displayed]

FINDINGS: Generalized colonic distention is similar to priors. There is no
evidence of bowel wall thickening, pneumatosis or free
intraperitoneal air. Harrington rods, ventriculoperitoneal shunt,
scoliosis and findings of bilateral congenital hip dysplasia are
noted.
IMPRESSION: No definite acute findings. Colonic distention is similar to priors.

## 2016-07-24 ENCOUNTER — Other Ambulatory Visit (HOSPITAL_COMMUNITY)
Admission: RE | Admit: 2016-07-24 | Discharge: 2016-07-24 | Disposition: A | Discharge status: Home / Self Care | Payer: 59 | Source: Other Acute Inpatient Hospital | Attending: Urology | Admitting: Urology

## 2016-07-24 ENCOUNTER — Ambulatory Visit (INDEPENDENT_AMBULATORY_CARE_PROVIDER_SITE_OTHER): Payer: 59 | Admitting: Urology

## 2016-07-24 DIAGNOSIS — R3914 Feeling of incomplete bladder emptying: Secondary | ICD-10-CM | POA: Diagnosis not present

## 2016-07-24 DIAGNOSIS — N3000 Acute cystitis without hematuria: Secondary | ICD-10-CM | POA: Insufficient documentation

## 2016-07-27 ENCOUNTER — Other Ambulatory Visit: Payer: Self-pay | Admitting: Urology

## 2016-07-27 DIAGNOSIS — N312 Flaccid neuropathic bladder, not elsewhere classified: Secondary | ICD-10-CM

## 2016-07-27 LAB — URINE CULTURE

## 2016-07-28 ENCOUNTER — Other Ambulatory Visit (INDEPENDENT_AMBULATORY_CARE_PROVIDER_SITE_OTHER): Payer: Self-pay | Admitting: Family

## 2016-07-30 ENCOUNTER — Encounter: Payer: Self-pay | Admitting: *Deleted

## 2016-07-30 ENCOUNTER — Ambulatory Visit (INDEPENDENT_AMBULATORY_CARE_PROVIDER_SITE_OTHER): Payer: 59 | Admitting: *Deleted

## 2016-07-30 DIAGNOSIS — Z3042 Encounter for surveillance of injectable contraceptive: Secondary | ICD-10-CM

## 2016-07-30 MED ORDER — MEDROXYPROGESTERONE ACETATE 150 MG/ML IM SUSP
150.0000 mg | Freq: Once | INTRAMUSCULAR | Status: AC
  Administered 2016-07-30: 150 mg via INTRAMUSCULAR

## 2016-07-30 NOTE — Progress Notes (Signed)
Pt here for Depo. Pt is on Depo for period management. Pt is NOT SEXUALLY ACTIVE. No pregnancy test needed. Pt tolerated shot well. Return in 12 weeks for next shot. [place]

## 2016-08-01 ENCOUNTER — Ambulatory Visit (HOSPITAL_COMMUNITY): Payer: 59

## 2016-08-01 ENCOUNTER — Ambulatory Visit (HOSPITAL_COMMUNITY)
Admission: RE | Admit: 2016-08-01 | Discharge: 2016-08-01 | Disposition: A | Discharge status: Home / Self Care | Payer: 59 | Source: Ambulatory Visit | Attending: Urology | Admitting: Urology

## 2016-08-01 DIAGNOSIS — N312 Flaccid neuropathic bladder, not elsewhere classified: Secondary | ICD-10-CM | POA: Diagnosis not present

## 2016-08-06 ENCOUNTER — Emergency Department (HOSPITAL_COMMUNITY): Payer: 59

## 2016-08-06 ENCOUNTER — Encounter (HOSPITAL_COMMUNITY): Payer: Self-pay | Admitting: Emergency Medicine

## 2016-08-06 ENCOUNTER — Emergency Department (HOSPITAL_COMMUNITY)
Admission: EM | Admit: 2016-08-06 | Discharge: 2016-08-06 | Disposition: A | Discharge status: Home / Self Care | Payer: 59 | Attending: Family Medicine | Admitting: Family Medicine

## 2016-08-06 ENCOUNTER — Telehealth: Payer: Self-pay | Admitting: Gastroenterology

## 2016-08-06 DIAGNOSIS — R140 Abdominal distension (gaseous): Secondary | ICD-10-CM | POA: Diagnosis present

## 2016-08-06 DIAGNOSIS — J45909 Unspecified asthma, uncomplicated: Secondary | ICD-10-CM | POA: Insufficient documentation

## 2016-08-06 DIAGNOSIS — E43 Unspecified severe protein-calorie malnutrition: Secondary | ICD-10-CM | POA: Diagnosis present

## 2016-08-06 DIAGNOSIS — J4530 Mild persistent asthma, uncomplicated: Secondary | ICD-10-CM | POA: Diagnosis present

## 2016-08-06 DIAGNOSIS — G809 Cerebral palsy, unspecified: Secondary | ICD-10-CM | POA: Diagnosis not present

## 2016-08-06 DIAGNOSIS — Q043 Other reduction deformities of brain: Secondary | ICD-10-CM

## 2016-08-06 DIAGNOSIS — N390 Urinary tract infection, site not specified: Secondary | ICD-10-CM | POA: Insufficient documentation

## 2016-08-06 DIAGNOSIS — D473 Essential (hemorrhagic) thrombocythemia: Secondary | ICD-10-CM | POA: Diagnosis present

## 2016-08-06 DIAGNOSIS — Z91040 Latex allergy status: Secondary | ICD-10-CM | POA: Insufficient documentation

## 2016-08-06 DIAGNOSIS — J181 Lobar pneumonia, unspecified organism: Secondary | ICD-10-CM | POA: Insufficient documentation

## 2016-08-06 DIAGNOSIS — R509 Fever, unspecified: Secondary | ICD-10-CM

## 2016-08-06 DIAGNOSIS — R109 Unspecified abdominal pain: Secondary | ICD-10-CM | POA: Insufficient documentation

## 2016-08-06 DIAGNOSIS — K219 Gastro-esophageal reflux disease without esophagitis: Secondary | ICD-10-CM | POA: Diagnosis present

## 2016-08-06 DIAGNOSIS — G40319 Generalized idiopathic epilepsy and epileptic syndromes, intractable, without status epilepticus: Secondary | ICD-10-CM | POA: Diagnosis present

## 2016-08-06 DIAGNOSIS — G808 Other cerebral palsy: Secondary | ICD-10-CM | POA: Diagnosis present

## 2016-08-06 DIAGNOSIS — D75839 Thrombocytosis, unspecified: Secondary | ICD-10-CM | POA: Diagnosis present

## 2016-08-06 DIAGNOSIS — R454 Irritability and anger: Secondary | ICD-10-CM | POA: Diagnosis present

## 2016-08-06 DIAGNOSIS — Z79899 Other long term (current) drug therapy: Secondary | ICD-10-CM | POA: Insufficient documentation

## 2016-08-06 DIAGNOSIS — J690 Pneumonitis due to inhalation of food and vomit: Secondary | ICD-10-CM | POA: Diagnosis present

## 2016-08-06 DIAGNOSIS — J189 Pneumonia, unspecified organism: Secondary | ICD-10-CM

## 2016-08-06 DIAGNOSIS — S7291XA Unspecified fracture of right femur, initial encounter for closed fracture: Secondary | ICD-10-CM | POA: Diagnosis present

## 2016-08-06 LAB — I-STAT BETA HCG BLOOD, ED (MC, WL, AP ONLY)

## 2016-08-06 LAB — COMPREHENSIVE METABOLIC PANEL
ALT: 18 U/L (ref 14–54)
AST: 19 U/L (ref 15–41)
Albumin: 2.9 g/dL — ABNORMAL LOW (ref 3.5–5.0)
Alkaline Phosphatase: 123 U/L (ref 38–126)
Anion gap: 8 (ref 5–15)
BUN: 7 mg/dL (ref 6–20)
CHLORIDE: 95 mmol/L — AB (ref 101–111)
CO2: 32 mmol/L (ref 22–32)
Calcium: 8.2 mg/dL — ABNORMAL LOW (ref 8.9–10.3)
Creatinine, Ser: <0.3 mg/dL — ABNORMAL LOW (ref 0.44–1.00)
Glucose, Bld: 127 mg/dL — ABNORMAL HIGH (ref 65–99)
POTASSIUM: 3.6 mmol/L (ref 3.5–5.1)
SODIUM: 135 mmol/L (ref 135–145)
Total Bilirubin: 0.2 mg/dL — ABNORMAL LOW (ref 0.3–1.2)
Total Protein: 6.8 g/dL (ref 6.5–8.1)

## 2016-08-06 LAB — CBC
HEMATOCRIT: 38.6 % (ref 36.0–46.0)
Hemoglobin: 13.1 g/dL (ref 12.0–15.0)
MCH: 33 pg (ref 26.0–34.0)
MCHC: 33.9 g/dL (ref 30.0–36.0)
MCV: 97.2 fL (ref 78.0–100.0)
Platelets: 458 10*3/uL — ABNORMAL HIGH (ref 150–400)
RBC: 3.97 MIL/uL (ref 3.87–5.11)
RDW: 12.8 % (ref 11.5–15.5)
WBC: 16.3 10*3/uL — AB (ref 4.0–10.5)

## 2016-08-06 LAB — URINALYSIS, ROUTINE W REFLEX MICROSCOPIC
BILIRUBIN URINE: NEGATIVE
Glucose, UA: NEGATIVE mg/dL
KETONES UR: NEGATIVE mg/dL
Nitrite: NEGATIVE
PROTEIN: 30 mg/dL — AB
SQUAMOUS EPITHELIAL / LPF: NONE SEEN
Specific Gravity, Urine: 1.014 (ref 1.005–1.030)
pH: 7 (ref 5.0–8.0)

## 2016-08-06 LAB — LIPASE, BLOOD: LIPASE: 22 U/L (ref 11–51)

## 2016-08-06 MED ORDER — AZITHROMYCIN 200 MG/5ML PO SUSR: 250.0000 mg | Freq: Every day | ORAL | 0 refills | Status: DC

## 2016-08-06 MED ORDER — AMOXICILLIN-POT CLAVULANATE 250-62.5 MG/5ML PO SUSR: 500.0000 mg | Freq: Three times a day (TID) | ORAL | 0 refills | Status: DC

## 2016-08-06 MED ORDER — SODIUM CHLORIDE 0.9 % IV BOLUS (SEPSIS)
500.0000 mL | Freq: Once | INTRAVENOUS | Status: AC
  Administered 2016-08-06: 500 mL via INTRAVENOUS

## 2016-08-06 MED ORDER — DEXTROSE 5 % IV SOLN
1.0000 g | Freq: Once | INTRAVENOUS | Status: AC
  Administered 2016-08-06: 1 g via INTRAVENOUS
  Filled 2016-08-06: qty 10

## 2016-08-06 MED ORDER — DEXTROSE 5 % IV SOLN
500.0000 mg | Freq: Once | INTRAVENOUS | Status: AC
  Administered 2016-08-06: 500 mg via INTRAVENOUS
  Filled 2016-08-06: qty 500

## 2016-08-06 MED ORDER — IOPAMIDOL (ISOVUE-300) INJECTION 61%
INTRAVENOUS | Status: AC
  Administered 2016-08-06: 60 mL
  Filled 2016-08-06: qty 75

## 2016-08-06 NOTE — ED Notes (Signed)
Mother and caregiver verbalize understanding of discharge instructions, prescriptions, home care and follow up care. Patient out of here via wheelchair with mother and caregiver at this time

## 2016-08-06 NOTE — ED Notes (Signed)
Two RN's have tried to start IV's unsuccessfully. Will notify physician and ask another RN.

## 2016-08-06 NOTE — ED Notes (Signed)
Bed: WA01 Expected date:  Expected time:  Means of arrival:  Comments: Triage M.W.

## 2016-08-06 NOTE — ED Provider Notes (Signed)
[facility] DEPT Provider Note   CSN: [id number] Arrival date & time: 08/06/16  1304     History   Chief Complaint Chief Complaint  Patient presents with  . Abdominal Pain  . Blood in feeding tube    HPI Renee [name] is a 31 y.o. female.  HPI Abdominal bloating over the last week.  Increased swelling.  No response to the laxative.  Also giving gas medications but having increased gassy output.  Some redness and blood noticed around feeding tube.  On Friday , fever up to 101.  OK over the weekend that increased today.  Discussed with [facility], sent to the ED. Past Medical History:  Diagnosis Date  . Asthma    breathing treatment  . Bone spur   . Broken femur ([place]) 12/23/15  . Cerebral palsy ([place])   . DNI (do not intubate)   . DNR (do not resuscitate)   . DNR (do not resuscitate) 04/28/2014  . Hip dysplasia    bilateral  . Hydrocephalus   . Mental retardation   . Osteopenia   . Scoliosis   . Seizure disorder ([place])   . UTI (urinary tract infection)     Patient Active Problem List   Diagnosis Date Noted  . Closed fracture of right femur ([place])   . Asthma 12/24/2015  . Closed right hip fracture ([place]) 12/23/2015  . 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 Acute bronchitis 04/26/2015  . DNR (do not resuscitate) 04/28/2014  . Sepsis ([place]) 04/26/2014  . UTI (lower urinary tract infection) 04/26/2014  . Cellulitis of hip 04/26/2014  . LPRD (laryngopharyngeal reflux disease) 03/09/2014  . Protein-calorie malnutrition, severe ([place]) 12/25/2013  . Malnourished ([place]) 12/25/2013  . Aspiration pneumonia ([place]) 12/24/2013  . Acute and chronic respiratory failure 12/24/2013  . Broken or cracked tooth, nontraumatic 06/03/2013  . 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 Severe intellectual disabilities 01/02/2013  . 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 Irritability 01/02/2013  . Other and unspecified special symptom or syndrome, not elsewhere classified 01/02/2013  . 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 Presence of cerebrospinal fluid drainage device 01/02/2013  . Unspecified constipation 01/02/2013  . Myoclonus 01/02/2013  . Infantile cerebral palsy ([place]) 10/14/2009  . Mild persistent asthma 10/14/2009    Past Surgical History:  Procedure Laterality Date  . GASTROSTOMY TUBE PLACEMENT     at same time as nissen  . ILEOPSOAS RELEASE Bilateral    Dr Susy Frizzle  . LAPAROSCOPIC NISSEN FUNDOPLICATION    . SPINAL FUSION  2005  . TENOTOMY ADDUCTOR / HAMSTRING CLOSED Bilateral 04/25/00  . TYMPANOSTOMY TUBE PLACEMENT    . vt shunt placement  1989   At birth    OB History    Gravida Para Term Preterm AB Living   0 0 0 0 0 0   SAB TAB Ectopic Multiple Live Births   0 0 0 0         Home Medications    Prior to Admission medications   Medication Sig Start Date End Date Taking? Authorizing Provider  albuterol (PROVENTIL) (2.5 MG/3ML) 0.083% nebulizer solution Take 3 mLs (2.5 mg total) by nebulization every 6 (six) hours as needed for wheezing or shortness of breath. 09/09/15  Yes [name], MD  ALPRAZolam Duanne Moron) 0.25 MG tablet Give 1/4 to 1/2 tablet up to twice per day as needed for severe irritability 06/20/15  Yes Rockwell Germany, NP  arformoterol (BROVANA) 15 MCG/2ML NEBU Take 2 mLs (15 mcg total) by nebulization 2 (two) times daily. 09/09/15  Yes [name], MD  baclofen (LIORESAL) 10 MG tablet TAKE 2 TABLETS AT 7AM, 1 AT 11AM, 2 AT 3PM, AND 2 AT 8PM. 07/30/16  Yes Rockwell Germany, NP  diazepam (VALIUM) 1 MG/ML solution Give 71ml every 8 hours as needed for agitation 07/09/16  Yes Rockwell Germany, NP  HYDROcodone-acetaminophen (NORCO/VICODIN) 5-325 MG tablet Take 1 tablet by mouth every 6 (six) hours as needed for moderate pain. 12/26/15  Yes Maryann Mikhail, DO  medroxyPROGESTERone (DEPO-PROVERA) 150 MG/ML injection INJECT 1ML  INTO THE MUSCLE EVERY 3 MONTHS. 11/09/15  Yes Estill Dooms, NP  Nutritional Supplements (FEEDING SUPPLEMENT, JEVITY 1.5 CAL/FIBER,) LIQD Place 237 mLs into feeding tube 4 (four) times daily. 1 can at 0700, 1100, 1500, and 2000.   Yes Historical Provider, MD  polyethylene glycol powder (MIRALAX) powder Take 1 capful every day. Titrate as needed. - via peg tube -  as needed for constipation 04/26/16  Yes Manus Gunning, MD  primidone (MYSOLINE) 50 MG tablet TAKE 4 TABLETS EVERY MORNING,4 TABLETS IN THE AFTERNOON, AND 4 TABLETS IN THE EVENING. 06/25/16  Yes Rockwell Germany, NP  ranitidine (ZANTAC) 150 MG/10ML syrup Place 10 mLs (150 mg total) into feeding tube 2 (two) times daily. 04/26/16  Yes Manus Gunning, MD  [name] (RISPERDAL) 0.5 MG tablet TAKE 3 TABS VIA G TUBE AT 7AM, 3 TABS AT 11AM, 3 TABS AT 3PM AND 1 TAB AT BEDTIME. 05/29/16  Yes Rockwell Germany, NP  tiZANidine (ZANAFLEX) 2 MG tablet Crush and give via G-tube - 2 tablets at 7AM, 2 tablets at noon, 2 tablets at 6pm. May give additional 1 tablet during the night if needed for spasms 07/13/16  Yes Rockwell Germany, NP  budesonide (PULMICORT) 0.25 MG/2ML nebulizer solution Take 2 mLs (0.25 mg total) by nebulization 2 (two) times daily. Patient not taking: Reported on 08/06/2016 12/14/15   [name], MD  levofloxacin (LEVAQUIN) 25 MG/ML solution Place 2 mLs (50 mg total) into feeding tube 2 (two) times daily. Patient not taking: Reported on 08/06/2016 06/28/16   [name], MD    Family History Family History  Problem Relation Age of Onset  . Breast cancer Maternal Grandmother   . Heart disease Maternal Grandfather   . Thyroid disease Maternal Grandfather   . Thyroid disease Maternal Aunt     Social History Social History  Substance Use Topics  . Smoking status: Never Smoker  . Smokeless tobacco: Never Used  . Alcohol use No     Allergies   Other; Penicillins; and Latex   Review of Systems Review of  Systems  All other systems reviewed and are negative.    Physical Exam Updated Vital Signs BP (!) 90/51 (BP Location: Right Arm)   Pulse 113   Temp 98.3 F (36.8 C) (Axillary)   Resp 20   Wt 27.2 kg   SpO2 92%   BMI 18.31 kg/m   Physical Exam  Constitutional: No distress.  Crying out intermittently.  HENT:  Head: Normocephalic and atraumatic.  Right Ear: External ear normal.  Left Ear: External ear normal.  Eyes: Conjunctivae are normal. Right eye exhibits no discharge. Left eye exhibits no discharge. No scleral icterus.  Neck: Neck supple. No tracheal deviation present.  Cardiovascular: Normal rate, regular rhythm and intact distal pulses.   Pulmonary/Chest: Effort normal and breath sounds normal. No stridor. No respiratory distress. She has no wheezes.  She has no rales.  Abdominal: Soft. Bowel sounds are normal. She exhibits distension. There is no tenderness. There is no rebound and no guarding.  No masses, mild redness around the feeding tube, no surrounding erythema, abdomen distended, soft  Musculoskeletal: She exhibits no edema or tenderness.  Neurological: She is alert. She displays atrophy. No cranial nerve deficit (no facial droop, extraocular movements intact, no slurred speech) or sensory deficit. She exhibits normal muscle tone. She displays no seizure activity. Coordination abnormal.  Pt is non verbal  Skin: Skin is warm and dry. No rash noted.  Psychiatric: She has a normal mood and affect.  Nursing note and vitals reviewed.    ED Treatments / Results  Labs (all labs ordered are listed, but only abnormal results are displayed) Labs Reviewed  COMPREHENSIVE METABOLIC PANEL - Abnormal; Notable for the following:       Result Value   Chloride 95 (*)    Glucose, Bld 127 (*)    Creatinine, Ser <0.30 (*)    Calcium 8.2 (*)    Albumin 2.9 (*)    Total Bilirubin 0.2 (*)    All other components within normal limits  CBC - Abnormal; Notable for the following:      WBC 16.3 (*)    Platelets 458 (*)    All other components within normal limits  URINALYSIS, ROUTINE W REFLEX MICROSCOPIC - Abnormal; Notable for the following:    Color, Urine AMBER (*)    APPearance CLOUDY (*)    Hgb urine dipstick SMALL (*)    Protein, ur 30 (*)    Leukocytes, UA LARGE (*)    Bacteria, UA MANY (*)    All other components within normal limits  URINE CULTURE  LIPASE, BLOOD  I-STAT BETA HCG BLOOD, ED ([facility], [facility], [facility] ONLY)     Radiology Dg Chest 1 View  Result Date: 08/06/2016 CLINICAL DATA:  31 year old female with 1 week of abdominal pain. Blood and redness at feeding tube insertion site. Fever of 101 Fahrenheit recently. Initial encounter. EXAM: CHEST 1 VIEW COMPARISON:  Portable chest x-ray 05/09/2015 and earlier. FINDINGS: Semi upright AP view of the chest at 1505 hours. New confluent but somewhat indistinct appearing right mid lung opacity (arrows). Left lung ventilation appears stable from prior studies. Stable cardiac size and mediastinal contours. No definite pleural effusion. No pulmonary edema. Superimposed chronic thoracolumbar spinal hardware. Chronic CSF shunt tracking into the left abdomen. Stable visualized osseous structures. Partially visible gaseous distension of bowel in the upper abdomen. IMPRESSION: 1. New confluent but indistinct appearing right mid lung opacity. Consider right lung pneumonia and/or aspiration. CT Abdomen and Pelvis is pending at this time, and I have directed the CT techs to please also include up to the right mid lung on those images. 2. Otherwise stable chest. Electronically Signed   By: Genevie Ann M.D.   On: 08/06/2016 15:23    Procedures Procedures (including critical care time)  Medications Ordered in ED Medications  sodium chloride 0.9 % bolus 500 mL (0 mLs Intravenous Stopped 08/06/16 1710)  iopamidol (ISOVUE-300) 61 % injection (60 mLs  Contrast Given 08/06/16 1730)  cefTRIAXone (ROCEPHIN) 1 g in dextrose 5 % 50 mL IVPB (0 g  Intravenous Stopped 08/06/16 1710)     Initial Impression / Assessment and Plan / ED Course  I have reviewed the triage vital signs and the nursing notes.  Pertinent labs & imaging results that were available during my care of the patient were reviewed by  me and considered in my medical decision making (see chart for details).  Clinical Course     Ua shows a uti.  This is the likely source of her infection.  With her abdominal pain and swelling, CT scan ordered as well.  CXR abnormal.  Will evaluate further with the CT scan.  Discussed with Dr Johnney Killian who will follow up on the study  Final Clinical Impressions(s) / ED Diagnoses   Final diagnoses:  Urinary tract infection without hematuria, site unspecified    New Prescriptions New Prescriptions   No medications on file     Dorie Rank, MD 08/06/16 1737

## 2016-08-06 NOTE — Telephone Encounter (Signed)
She may want to increase the miralax if they think she is constipated. Not sure if the fever is related at all but they should go get checked out by primary care if she is having fevers. thanks

## 2016-08-06 NOTE — Telephone Encounter (Signed)
Does her PEG seem to be working okay / flushing okay, or do they feel it is out of place? They should continue miralax to produce bowel movements, she has had impaction in the past and severe constipation. Has she had constipation recently at all? They should also be using zantac for reflux. She has a history of aspiration pneumonia and if fever recurs they need to seek evaluation for this.   If you can provide some answers to some of these questions I will let you know more recommendations. Thanks

## 2016-08-06 NOTE — Telephone Encounter (Signed)
Mother states that tube is working correctly, does not feel it is out of place. When I called her back, she said that Renee [name] now has a 100 degree fever. Last bout of constipation was 2 weeks ago. Used Miralax but states it took longer for any results to occur and was less than her normal amount of stool after such episodes. They do use the zantac as directed for reflux.

## 2016-08-06 NOTE — Telephone Encounter (Signed)
Spoke to patient's mother, she reports that for past week has noticed that Renee [name]'s abdomen on right side is more distended than normal, increase in gas and around feeding tube has noticed dried blood for same amount of time. She has tried giving her infant gas drops, continues to give Miralax. Patient had "normal bm" today. Mother also states that [name] had a fever on Friday of 101 degree, but none since, she does seem more agitated today. Please advise.

## 2016-08-06 NOTE — ED Provider Notes (Signed)
Renee [name] is accepted at signout from Dr. Tomi Bamberger. Plan is to follow-up on CT results and make definitive disposition. CT does confirm pneumonia and UA is grossly positive for UTI. I have reviewed these results with the caregivers. Renee [name]'s caregivers report that they prefer to treat her at home. They report they do try to keep her hospitalizations to a minimum. The Renee [name] was last treated for pneumonia several months ago with Levaquin. She has not had other antibiotics since that time. They do intermittent catheterization at home. They also give medications and feeds through a feeding tube. The Renee [name]'s caregiver reports she has had Augmentin in the past and no adverse reaction have been identified. At this time, I will have them continue Augmentin by feeding tube as well as Zithromax to cover for pneumonia and UTI. The Renee [name] is awake and alert and does not exhibit respiratory distress or lethargy. Caregivers seem very skilled and are aware of signs and symptoms for which return.   Renee Shanks, MD 08/06/16 2001

## 2016-08-06 NOTE — ED Triage Notes (Signed)
Patient's caregiver reports swelling to the right of abdomen x1 week. Patient has recurrent constipation and has been given medications without relief of swelling to the right side. Patient has been having normal bowel movements and passing gas. Patient has a feeding tube and caregiver replaced the feeding tube on Thursday; some blood noted and redness around the insertion site. Patient had fever of 101 F on Friday. Patient had seizures on Friday. Patient spiked another 2 F today; no seizure activity today. Patient has not urinated today.

## 2016-08-06 NOTE — Telephone Encounter (Signed)
Spoke to Mother, let her know that she may increase miralax if she thinks she is constipated. Let her know that for the fever she needs to contact the PCP to get checked out. Patient's mom, is quite anxious about it and had made the decision to take her to the ER. I suggested that she at least call the PCP to get their input.

## 2016-08-06 NOTE — ED Notes (Signed)
Pt is still getting IV antibiotics. Pt will be discharged following that

## 2016-08-09 LAB — URINE CULTURE: Culture: >=100000 — AB

## 2016-08-10 ENCOUNTER — Telehealth (HOSPITAL_BASED_OUTPATIENT_CLINIC_OR_DEPARTMENT_OTHER): Payer: Self-pay

## 2016-08-10 NOTE — Telephone Encounter (Signed)
Post ED Visit - Positive Culture Follow-up  Culture report reviewed by antimicrobial stewardship pharmacist:  []  Elenor Quinones, Pharm.D. []  Heide Guile, Pharm.D., BCPS []  Parks Neptune, Pharm.D. []  Alycia Rossetti, Pharm.D., BCPS []  [place], Pharm.D., BCPS, AAHIVP []  Legrand Como, Pharm.D., BCPS, AAHIVP []  Milus Glazier, Pharm.D. []  Stephens November, Pharm.DWandalee Ferdinand Arminger, Pharm.D.  Positive urine culture, >/= 100,000 colonies -> E Coli Pt placed on Amoxicillin and Zithromax  Chart reviewed Domenic Moras PA-C "No further treatment"  Dortha Kern 08/10/2016, 10:23 AM

## 2016-08-14 ENCOUNTER — Telehealth: Payer: Self-pay | Admitting: Pulmonary Disease

## 2016-08-14 NOTE — Telephone Encounter (Addendum)
Attempted to contact pt's mother x1. No answer, no option to leave a message. Will try back.

## 2016-08-15 NOTE — Telephone Encounter (Signed)
Attempted to contact pt's mother x2. No answer, no option to leave a message. Will try back.

## 2016-08-15 NOTE — Telephone Encounter (Signed)
Spoke to pt's mother, states that [facility] was unable to fill rx under [facility] d/t not being credentialed with Medicaid.   [facility] and rx for pulmicort has been changed to TP.  [facility] will call back if any problems arise with filling this rx.  Pharmacist will backdate rx and reimburse pt's mother for the rx. atc pt's mother X1 to make aware, no answer, no vm.  Wcb.

## 2016-08-16 NOTE — Telephone Encounter (Signed)
Spoke with pt;s mother and she states everything was taken care of and there is nothing further needed. I will close this encounter.

## 2016-08-25 ENCOUNTER — Other Ambulatory Visit (INDEPENDENT_AMBULATORY_CARE_PROVIDER_SITE_OTHER): Payer: Self-pay | Admitting: Family

## 2016-08-25 DIAGNOSIS — R454 Irritability and anger: Secondary | ICD-10-CM

## 2016-09-05 ENCOUNTER — Telehealth (INDEPENDENT_AMBULATORY_CARE_PROVIDER_SITE_OTHER): Payer: Self-pay

## 2016-09-05 NOTE — Telephone Encounter (Signed)
Dallie Piles, RN with [facility] asking for a return call for a verbal order. Verbal order is to continue CNA and Cath Tech to assist with I/O catheterization and tube feeding. Eustaquio Maize can be reached at work: [phone] or cell: [phone].

## 2016-09-06 NOTE — Telephone Encounter (Signed)
I called and gave verbal orders to [facility]. TG

## 2016-10-06 ENCOUNTER — Other Ambulatory Visit: Payer: Self-pay | Admitting: Adult Health

## 2016-10-20 ENCOUNTER — Other Ambulatory Visit: Payer: Self-pay | Admitting: Adult Health

## 2016-10-20 ENCOUNTER — Other Ambulatory Visit (INDEPENDENT_AMBULATORY_CARE_PROVIDER_SITE_OTHER): Payer: Self-pay | Admitting: Family

## 2016-10-20 DIAGNOSIS — F72 Severe intellectual disabilities: Secondary | ICD-10-CM

## 2016-10-20 DIAGNOSIS — R454 Irritability and anger: Secondary | ICD-10-CM

## 2016-10-22 ENCOUNTER — Ambulatory Visit (INDEPENDENT_AMBULATORY_CARE_PROVIDER_SITE_OTHER): Payer: 59

## 2016-10-22 DIAGNOSIS — Z3042 Encounter for surveillance of injectable contraceptive: Secondary | ICD-10-CM

## 2016-10-22 MED ORDER — MEDROXYPROGESTERONE ACETATE 150 MG/ML IM SUSP
150.0000 mg | Freq: Once | INTRAMUSCULAR | Status: AC
  Administered 2016-10-22: 150 mg via INTRAMUSCULAR

## 2016-10-22 NOTE — Progress Notes (Signed)
PT here for Depo Shot 150 mg IM given LT anterior Thigh. Tolerated well. Return 12 week for next shot. P Dones CNA

## 2016-11-02 ENCOUNTER — Telehealth (INDEPENDENT_AMBULATORY_CARE_PROVIDER_SITE_OTHER): Payer: Self-pay | Admitting: Family

## 2016-11-02 NOTE — Telephone Encounter (Signed)
Dallie Piles with [employer] left a message requesting call back for verbal order to continue [facility] homecare services. I called and spoke with Catalina Lunger and approved continuation of services. TG

## 2016-11-19 ENCOUNTER — Other Ambulatory Visit (INDEPENDENT_AMBULATORY_CARE_PROVIDER_SITE_OTHER): Payer: Self-pay | Admitting: Family

## 2016-11-23 ENCOUNTER — Ambulatory Visit (INDEPENDENT_AMBULATORY_CARE_PROVIDER_SITE_OTHER): Payer: 59

## 2016-11-23 ENCOUNTER — Ambulatory Visit (INDEPENDENT_AMBULATORY_CARE_PROVIDER_SITE_OTHER): Payer: 59 | Admitting: Orthopedic Surgery

## 2016-11-23 ENCOUNTER — Encounter (INDEPENDENT_AMBULATORY_CARE_PROVIDER_SITE_OTHER): Payer: Self-pay | Admitting: Orthopedic Surgery

## 2016-11-23 VITALS — Ht <= 58 in | Wt <= 1120 oz

## 2016-11-23 DIAGNOSIS — M25552 Pain in left hip: Secondary | ICD-10-CM | POA: Diagnosis not present

## 2016-11-23 DIAGNOSIS — M898X5 Other specified disorders of bone, thigh: Secondary | ICD-10-CM | POA: Diagnosis not present

## 2016-11-23 DIAGNOSIS — M25551 Pain in right hip: Secondary | ICD-10-CM | POA: Diagnosis not present

## 2016-11-23 MED ORDER — HYDROCODONE-ACETAMINOPHEN 7.5-325 MG PO TABS: 1.0000 | ORAL_TABLET | Freq: Four times a day (QID) | ORAL | 0 refills | Status: AC | PRN

## 2016-11-23 NOTE — Progress Notes (Signed)
Office Visit Note   Patient: Renee [name]           Date of Birth: Jan 18, 1986           MRN: [id number] Visit Date: 11/23/2016              Requested by: Sharilyn Sites, MD [address] [place], [place] [address] PCP: Purvis Kilts, MD  Chief Complaint  Patient presents with  . Right Hip - Pain  . Left Leg - Pain  . Right Leg - Pain      HPI: Patient is a 31 year old woman with severe contractures of both lower extremities the mother states that the child has been having increasing pain with movement of the left hip and is concerned with increased movement may mean the hip was dislocated. She is status post a right femur fracture and is concerned that the right hip may be dislocated as well. Patient is currently requiring 7.5 mg of Vicodin for pain relief.  Assessment & Plan: Visit Diagnoses:  1. Pain in right femur   2. Pain of left femur   3. Bilateral hip pain     Plan: Prescription written for Vicodin 7.5 mg tablets. No intervention necessary.  Follow-Up Instructions: Return if symptoms worsen or fail to improve.   Ortho Exam  Patient is alert, she is not oriented she is nonverbal., no adenopathy, well-dressed, normal affect, normal respiratory effort. She does cry out in pain. Examination patient has pain with attempted range of motion of both hips. She has fixed contractures of the hip and knees with her knees drawn up towards chest and her heels to her buttocks bilaterally. She has thin frail skin but no open ulcers.  Imaging: Xr Femur Min 2 Views Left  Result Date: 11/23/2016 Two-view radiographs of the left femur shows a congruent joint with no dislocation no evidence of fracture. She does have significant contracture of the knee.  Xr Femur, Min 2 Views Right  Result Date: 11/23/2016 Two-view radiographs of the right hip shows destructive changes of the hip acetabulum and femoral head the previous subtrochanteric fracture is healed well there is no  prominent bony spurs she has complete contracture of the knee.  Xr Pelvis 1-2 Views  Result Date: 11/23/2016 AP pelvis shows no evidence of pelvic ring fracture. No evidence of femoral fracture.   Labs: Lab Results  Component Value Date   REPTSTATUS 08/09/2016 FINAL 08/06/2016   GRAMSTAIN  12/24/2013    NO WBC SEEN NO SQUAMOUS EPITHELIAL CELLS SEEN NO ORGANISMS SEEN Performed at [facility] >=100,000 COLONIES/mL ESCHERICHIA COLI (A) 08/06/2016   LABORGA ESCHERICHIA COLI (A) 08/06/2016    Orders:  Orders Placed This Encounter  Procedures  . XR Pelvis 1-2 Views  . XR FEMUR, MIN 2 VIEWS RIGHT  . XR FEMUR MIN 2 VIEWS LEFT   Meds ordered this encounter  Medications  . HYDROcodone-acetaminophen (NORCO) 7.5-325 MG tablet    Sig: Take 1 tablet by mouth every 6 (six) hours as needed for moderate pain.    Dispense:  60 tablet    Refill:  0     Procedures: No procedures performed  Clinical Data: No additional findings.  ROS:  All other systems negative, except as noted in the HPI. Review of Systems  Objective: Vital Signs: Ht 4' (1.219 m)   Wt 60 lb (27.2 kg)   BMI 18.31 kg/m   Specialty Comments:  No specialty comments available.  PMFS History: Patient Active  Problem List   Diagnosis Date Noted  . Pain of left femur 11/23/2016  . Bilateral hip pain 11/23/2016  . Thrombocytosis ([place]) 08/06/2016  . Closed fracture of right femur ([place])   . Asthma 12/24/2015  . Closed right hip fracture ([place]) 12/23/2015  . 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 Acute bronchitis 04/26/2015  . DNR (do not resuscitate) 04/28/2014  . Sepsis ([place]) 04/26/2014  . UTI (urinary tract infection) 04/26/2014  . Cellulitis of hip 04/26/2014  . LPRD (laryngopharyngeal reflux disease) 03/09/2014  . Protein-calorie malnutrition, severe ([place]) 12/25/2013  . Malnourished ([place]) 12/25/2013  . Aspiration pneumonia ([place]) 12/24/2013  . Acute and chronic  respiratory failure 12/24/2013  . Broken or cracked tooth, nontraumatic 06/03/2013  . 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 Severe intellectual disabilities 01/02/2013  . 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 Irritability 01/02/2013  . Other and unspecified special symptom or syndrome, not elsewhere classified 01/02/2013  . 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 Presence of cerebrospinal fluid drainage device 01/02/2013  . Unspecified constipation 01/02/2013  . Myoclonus 01/02/2013  . Infantile cerebral palsy ([place]) 10/14/2009  . Mild persistent asthma 10/14/2009   Past Medical History:  Diagnosis Date  . Asthma    breathing treatment  . Bone spur   . Broken femur ([place]) 12/23/15  . Cerebral palsy ([place])   . DNI (do not intubate)   . DNR (do not resuscitate)   . DNR (do not resuscitate) 04/28/2014  . Hip dysplasia    bilateral  . Hydrocephalus   . Mental retardation   . Osteopenia   . Scoliosis   . Seizure disorder ([place])   . UTI (urinary tract infection)     Family History  Problem Relation Age of Onset  . Breast cancer Maternal Grandmother   . Heart disease Maternal Grandfather   . Thyroid disease Maternal Grandfather   . Thyroid disease Maternal Aunt     Past Surgical History:  Procedure Laterality Date  . GASTROSTOMY TUBE PLACEMENT     at same time as nissen  . ILEOPSOAS RELEASE Bilateral    Dr Susy Frizzle  . LAPAROSCOPIC NISSEN FUNDOPLICATION    . SPINAL FUSION  2005  . TENOTOMY ADDUCTOR / HAMSTRING CLOSED Bilateral 04/25/00  . TYMPANOSTOMY TUBE PLACEMENT    . vt shunt placement  1989   At birth   Social History   Occupational History  . disabled Unemployed   Social History Main Topics  . Smoking status: Never Smoker  . Smokeless tobacco: Never Used  . Alcohol use No  . Drug use: No  . Sexual activity: No

## 2016-12-15 ENCOUNTER — Other Ambulatory Visit (INDEPENDENT_AMBULATORY_CARE_PROVIDER_SITE_OTHER): Payer: Self-pay | Admitting: Family

## 2017-01-11 ENCOUNTER — Ambulatory Visit (INDEPENDENT_AMBULATORY_CARE_PROVIDER_SITE_OTHER): Payer: 59 | Admitting: Family

## 2017-01-11 ENCOUNTER — Encounter (INDEPENDENT_AMBULATORY_CARE_PROVIDER_SITE_OTHER): Payer: Self-pay | Admitting: Family

## 2017-01-11 VITALS — BP 90/60 | HR 88 | Temp 97.7°F | Wt <= 1120 oz

## 2017-01-11 DIAGNOSIS — G910 Communicating hydrocephalus: Secondary | ICD-10-CM | POA: Diagnosis not present

## 2017-01-11 DIAGNOSIS — F72 Severe intellectual disabilities: Secondary | ICD-10-CM | POA: Diagnosis not present

## 2017-01-11 DIAGNOSIS — R454 Irritability and anger: Secondary | ICD-10-CM | POA: Diagnosis not present

## 2017-01-11 DIAGNOSIS — G808 Other cerebral palsy: Secondary | ICD-10-CM | POA: Diagnosis not present

## 2017-01-11 DIAGNOSIS — G40319 Generalized idiopathic epilepsy and epileptic syndromes, intractable, without status epilepticus: Secondary | ICD-10-CM | POA: Diagnosis not present

## 2017-01-11 DIAGNOSIS — G253 Myoclonus: Secondary | ICD-10-CM | POA: Diagnosis not present

## 2017-01-11 DIAGNOSIS — M4140 Neuromuscular scoliosis, site unspecified: Secondary | ICD-10-CM

## 2017-01-11 DIAGNOSIS — Q043 Other reduction deformities of brain: Secondary | ICD-10-CM | POA: Diagnosis not present

## 2017-01-11 MED ORDER — TIZANIDINE HCL 2 MG PO TABS: ORAL_TABLET | ORAL | 5 refills | Status: AC

## 2017-01-11 MED ORDER — DIAZEPAM 1 MG/ML PO SOLN: ORAL | 5 refills | Status: AC

## 2017-01-11 MED ORDER — PRIMIDONE 50 MG PO TABS: ORAL_TABLET | ORAL | 5 refills | Status: AC

## 2017-01-11 MED ORDER — BACLOFEN 10 MG PO TABS: ORAL_TABLET | ORAL | 5 refills | Status: AC

## 2017-01-11 NOTE — Progress Notes (Signed)
Patient: Renee [name] MRN: [id number] Sex: female DOB: Oct 21, 1985  Provider: Rockwell Germany, NP Location of Care: [facility]  Note type: Routine return visit  History of Present Illness: Referral Source: Sharilyn Sites, MD  History from: mother and aide, patient and [facility] chart Chief Complaint: Epilepsy  Renee [name] is a 31 y.o. 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 [name] has minor motor seizures that typically occur a few times per week. 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 The patient is medically fragile, wheelchair bound and requires total care. She requires nebulizer treatments and intermittent oxygen at home. She has difficulties managing respiratory secretions and sees a pulmonologist regularly. [name] was last seen July 13, 2016.   Makiyla sometimes has increase in myoclonus, and then an increase in seizures. Mom gives her an occasional extra Mysoline and the behaviors improve.  Mom reports today that Union has had increase in seizures over the last week or so. Mom increased her Primidone slightly to see if that would help and has noted improvement in seizure frequency.   Mom has also noted increase in spasticity with tightness and tremors in her legs, followed by crying and irritability. Mom increased the Baclofen and then the Tizanidine dose slightly and noted improvement in [name]'s condition without any side effects.   Asheley has been otherwise generally healthy since she was last seen. She now receives services from [place] for her private duty nursing instead of [place].  Mom has has no other health concerns for Renee [name] today other than previously mentioned.  Review of Systems: Please see the HPI for neurologic and other pertinent review of systems. Otherwise,  the following systems are noncontributory including constitutional, eyes, ears, nose and throat, cardiovascular, respiratory, gastrointestinal, genitourinary, musculoskeletal, skin, endocrine, hematologic/lymph, allergic/immunologic and psychiatric.   Past Medical History:  Diagnosis Date  . Asthma    breathing treatment  . Bone spur   . Broken femur ([place]) 12/23/15  . Cerebral palsy ([place])   . DNI (do not intubate)   . DNR (do not resuscitate)   . DNR (do not resuscitate) 04/28/2014  . Hip dysplasia    bilateral  . Hydrocephalus   . Mental retardation   . Osteopenia   . Scoliosis   . Seizure disorder ([place])   . UTI (urinary tract infection)    Hospitalizations: No., Head Injury: No., Nervous System Infections: No., Immunizations up to date: Yes.   Past Medical History Comments: See history  Surgical History Past Surgical History:  Procedure Laterality Date  . GASTROSTOMY TUBE PLACEMENT     at same time as nissen  . ILEOPSOAS RELEASE Bilateral    Dr Susy Frizzle  . LAPAROSCOPIC NISSEN FUNDOPLICATION    . SPINAL FUSION  2005  . TENOTOMY ADDUCTOR / HAMSTRING CLOSED Bilateral 04/25/00  . TYMPANOSTOMY TUBE PLACEMENT    . vt shunt placement  1989   At birth    Family History family history includes Breast cancer in her maternal grandmother; Heart disease in her maternal grandfather; Thyroid disease in her maternal aunt and maternal grandfather. Family History is otherwise negative for migraines, seizures, cognitive impairment, blindness, deafness, birth defects, chromosomal disorder, autism.  Social History Social History   Social History  . Marital status: Single    Spouse name: N/A  . Number of children: 0  . Years  of education: N/A   Occupational History  . disabled Unemployed   Social History Main Topics  . Smoking status: Never Smoker  . Smokeless tobacco: Never Used  . Alcohol use No  . Drug use: No  . Sexual activity: No   Other Topics Concern  . None   Social  History Narrative   [name] requires total care in all aspects of daily living. She lives with her mother, who receives some help from a home health aide for [place]. Novalie enjoys watching TV.    Allergies Allergies  Allergen Reactions  . Other     Paper and adhesive tape - blisters  . Penicillins Rash    Has patient had a PCN reaction causing immediate rash, facial/tongue/throat swelling, SOB or lightheadedness with hypotension: NO Has patient had a PCN reaction causing severe rash involving mucus membranes or skin necrosis: NO Has patient had a PCN reaction that required hospitalization: NO Has patient had a PCN reaction occurring within the last 10 years: NO If all of the above answers are "NO", then may proceed with Cephalosporin use.   . Latex Rash and Other (See Comments)    blisters    Physical Exam BP 90/60   Pulse 88   Temp 97.7 F (36.5 C) Comment: Axillary  Wt 56 lb (25.4 kg)   SpO2 90% Comment: on room air  BMI 17.09 kg/m  General: thin female with increased tone, contractures and wasting of her arms and legs, lying on the exam table  Head: oxycephaly, cranial encephalocele, mid-face hypoplasia  Neck: supple with no carotid or supraclavicular bruits.  Respiratory: lungs clear to auscultation  Cardiovascular: regular rate and rhythm, no murmurs  Musculoskeletal: increased tone, fixed contractures and atrophy of her limbs, with some sparing of the right arm.  Skin: She has callouses on her some of her fingers from chewing on them. She has mild redness on her bony prominences but no areas of broken skin.  Neurologic Exam  Mental Status: She has severe mental retardation. She has no language. 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 She was crying intermittently during the visit today. Cranial Nerves: She has dysconjugate roving eye movements. I could not illicit a red reflex. She occasionally turned to localize sounds but I could not get her to do so consistently.  She has lower facial weakness with drooling. She does not protrude the tongue but it appears to be midline.  Motor: Increased tone and rigidity in all extremities. There is some sparing of the right arm, which can be almost fully extended. She has fixed contractures and atrophy in the left arm and both lower extremities. She has tight heel cords. She occasionally rubs her mouth and her head with her right hand.  Sensory: Withdrawal x 4.  Coordination: Unable to follow instructions to assess coordination  Gait and Station: Wheelchair bound - unable to stand or bear weight Reflexes: 1+ in the right upper extremity and absent in the other extremities. I could not illicit clonus.  Impression 1. 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 2. Minor motor seizures 3. Significant intellectual delay 4. Spastic quadriparesis 5. Severe dysphagia 6. Decreased visual acuity 7. Agitation and irritability  Recommendations for plan of care The patient's previous [facility] records were reviewed. Yaretsi has neither had nor required imaging or lab studies since the last visit. She is a 31 year old young woman with history of semilobar holoprosencephaly with  a dorsal third ventricle cyst, agenesis of the corpus callosum, and cranial encephalocele. 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 Abbye has episodes of myoclonus as well as minor motor seizures that have recently increased in frequency. We will increase the Primidone dose slightly but if seizures continue to increase, I asked Mom to call and let me know. Alaze has also had increase in spasms in her legs and I also increased the Baclofen and Tizanidine doses. I asked Mom to call me if this does not help to make Navaeh more comfortable.  I asked Mom to call if she has questions or concerns.  Mom is a strong advocate for [place] and is devoted to her care. I will see Seirra back in follow up in November or sooner if needed  The medication list was reviewed and reconciled.  I reviewed changes that were made in the prescribed medications today.  A complete medication list was provided to the patient's mother.  Allergies as of 01/11/2017      Reactions   Other    Paper and adhesive tape - blisters   Penicillins Rash   Has patient had a PCN reaction causing immediate rash, facial/tongue/throat swelling, SOB or lightheadedness with hypotension: NO Has patient had a PCN reaction causing severe rash involving mucus membranes or skin necrosis: NO Has patient had a PCN reaction that required hospitalization: NO Has patient had a PCN reaction occurring within the last 10 years: NO If all of the above answers are "NO", then may proceed with Cephalosporin use.   Latex Rash, Other (See Comments)   blisters      Medication List       Accurate as of 01/11/17  1:27 PM. Always use your most recent med list.          albuterol (2.5 MG/3ML) 0.083% nebulizer solution Commonly known as:  PROVENTIL Take 3 mLs (2.5 mg total) by nebulization every 6 (six) hours as needed for wheezing or shortness of breath.   ALPRAZolam 0.25 MG tablet Commonly known as:  XANAX Give 1/4 to 1/2 tablet up to twice per day as needed for severe irritability   arformoterol 15 MCG/2ML Nebu Commonly known as:  BROVANA Take 2 mLs (15 mcg total) by nebulization 2 (two) times daily.   baclofen 10 MG tablet Commonly known as:  LIORESAL Give 2 tablets at 7AM, 11AM, 3PM and 10PM. May give additional 1 tablet at [facility] if needed   diazepam 1 MG/ML solution Commonly known as:  VALIUM GIVE 3ML EVERY 8 HOURS AS NEEDED FOR AGITATION.   feeding supplement (JEVITY 1.5 CAL/FIBER) Liqd Place 237 mLs into feeding tube 4 (four) times daily. 1 can at 0700, 1100, 1500, and 2000.   HYDROcodone-acetaminophen 7.5-325 MG tablet Commonly  known as:  NORCO Take 1 tablet by mouth every 6 (six) hours as needed for moderate pain.   medroxyPROGESTERone 150 MG/ML injection Commonly known as:  DEPO-PROVERA INJECT 1ML INTO THE MUSCLE EVERY 3 MONTHS.   polyethylene glycol powder powder Commonly known as:  MIRALAX Take 1 capful every day. Titrate as needed. - via peg tube -  as needed for constipation   primidone 50 MG tablet Commonly known as:  MYSOLINE TAKE 5 TABLETS EVERY MORNING,4 TABLETS IN THE AFTERNOON, AND 5 TABLETS IN THE EVENING.   PULMICORT 0.25 MG/2ML nebulizer solution Generic drug:  budesonide USE 1 VIAL PER NEBULIZER TWICE DAILY.  ranitidine 150 MG/10ML syrup Commonly known as:  ZANTAC Place 10 mLs (150 mg total) into feeding tube 2 (two) times daily.   [name] 0.5 MG tablet Commonly known as:  RISPERDAL TAKE 3 TABS VIA G TUBE AT 7AM, 3 TABS AT 11AM, 3 TABS AT 3PM AND 1 TAB AT BEDTIME.   tiZANidine 2 MG tablet Commonly known as:  ZANAFLEX Crush and give via G-tube - 2 tablets at 7AM, 1+1/2 tablets at 11AM, 1 tablet at 3PM, 2 tablets at 6pm and 1+1/2 tablets at 10PM.       Total time spent with the patient was 30 minutes, of which 50% or more was spent in counseling and coordination of care.   Rockwell Germany NP-C

## 2017-01-11 NOTE — Patient Instructions (Signed)
We will increase the Primidone 50mg  to 5 tablets in the morning, 4 tablets at 3pm and 5 tablets at 10pm. If the seizures continue, let me know. We will need to check a Primidone level and/or consider another treatment option for Renee [name].   We will increase the Baclofen dose to 2 tablets at 7AM, 11AM, 3PM, and 10PM. You may give an additional 1 tablet at [facility] if needed for spasms.   We will increase the Tizanidine 2mg  to 2 tablets at 7AM, 1+1/2 tablets at 11AM, 1 tablet at 3PM, 2 tablets at 6PM, and 1+1/2 tablets at 10PM.   Let me know if Renee [name] continues to have increase in spasms or if she continues to act as if she is uncomfortable.   Please return for follow up in November or sooner if needed.

## 2017-01-14 ENCOUNTER — Ambulatory Visit (INDEPENDENT_AMBULATORY_CARE_PROVIDER_SITE_OTHER): Payer: 59

## 2017-01-14 DIAGNOSIS — Z3042 Encounter for surveillance of injectable contraceptive: Secondary | ICD-10-CM

## 2017-01-14 MED ORDER — MEDROXYPROGESTERONE ACETATE 150 MG/ML IM SUSP
150.0000 mg | Freq: Once | INTRAMUSCULAR | Status: AC
  Administered 2017-01-14: 150 mg via INTRAMUSCULAR

## 2017-01-14 NOTE — Progress Notes (Signed)
PT here for depo shot 150 mg IM given LT anterior thigh. Tolerated well. Return 12 weeks for next shot.pad CMA

## 2017-01-17 ENCOUNTER — Other Ambulatory Visit (INDEPENDENT_AMBULATORY_CARE_PROVIDER_SITE_OTHER): Payer: Self-pay | Admitting: Family

## 2017-01-17 DIAGNOSIS — R454 Irritability and anger: Secondary | ICD-10-CM

## 2017-01-17 DIAGNOSIS — F72 Severe intellectual disabilities: Secondary | ICD-10-CM

## 2017-03-15 ENCOUNTER — Other Ambulatory Visit: Payer: Self-pay | Admitting: Pulmonary Disease

## 2017-03-15 NOTE — Telephone Encounter (Signed)
Refill request of Proventil (2.5 mg/55ml) 0.083% nebulizer sol. Pt last seen at office 04/05/16. Med last refilled 09/09/15.   Dr. Halford Chessman, please advise if it is okay for [place] to refill this med.  Thanks!

## 2017-03-19 ENCOUNTER — Other Ambulatory Visit (INDEPENDENT_AMBULATORY_CARE_PROVIDER_SITE_OTHER): Payer: Self-pay | Admitting: Family

## 2017-03-19 DIAGNOSIS — R454 Irritability and anger: Secondary | ICD-10-CM

## 2017-03-19 DIAGNOSIS — F72 Severe intellectual disabilities: Secondary | ICD-10-CM

## 2017-04-04 ENCOUNTER — Other Ambulatory Visit: Payer: Self-pay | Admitting: Adult Health

## 2017-04-08 ENCOUNTER — Ambulatory Visit (INDEPENDENT_AMBULATORY_CARE_PROVIDER_SITE_OTHER): Payer: 59 | Admitting: *Deleted

## 2017-04-08 ENCOUNTER — Other Ambulatory Visit: Payer: Self-pay | Admitting: Adult Health

## 2017-04-08 ENCOUNTER — Encounter: Payer: Self-pay | Admitting: *Deleted

## 2017-04-08 DIAGNOSIS — Z3042 Encounter for surveillance of injectable contraceptive: Secondary | ICD-10-CM

## 2017-04-08 MED ORDER — MEDROXYPROGESTERONE ACETATE 150 MG/ML IM SUSP
150.0000 mg | Freq: Once | INTRAMUSCULAR | Status: AC
  Administered 2017-04-08: 150 mg via INTRAMUSCULAR

## 2017-04-08 NOTE — Progress Notes (Signed)
Depo Provera 150mg  IM given in left thigh with no complications. Pt to return in 12 weeks for next injection.

## 2017-04-09 ENCOUNTER — Telehealth: Payer: Self-pay | Admitting: Pulmonary Disease

## 2017-04-09 MED ORDER — BUDESONIDE 0.25 MG/2ML IN SUSP: 0.2500 mg | Freq: Two times a day (BID) | RESPIRATORY_TRACT | 0 refills | Status: AC

## 2017-04-09 NOTE — Telephone Encounter (Signed)
Called and spoke with pt. Pt is requesting Rx for Pulmicort. Pt last seen 04/05/17 with VS, pt has pending apt with TP on 04/16/17. Rx has been sent to preferred pharmacy. Nothing further needed.

## 2017-04-16 ENCOUNTER — Encounter: Payer: Self-pay | Admitting: Adult Health

## 2017-04-16 ENCOUNTER — Ambulatory Visit (INDEPENDENT_AMBULATORY_CARE_PROVIDER_SITE_OTHER): Payer: 59 | Admitting: Adult Health

## 2017-04-16 DIAGNOSIS — J4530 Mild persistent asthma, uncomplicated: Secondary | ICD-10-CM | POA: Diagnosis not present

## 2017-04-16 DIAGNOSIS — J690 Pneumonitis due to inhalation of food and vomit: Secondary | ICD-10-CM

## 2017-04-16 DIAGNOSIS — Z23 Encounter for immunization: Secondary | ICD-10-CM

## 2017-04-16 NOTE — Assessment & Plan Note (Signed)
Doing well w/ no recent PNA  Cont w/ aspiration precautions

## 2017-04-16 NOTE — Progress Notes (Signed)
@Patient  ID: Ottis Stain, female    DOB: Mar 13, 1986, 31 y.o.   MRN: [id number]  Chief Complaint  Patient presents with  . Follow-up    Asthma     Referring provider: Sharilyn Sites, MD  HPI: Bela Bonaparte is a 31 y.o. female with asthma and recurrent aspiration pneumonia in setting of cerebral palsy with quadriplegia.  She is DNR/DNI.  04/16/2017 Follow up : Asthma  Pt presents for follow up with mother and sitter.  Says she has been doing well with no episodes of PNA . Hx of recurrent aspiration pneumonia . She is currently on brovana and pulmicort Twice daily  . Last seen in office 1 year ago . No recent abx.  Still has lots mucus . Uses albuterol As needed  .    Wants flu shot today.    Allergies  Allergen Reactions  . Other     Paper and adhesive tape - blisters  . Penicillins Rash    Has patient had a PCN reaction causing immediate rash, facial/tongue/throat swelling, SOB or lightheadedness with hypotension: NO Has patient had a PCN reaction causing severe rash involving mucus membranes or skin necrosis: NO Has patient had a PCN reaction that required hospitalization: NO Has patient had a PCN reaction occurring within the last 10 years: NO If all of the above answers are "NO", then may proceed with Cephalosporin use.   . Latex Rash and Other (See Comments)    blisters    Immunization History  Administered Date(s) Administered  . Influenza Split 04/11/2011, 04/14/2012, 04/22/2016  . Influenza Whole 04/22/2009, 04/22/2010  . Influenza,inj,Quad PF,6+ Mos 05/01/2013, 04/30/2014, 05/10/2015  . Pneumococcal Polysaccharide-23 07/24/2007, 05/01/2013    Past Medical History:  Diagnosis Date  . Asthma    breathing treatment  . Bone spur   . Broken femur ([place]) 12/23/15  . Cerebral palsy ([place])   . DNI (do not intubate)   . DNR (do not resuscitate)   . DNR (do not resuscitate) 04/28/2014  . Hip dysplasia    bilateral  . Hydrocephalus   . Mental retardation   . Osteopenia   .  Scoliosis   . Seizure disorder ([place])   . UTI (urinary tract infection)     Tobacco History: History  Smoking Status  . Never Smoker  Smokeless Tobacco  . Never Used   Counseling given: Not Answered   Outpatient Encounter Prescriptions as of 04/16/2017  Medication Sig  . albuterol (PROVENTIL) (2.5 MG/3ML) 0.083% nebulizer solution USE 1 VIAL IN NEBULIZER EVERY 6 HOURS FOR WHEEZING OR SHORTNESS OF BREATH.  Marland Kitchen ALPRAZolam (XANAX) 0.25 MG tablet Give 1/4 to 1/2 tablet up to twice per day as needed for severe irritability  . arformoterol (BROVANA) 15 MCG/2ML NEBU Take 2 mLs (15 mcg total) by nebulization 2 (two) times daily.  . baclofen (LIORESAL) 10 MG tablet Give 2 tablets at 7AM, 11AM, 3PM and 10PM. May give additional 1 tablet at [facility] if needed  . budesonide (PULMICORT) 0.25 MG/2ML nebulizer solution Take 2 mLs (0.25 mg total) by nebulization 2 (two) times daily.  . diazepam (VALIUM) 1 MG/ML solution GIVE 3ML EVERY 8 HOURS AS NEEDED FOR AGITATION.  Marland Kitchen HYDROcodone-acetaminophen (NORCO) 7.5-325 MG tablet Take 1 tablet by mouth every 6 (six) hours as needed for moderate pain.  . medroxyPROGESTERone (DEPO-PROVERA) 150 MG/ML injection INJECT 1ML INTO THE MUSCLE EVERY 3 MONTHS.  . Nutritional Supplements (FEEDING SUPPLEMENT, JEVITY 1.5 CAL/FIBER,) LIQD Place 237 mLs into feeding tube 4 (four) times daily.  1 can at 0700, 1100, 1500, and 2000.  Marland Kitchen polyethylene glycol powder (MIRALAX) powder Take 1 capful every day. Titrate as needed. - via peg tube -  as needed for constipation  . primidone (MYSOLINE) 50 MG tablet TAKE 5 TABLETS EVERY MORNING,4 TABLETS IN THE AFTERNOON, AND 5 TABLETS IN THE EVENING.  . ranitidine (ZANTAC) 150 MG/10ML syrup Place 10 mLs (150 mg total) into feeding tube 2 (two) times daily.  . [name] (RISPERDAL) 0.5 MG tablet TAKE 3 TABS VIA G TUBE AT 7AM, 3 TABS AT 11AM, 3 TABS AT 3PM AND 1 TAB AT BEDTIME.  Marland Kitchen tiZANidine (ZANAFLEX) 2 MG tablet Crush and give via G-tube - 2 tablets  at 7AM, 1+1/2 tablets at 11AM, 1 tablet at 3PM, 2 tablets at 6pm and 1+1/2 tablets at 10PM.   No facility-administered encounter medications on file as of 04/16/2017.      Review of Systems  Reviewed with mom , as above.    Physical Exam  BP 124/86 (BP Location: Right Arm, Cuff Size: Small)   Pulse 87   SpO2 91%   GEN:  Very frail and in wc .    HEENT:  Dry mucosa   NECK:   ; no lymphadenopathy.    RESP  Few rhonchi   CARD:  RRR, no m/r/g, no peripheral edema,    GI:   Soft   nml bowel sounds , FT   Musco: contracted , muscle wasting   Neuro: alert, moans   Skin: Warm, no lesions or rashes    Lab Results:   BNP No results found for: BNP  Imaging: No results found.   Assessment & Plan:   Mild persistent asthma Controlled on current regimen  Cont with pulmonary hygiene   Plan  Patient Instructions  Continue with chest PT  Continue on Pulmicort Neb and Brovana Neb  Twice daily   Continue on Albuterol Neb as needed.  Follow up Dr. Halford Chessman  1 year and As needed   Flu shot today     Aspiration pneumonia [facility]) Doing well w/ no recent PNA  Cont w/ aspiration precautions      Rexene Edison, NP 04/16/2017

## 2017-04-16 NOTE — Patient Instructions (Signed)
Continue with chest PT  Continue on Pulmicort Neb and Brovana Neb  Twice daily   Continue on Albuterol Neb as needed.  Follow up Dr. Halford Chessman  1 year and As needed   Flu shot today

## 2017-04-16 NOTE — Assessment & Plan Note (Signed)
Controlled on current regimen  Cont with pulmonary hygiene   Plan  Patient Instructions  Continue with chest PT  Continue on Pulmicort Neb and Brovana Neb  Twice daily   Continue on Albuterol Neb as needed.  Follow up Dr. Halford Chessman  1 year and As needed   Flu shot today

## 2017-04-16 NOTE — Progress Notes (Signed)
I have reviewed and agree with assessment/plan.  [name], MD [facility] Pulmonary/Critical Care 04/16/2017, 12:00 PM Pager:  [phone]

## 2017-04-30 ENCOUNTER — Encounter: Payer: Self-pay | Admitting: Gastroenterology

## 2017-04-30 ENCOUNTER — Ambulatory Visit (INDEPENDENT_AMBULATORY_CARE_PROVIDER_SITE_OTHER): Payer: 59 | Admitting: Gastroenterology

## 2017-04-30 DIAGNOSIS — K219 Gastro-esophageal reflux disease without esophagitis: Secondary | ICD-10-CM

## 2017-04-30 DIAGNOSIS — K3184 Gastroparesis: Secondary | ICD-10-CM

## 2017-04-30 DIAGNOSIS — K5900 Constipation, unspecified: Secondary | ICD-10-CM

## 2017-04-30 DIAGNOSIS — R634 Abnormal weight loss: Secondary | ICD-10-CM

## 2017-04-30 DIAGNOSIS — G809 Cerebral palsy, unspecified: Secondary | ICD-10-CM | POA: Diagnosis not present

## 2017-04-30 MED ORDER — RANITIDINE HCL 150 MG/10ML PO SYRP: 150.0000 mg | ORAL_SOLUTION | Freq: Two times a day (BID) | ORAL | 5 refills | Status: AC

## 2017-04-30 MED ORDER — POLYETHYLENE GLYCOL 3350 17 GM/SCOOP PO POWD: ORAL | 10 refills | Status: AC

## 2017-04-30 MED ORDER — METOCLOPRAMIDE HCL 5 MG/5ML PO SOLN: ORAL | 1 refills | Status: AC

## 2017-04-30 NOTE — Progress Notes (Signed)
HPI :  31 y/o female with history of severe cerebral palsy and scoliosis, permanent feeding tube in place. She has a history of aspiration pneumonias in the past. She has had a prior gastric emptying study showing gastroparesis in 2015. She has also had chronic constipation taking miralax.   She is brought here today by her mother and the nurse who helps provide care for her at home. Mother is concerned about weight loss - about 4-5 pounds over the past 6 months. She thinks this is due to reduced capacity for tube feeds. She normally has about 5 cans of Jevity per day, ver the past 6 months she is down to about 3-3/2 cans per day. Mother thinks she doesn't tolerate the volume as well as she used to - she can grab her stomach if she has too much volume. She has not had any vomiting. She has some episodes of gas, and has some baseline asthma / coughing but no aspiration events. Tube is functioning okay, no leakage. She is taking zantac twice daily. She is not using narcotic much, only rarely. She is moving her bowels okay, using miralax with regular BMs.  CT scan 08/06/2016 - pneumonia, no bowel abnormalities   Past Medical History:  Diagnosis Date  . Asthma    breathing treatment  . Bone spur   . Broken femur ([place]) 12/23/15  . Cerebral palsy ([place])   . DNI (do not intubate)   . DNR (do not resuscitate)   . DNR (do not resuscitate) 04/28/2014  . Hip dysplasia    bilateral  . Hydrocephalus   . Mental retardation   . Osteopenia   . Scoliosis   . Seizure disorder ([place])   . UTI (urinary tract infection)      Past Surgical History:  Procedure Laterality Date  . GASTROSTOMY TUBE PLACEMENT     at same time as nissen  . ILEOPSOAS RELEASE Bilateral    Dr Susy Frizzle  . LAPAROSCOPIC NISSEN FUNDOPLICATION    . SPINAL FUSION  2005  . TENOTOMY ADDUCTOR / HAMSTRING CLOSED Bilateral 04/25/00  . TYMPANOSTOMY TUBE PLACEMENT    . vt shunt placement  1989   At birth   Family History  Problem Relation  Age of Onset  . Breast cancer Maternal Grandmother   . Heart disease Maternal Grandfather   . Thyroid disease Maternal Grandfather   . Thyroid disease Maternal Aunt    Social History  Substance Use Topics  . Smoking status: Never Smoker  . Smokeless tobacco: Never Used  . Alcohol use No   Current Outpatient Prescriptions  Medication Sig Dispense Refill  . albuterol (PROVENTIL) (2.5 MG/3ML) 0.083% nebulizer solution USE 1 VIAL IN NEBULIZER EVERY 6 HOURS FOR WHEEZING OR SHORTNESS OF BREATH. 300 mL 0  . ALPRAZolam (XANAX) 0.25 MG tablet Give 1/4 to 1/2 tablet up to twice per day as needed for severe irritability 30 tablet 1  . arformoterol (BROVANA) 15 MCG/2ML NEBU Take 2 mLs (15 mcg total) by nebulization 2 (two) times daily. 120 mL 5  . baclofen (LIORESAL) 10 MG tablet Give 2 tablets at 7AM, 11AM, 3PM and 10PM. May give additional 1 tablet at [facility] if needed 279 tablet 5  . budesonide (PULMICORT) 0.25 MG/2ML nebulizer solution Take 2 mLs (0.25 mg total) by nebulization 2 (two) times daily. 120 mL 0  . diazepam (VALIUM) 1 MG/ML solution GIVE 3ML EVERY 8 HOURS AS NEEDED FOR AGITATION. 90 mL 5  . HYDROcodone-acetaminophen (NORCO) 7.5-325 MG tablet  Take 1 tablet by mouth every 6 (six) hours as needed for moderate pain. 60 tablet 0  . medroxyPROGESTERone (DEPO-PROVERA) 150 MG/ML injection INJECT 1ML INTO THE MUSCLE EVERY 3 MONTHS. 1 mL 3  . Nutritional Supplements (FEEDING SUPPLEMENT, JEVITY 1.5 CAL/FIBER,) LIQD Place 237 mLs into feeding tube 4 (four) times daily. 1 can at 0700, 1100, 1500, and 2000.    Marland Kitchen polyethylene glycol powder (MIRALAX) powder Take 1 capful every day. Titrate as needed. - via peg tube -  as needed for constipation 500 g 10  . primidone (MYSOLINE) 50 MG tablet TAKE 5 TABLETS EVERY MORNING,4 TABLETS IN THE AFTERNOON, AND 5 TABLETS IN THE EVENING. 434 tablet 5  . ranitidine (ZANTAC) 150 MG/10ML syrup Place 10 mLs (150 mg total) into feeding tube 2 (two) times daily. 600 mL 5  .  [name] (RISPERDAL) 0.5 MG tablet TAKE 3 TABS VIA G TUBE AT 7AM, 3 TABS AT 11AM, 3 TABS AT 3PM AND 1 TAB AT BEDTIME. 300 tablet 2  . tiZANidine (ZANAFLEX) 2 MG tablet Crush and give via G-tube - 2 tablets at 7AM, 1+1/2 tablets at 11AM, 1 tablet at 3PM, 2 tablets at 6pm and 1+1/2 tablets at 10PM. 248 tablet 5  . metoCLOPramide (REGLAN) 5 MG/5ML solution Use 2.5mg  prior to meals.  Can increase to 5mg  as needed 225 mL 1   No current facility-administered medications for this visit.    Allergies  Allergen Reactions  . Other     Paper and adhesive tape - blisters  . Penicillins Rash    Has patient had a PCN reaction causing immediate rash, facial/tongue/throat swelling, SOB or lightheadedness with hypotension: NO Has patient had a PCN reaction causing severe rash involving mucus membranes or skin necrosis: NO Has patient had a PCN reaction that required hospitalization: NO Has patient had a PCN reaction occurring within the last 10 years: NO If all of the above answers are "NO", then may proceed with Cephalosporin use.   . Latex Rash and Other (See Comments)    blisters     Review of Systems: All systems reviewed and negative except where noted in HPI.    Physical Exam: There were no vitals taken for this visit. Patient has extremely small / angulated arms / legs Constitutional: female in wheelchair, unable to communicate, no acute distress. Cardiovascular: Normal rate, regular rhythm.  Pulmonary/chest: Effort normal and breath sounds normal.  Abdominal: Soft, scaphoid abdomen. Button PEG in place without drainage or purulence. Nondistended, nontender.  Extremities: deformed, thin extremities.   ASSESSMENT AND PLAN: 31 year old female history of cerebral palsy dependent on tube feeds, history of gastroparesis, presenting with a small amount of weight loss over the past 6 months likely due to intolerance of normal volume of tube feeds.  Discussed options with the patient's mother  and nurse. We have discussed potentially trying Reglan in the past to promote motility discussed this again with them in regards to potential risks benefits. They're weary of neurologic side effects however this is very rare especially at low doses (30mg /day). We'll try a very low-dose Reglan, 2.5mg  TID prior to meals, to see if this helps. We may consider increasing to 5mg  TID if tolerated. If this doesn't help we may consider changing tube feeds to higher caloric content with lower volume. If she has any symptoms of aspiration or vomiting tube feeds, may consider a barium study to evaluate the gastric outlet. Otherwise continue MiraLAX and Zantac at this time. I asked the patient's mother to  call me in a few weeks with an update. She agreed with the plan.   [place] Cellar, MD [facility] Pager [phone]

## 2017-04-30 NOTE — Patient Instructions (Signed)
We have sent the following medications to your pharmacy for you to pick up at your convenience:  Reglan Miralax Zantac    Thank you

## 2017-05-08 ENCOUNTER — Telehealth (INDEPENDENT_AMBULATORY_CARE_PROVIDER_SITE_OTHER): Payer: Self-pay | Admitting: Family

## 2017-05-09 NOTE — Telephone Encounter (Signed)
2 page fax received from Lorre Nick, RN, [place] at [facility], requesting Dr. Gaynell Face to sign orders and return.  Fax: ATTNJonelle Sidle @ [place]           (F) [phone]   Fax has been labeled and placed in Dr. Melanee Left office in his tray.

## 2017-05-10 NOTE — Telephone Encounter (Signed)
Signed and returned

## 2017-05-23 ENCOUNTER — Emergency Department (HOSPITAL_COMMUNITY): Payer: 59

## 2017-05-23 ENCOUNTER — Encounter (HOSPITAL_COMMUNITY): Payer: Self-pay | Admitting: Emergency Medicine

## 2017-05-23 ENCOUNTER — Emergency Department (HOSPITAL_COMMUNITY)
Admission: EM | Admit: 2017-05-23 | Discharge: 2017-05-23 | Disposition: A | Discharge status: Home / Self Care | Payer: 59 | Source: Home / Self Care | Attending: Emergency Medicine | Admitting: Emergency Medicine

## 2017-05-23 DIAGNOSIS — Z982 Presence of cerebrospinal fluid drainage device: Secondary | ICD-10-CM

## 2017-05-23 DIAGNOSIS — F72 Severe intellectual disabilities: Secondary | ICD-10-CM | POA: Insufficient documentation

## 2017-05-23 DIAGNOSIS — R042 Hemoptysis: Secondary | ICD-10-CM

## 2017-05-23 DIAGNOSIS — G809 Cerebral palsy, unspecified: Secondary | ICD-10-CM

## 2017-05-23 DIAGNOSIS — Z79899 Other long term (current) drug therapy: Secondary | ICD-10-CM | POA: Insufficient documentation

## 2017-05-23 DIAGNOSIS — J188 Other pneumonia, unspecified organism: Secondary | ICD-10-CM | POA: Insufficient documentation

## 2017-05-23 DIAGNOSIS — J189 Pneumonia, unspecified organism: Secondary | ICD-10-CM

## 2017-05-23 DIAGNOSIS — J45909 Unspecified asthma, uncomplicated: Secondary | ICD-10-CM | POA: Insufficient documentation

## 2017-05-23 DIAGNOSIS — A419 Sepsis, unspecified organism: Secondary | ICD-10-CM | POA: Diagnosis not present

## 2017-05-23 LAB — CBC WITH DIFFERENTIAL/PLATELET
BASOS PCT: 1 %
Basophils Absolute: 0 10*3/uL (ref 0.0–0.1)
EOS ABS: 0.4 10*3/uL (ref 0.0–0.7)
EOS PCT: 5 %
HCT: 46.4 % — ABNORMAL HIGH (ref 36.0–46.0)
HEMOGLOBIN: 15 g/dL (ref 12.0–15.0)
LYMPHS ABS: 1.6 10*3/uL (ref 0.7–4.0)
Lymphocytes Relative: 20 %
MCH: 33.6 pg (ref 26.0–34.0)
MCHC: 32.3 g/dL (ref 30.0–36.0)
MCV: 104 fL — ABNORMAL HIGH (ref 78.0–100.0)
MONOS PCT: 7 %
Monocytes Absolute: 0.6 10*3/uL (ref 0.1–1.0)
NEUTROS PCT: 67 %
Neutro Abs: 5.5 10*3/uL (ref 1.7–7.7)
PLATELETS: 265 10*3/uL (ref 150–400)
RBC: 4.46 MIL/uL (ref 3.87–5.11)
RDW: 14.1 % (ref 11.5–15.5)
WBC: 8.1 10*3/uL (ref 4.0–10.5)

## 2017-05-23 LAB — BASIC METABOLIC PANEL
Anion gap: 12 (ref 5–15)
BUN: 6 mg/dL (ref 6–20)
CALCIUM: 9.2 mg/dL (ref 8.9–10.3)
CHLORIDE: 96 mmol/L — AB (ref 101–111)
CO2: 31 mmol/L (ref 22–32)
Glucose, Bld: 75 mg/dL (ref 65–99)
Potassium: 3.9 mmol/L (ref 3.5–5.1)
Sodium: 139 mmol/L (ref 135–145)

## 2017-05-23 LAB — D-DIMER, QUANTITATIVE (NOT AT ARMC): D DIMER QUANT: 0.39 ug{FEU}/mL (ref 0.00–0.50)

## 2017-05-23 MED ORDER — DEXTROSE 5 % IV SOLN
1.0000 g | Freq: Once | INTRAVENOUS | Status: AC
  Administered 2017-05-23: 1 g via INTRAVENOUS
  Filled 2017-05-23: qty 10

## 2017-05-23 MED ORDER — ALBUTEROL SULFATE (2.5 MG/3ML) 0.083% IN NEBU
5.0000 mg | INHALATION_SOLUTION | Freq: Once | RESPIRATORY_TRACT | Status: AC
  Administered 2017-05-23: 5 mg via RESPIRATORY_TRACT

## 2017-05-23 MED ORDER — ALBUTEROL SULFATE (2.5 MG/3ML) 0.083% IN NEBU
INHALATION_SOLUTION | RESPIRATORY_TRACT | Status: AC
  Administered 2017-05-23: 5 mg via RESPIRATORY_TRACT
  Filled 2017-05-23: qty 6

## 2017-05-23 MED ORDER — DEXTROSE 5 % IV SOLN
500.0000 mg | Freq: Once | INTRAVENOUS | Status: AC
  Administered 2017-05-23: 500 mg via INTRAVENOUS
  Filled 2017-05-23: qty 500

## 2017-05-23 MED ORDER — AZITHROMYCIN 200 MG/5ML PO SUSR: 10.0000 mg/kg | Freq: Every day | ORAL | 0 refills | Status: AC

## 2017-05-23 NOTE — ED Notes (Signed)
Pt's 02 sat 86% on room air.  Placed pt on 02 at 4liters and EDP ordered breathing treatment.  Respiratory therapy notified.  Placed pt on 02 at 4 liters.  02 sat 96%.

## 2017-05-23 NOTE — Discharge Instructions (Signed)
Use oxygen as needed to keep Renee [name]'s pulse oximetry 92% or higher.  Use the albuterol nebulizer every 4 hours as needed for shortness of breath.  Return if needed more than every 4 hours.  You can start to give the antibiotic tomorrow as she has had today's dose here.  Return if concern for any reason

## 2017-05-23 NOTE — ED Provider Notes (Signed)
[facility] EMERGENCY DEPARTMENT Provider Note   CSN: [id number] Arrival date & time: 05/23/17  4268   Level 5 caveat patient noncommunicative.  History is obtained from patient's mother  History   Chief Complaint Chief Complaint  Patient presents with  . Hematemesis  Chief complaint hemoptysis, not hematemesis patient has chronic cough.  Chronically coughs up mucus.  Today she coughed up mucus mixed with blood twice.  No vomiting.  No fever.  Patient looks at baseline presently per her mother.  HPI Renee [name] is a 31 y.o. female.  HPI  Past Medical History:  Diagnosis Date  . Asthma    breathing treatment  . Bone spur   . Broken femur ([place]) 12/23/15  . Cerebral palsy ([place])   . DNI (do not intubate)   . DNR (do not resuscitate)   . DNR (do not resuscitate) 04/28/2014  . Hip dysplasia    bilateral  . Hydrocephalus   . Mental retardation   . Osteopenia   . Scoliosis   . Seizure disorder ([place])   . UTI (urinary tract infection)     Patient Active Problem List   Diagnosis Date Noted  . Pain of left femur 11/23/2016  . Bilateral hip pain 11/23/2016  . Thrombocytosis ([place]) 08/06/2016  . Closed fracture of right femur ([place])   . Asthma 12/24/2015  . Closed right hip fracture ([place]) 12/23/2015  . 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 Acute bronchitis 04/26/2015  . DNR (do not resuscitate) 04/28/2014  . Sepsis ([place]) 04/26/2014  . UTI (urinary tract infection) 04/26/2014  . Cellulitis of hip 04/26/2014  . LPRD (laryngopharyngeal reflux disease) 03/09/2014  . Protein-calorie malnutrition, severe ([place]) 12/25/2013  . Malnourished ([place]) 12/25/2013  . Aspiration pneumonia ([place]) 12/24/2013  . Acute and chronic respiratory failure 12/24/2013  . Broken or cracked tooth, nontraumatic 06/03/2013  . 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 Severe  intellectual disabilities 01/02/2013  . 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 Irritability 01/02/2013  . Other and unspecified special symptom or syndrome, not elsewhere classified 01/02/2013  . 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 Presence of cerebrospinal fluid drainage device 01/02/2013  . Unspecified constipation 01/02/2013  . Myoclonus 01/02/2013  . Infantile cerebral palsy ([place]) 10/14/2009  . Mild persistent asthma 10/14/2009    Past Surgical History:  Procedure Laterality Date  . GASTROSTOMY TUBE PLACEMENT     at same time as nissen  . ILEOPSOAS RELEASE Bilateral    Dr Susy Frizzle  . LAPAROSCOPIC NISSEN FUNDOPLICATION    . SPINAL FUSION  2005  . TENOTOMY ADDUCTOR / HAMSTRING CLOSED Bilateral 04/25/00  . TYMPANOSTOMY TUBE PLACEMENT    . vt shunt placement  1989   At birth    OB History    Gravida Para Term Preterm AB Living   0 0 0 0 0 0   SAB TAB Ectopic Multiple Live Births   0 0 0 0         Home Medications    Prior to Admission medications   Medication Sig Start Date End Date Taking? Authorizing Provider  albuterol (PROVENTIL) (2.5 MG/3ML) 0.083% nebulizer solution USE 1 VIAL IN NEBULIZER EVERY 6 HOURS FOR WHEEZING OR SHORTNESS OF BREATH. 03/15/17   [name], MD  ALPRAZolam Duanne Moron) 0.25 MG tablet Give 1/4 to 1/2 tablet up to twice per day as needed for severe irritability 06/20/15   Rockwell Germany, NP  arformoterol [facility])  15 MCG/2ML NEBU Take 2 mLs (15 mcg total) by nebulization 2 (two) times daily. 09/09/15   [name], MD  baclofen (LIORESAL) 10 MG tablet Give 2 tablets at 7AM, 11AM, 3PM and 10PM. May give additional 1 tablet at [facility] if needed 01/11/17   Rockwell Germany, NP  budesonide (PULMICORT) 0.25 MG/2ML nebulizer solution Take 2 mLs (0.25 mg total) by nebulization 2 (two) times daily. 04/09/17   [name], MD  diazepam (VALIUM) 1 MG/ML solution GIVE 3ML EVERY 8 HOURS AS NEEDED FOR AGITATION. 01/11/17   Rockwell Germany, NP    HYDROcodone-acetaminophen (NORCO) 7.5-325 MG tablet Take 1 tablet by mouth every 6 (six) hours as needed for moderate pain. 11/23/16   Newt Minion, MD  medroxyPROGESTERone (DEPO-PROVERA) 150 MG/ML injection INJECT 1ML INTO THE MUSCLE EVERY 3 MONTHS. 10/22/16   Estill Dooms, NP  metoCLOPramide (REGLAN) 5 MG/5ML solution Use 2.5mg  prior to meals.  Can increase to 5mg  as needed 04/30/17   Armbruster, Carlota Raspberry, MD  Nutritional Supplements (FEEDING SUPPLEMENT, JEVITY 1.5 CAL/FIBER,) LIQD Place 237 mLs into feeding tube 4 (four) times daily. 1 can at 0700, 1100, 1500, and 2000.    [provider]  polyethylene glycol powder (MIRALAX) powder Take 1 capful every day. Titrate as needed. - via peg tube -  as needed for constipation 04/30/17   Armbruster, Carlota Raspberry, MD  primidone (MYSOLINE) 50 MG tablet TAKE 5 TABLETS EVERY MORNING,4 TABLETS IN THE AFTERNOON, AND 5 TABLETS IN THE EVENING. 01/11/17   Rockwell Germany, NP  ranitidine (ZANTAC) 150 MG/10ML syrup Place 10 mLs (150 mg total) into feeding tube 2 (two) times daily. 04/30/17   Armbruster, Carlota Raspberry, MD  [name] (RISPERDAL) 0.5 MG tablet TAKE 3 TABS VIA G TUBE AT 7AM, 3 TABS AT 11AM, 3 TABS AT 3PM AND 1 TAB AT BEDTIME. 03/19/17   Rockwell Germany, NP  tiZANidine (ZANAFLEX) 2 MG tablet Crush and give via G-tube - 2 tablets at 7AM, 1+1/2 tablets at 11AM, 1 tablet at 3PM, 2 tablets at 6pm and 1+1/2 tablets at 10PM. 01/11/17   Rockwell Germany, NP    Family History Family History  Problem Relation Age of Onset  . Breast cancer Maternal Grandmother   . Heart disease Maternal Grandfather   . Thyroid disease Maternal Grandfather   . Thyroid disease Maternal Aunt     Social History Social History  Substance Use Topics  . Smoking status: Never Smoker  . Smokeless tobacco: Never Used  . Alcohol use No     Allergies   Other; Penicillins; and Latex   Review of Systems Review of Systems  Unable to perform ROS: Other  Respiratory:  Positive for cough.        Hemoptysis  Gastrointestinal:       Fed by feeding tube  Musculoskeletal:       Wheelchair-bound  Neurological:       Nonverbal   Noncommunicative  Physical Exam Updated Vital Signs BP 105/78 (BP Location: Right Arm)   Pulse 80   Temp 98.2 F (36.8 C) (Rectal)   Resp 18   Wt 25.4 kg (56 lb)   SpO2 92%   BMI 17.09 kg/m   Physical Exam  Constitutional: No distress.  Frail, chronically ill-appearing cachectic  HENT:  Head: Normocephalic and atraumatic.  Eyes: EOM are normal.  Neck: Neck supple.  Cardiovascular: Normal rate and regular rhythm.   Pulmonary/Chest: Effort normal.  Abdominal: Soft. There is no tenderness.  Musculoskeletal:  All 4 extremities  with flexion contractures and muscular atrophy  Neurological: She is alert.  Skin: Capillary refill takes less than 2 seconds.  Nursing note and vitals reviewed.    ED Treatments / Results  Labs (all labs ordered are listed, but only abnormal results are displayed) Labs Reviewed  BASIC METABOLIC PANEL  CBC WITH DIFFERENTIAL/PLATELET  D-DIMER, QUANTITATIVE (NOT AT [facility])    EKG  EKG Interpretation None      Results for orders placed or performed during the hospital encounter of 05/23/17  Culture, blood (Routine X 2) w Reflex to ID Panel  Result Value Ref Range   Specimen Description BLOOD LEFT HAND    Special Requests      BOTTLES DRAWN AEROBIC AND ANAEROBIC Blood Culture results may not be optimal due to an inadequate volume of blood received in culture bottles   Culture PENDING    Report Status PENDING   Culture, blood (Routine X 2) w Reflex to ID Panel  Result Value Ref Range   Specimen Description BLOOD LEFT HAND    Special Requests      BOTTLES DRAWN AEROBIC AND ANAEROBIC Blood Culture adequate volume   Culture PENDING    Report Status PENDING   Basic metabolic panel  Result Value Ref Range   Sodium 139 135 - 145 mmol/L   Potassium 3.9 3.5 - 5.1 mmol/L   Chloride 96  (L) 101 - 111 mmol/L   CO2 31 22 - 32 mmol/L   Glucose, Bld 75 65 - 99 mg/dL   BUN 6 6 - 20 mg/dL   Creatinine, Ser <0.30 (L) 0.44 - 1.00 mg/dL   Calcium 9.2 8.9 - 10.3 mg/dL   GFR calc non Af Amer NOT CALCULATED >60 mL/min   GFR calc Af Amer NOT CALCULATED >60 mL/min   Anion gap 12 5 - 15  CBC with Differential/Platelet  Result Value Ref Range   WBC 8.1 4.0 - 10.5 K/uL   RBC 4.46 3.87 - 5.11 MIL/uL   Hemoglobin 15.0 12.0 - 15.0 g/dL   HCT 46.4 (H) 36.0 - 46.0 %   MCV 104.0 (H) 78.0 - 100.0 fL   MCH 33.6 26.0 - 34.0 pg   MCHC 32.3 30.0 - 36.0 g/dL   RDW 14.1 11.5 - 15.5 %   Platelets 265 150 - 400 K/uL   Neutrophils Relative % 67 %   Neutro Abs 5.5 1.7 - 7.7 K/uL   Lymphocytes Relative 20 %   Lymphs Abs 1.6 0.7 - 4.0 K/uL   Monocytes Relative 7 %   Monocytes Absolute 0.6 0.1 - 1.0 K/uL   Eosinophils Relative 5 %   Eosinophils Absolute 0.4 0.0 - 0.7 K/uL   Basophils Relative 1 %   Basophils Absolute 0.0 0.0 - 0.1 K/uL  D-dimer, quantitative (not at [facility])  Result Value Ref Range   D-Dimer, Quant 0.39 0.00 - 0.50 ug/mL-FEU   Dg Chest Port 1 View  Result Date: 05/23/2017 CLINICAL DATA:  Productive cough and bloody sputum or vomiting is during suctioning this morning. History of infantile cerebral palsy, asthma. EXAM: PORTABLE CHEST 1 VIEW COMPARISON:  Chest x-ray of August 06, 2016 FINDINGS: The lungs are adequately inflated. There is patchy increased density in the right perihilar region which is new. The interstitial markings within the left lung are coarse but stable. The heart is not enlarged. The pulmonary vascularity is not engorged. Severe dextroscoliosis is stable. A ventriculo atrial shunt tube is present but cannot be follow up below approximately T4. IMPRESSION: Subtle  increased density in the right perihilar region may reflect developing pneumonia or an area of atelectasis. The examination is otherwise stable. The ventricular shunt tube cannot be followed on this study  below the upper thorax. Electronically Signed   By: David  Martinique M.D.   On: 05/23/2017 10:54    Radiology Dg Chest Port 1 View  Result Date: 05/23/2017 CLINICAL DATA:  Productive cough and bloody sputum or vomiting is during suctioning this morning. History of infantile cerebral palsy, asthma. EXAM: PORTABLE CHEST 1 VIEW COMPARISON:  Chest x-ray of August 06, 2016 FINDINGS: The lungs are adequately inflated. There is patchy increased density in the right perihilar region which is new. The interstitial markings within the left lung are coarse but stable. The heart is not enlarged. The pulmonary vascularity is not engorged. Severe dextroscoliosis is stable. A ventriculo atrial shunt tube is present but cannot be follow up below approximately T4. IMPRESSION: Subtle increased density in the right perihilar region may reflect developing pneumonia or an area of atelectasis. The examination is otherwise stable. The ventricular shunt tube cannot be followed on this study below the upper thorax. Electronically Signed   By: David  Martinique M.D.   On: 05/23/2017 10:54    Procedures Procedures (including critical care time)  Medications Ordered in ED Medications - No data to display   Initial Impression / Assessment and Plan / ED Course  I have reviewed the triage vital signs and the nursing notes.  Pertinent labs & imaging results that were available during my care of the patient were reviewed by me and considered in my medical decision making (see chart for details).     Patient is DNR CODE STATUS per mother.  At 3:55 PM she is resting comfortably after treatment with intravenous antibiotics, Rocephin and Zithromax and 2 albuterol nebulized treatments.  Pulse oximetry 94% on room air.  Patient looks well to mother Plan prescription Zithromax.  She has home oxygen and home pulse oximetry,, as well as albuterol home nebulizer treatments.  In shared decision making with mother.  Mother prefers to take  patient home.  She has all necessary resources and monitoring equipment available  Final Clinical Impressions(s) / ED Diagnoses  Diagnosis #1 community acquired pneumonia Final diagnoses:  Blood in sputum  #2 hemoptysis  New Prescriptions New Prescriptions   No medications on file     Orlie Dakin, MD 05/23/17 [phone]

## 2017-05-23 NOTE — ED Triage Notes (Signed)
PT's mother (caretaker) reports cough with productive bloody/sputum or vomit while suctioning her this am x2. PT is non-verbal and has feeding tube and is NPO.

## 2017-05-23 NOTE — ED Notes (Signed)
Pt sleeping oxygen saturations drop to low 80%s. Once alert pt back up to above 90.% oxygen at bedside and mother applying as needed at her request due to agitating patient. Pt does not appear to be in distress or pain. Will continue to monitor while receiving antibiotics.

## 2017-05-23 NOTE — ED Notes (Signed)
Pt caregiver noticed blood during suctioning this am. NAD noted. Pt has cough which is not new.mother states only new meds are CBD oil 0.67ml and reglan for past couple of weeks. Denies fever or change in mental status. Changed feeding tube Monday but was normal per mother putting it in.

## 2017-05-24 ENCOUNTER — Encounter (HOSPITAL_COMMUNITY): Payer: Self-pay

## 2017-05-24 ENCOUNTER — Emergency Department (HOSPITAL_COMMUNITY): Payer: 59

## 2017-05-24 ENCOUNTER — Telehealth: Payer: Self-pay | Admitting: Adult Health

## 2017-05-24 ENCOUNTER — Inpatient Hospital Stay (HOSPITAL_COMMUNITY)
Admission: EM | Admit: 2017-05-24 | Discharge: 2017-06-22 | DRG: 871 | Disposition: E | Payer: 59 | Attending: Internal Medicine | Admitting: Internal Medicine

## 2017-05-24 DIAGNOSIS — J9621 Acute and chronic respiratory failure with hypoxia: Secondary | ICD-10-CM | POA: Diagnosis present

## 2017-05-24 DIAGNOSIS — J9819 Other pulmonary collapse: Secondary | ICD-10-CM | POA: Diagnosis present

## 2017-05-24 DIAGNOSIS — Z681 Body mass index (BMI) 19 or less, adult: Secondary | ICD-10-CM | POA: Diagnosis not present

## 2017-05-24 DIAGNOSIS — F72 Severe intellectual disabilities: Secondary | ICD-10-CM | POA: Diagnosis present

## 2017-05-24 DIAGNOSIS — Z982 Presence of cerebrospinal fluid drainage device: Secondary | ICD-10-CM | POA: Diagnosis not present

## 2017-05-24 DIAGNOSIS — Z66 Do not resuscitate: Secondary | ICD-10-CM | POA: Diagnosis present

## 2017-05-24 DIAGNOSIS — G809 Cerebral palsy, unspecified: Secondary | ICD-10-CM | POA: Diagnosis present

## 2017-05-24 DIAGNOSIS — J189 Pneumonia, unspecified organism: Secondary | ICD-10-CM

## 2017-05-24 DIAGNOSIS — J690 Pneumonitis due to inhalation of food and vomit: Secondary | ICD-10-CM | POA: Diagnosis not present

## 2017-05-24 DIAGNOSIS — Q6589 Other specified congenital deformities of hip: Secondary | ICD-10-CM | POA: Diagnosis not present

## 2017-05-24 DIAGNOSIS — G910 Communicating hydrocephalus: Secondary | ICD-10-CM | POA: Diagnosis present

## 2017-05-24 DIAGNOSIS — Z91040 Latex allergy status: Secondary | ICD-10-CM | POA: Diagnosis not present

## 2017-05-24 DIAGNOSIS — G808 Other cerebral palsy: Secondary | ICD-10-CM | POA: Diagnosis present

## 2017-05-24 DIAGNOSIS — Z981 Arthrodesis status: Secondary | ICD-10-CM

## 2017-05-24 DIAGNOSIS — A419 Sepsis, unspecified organism: Secondary | ICD-10-CM | POA: Diagnosis not present

## 2017-05-24 DIAGNOSIS — K219 Gastro-esophageal reflux disease without esophagitis: Secondary | ICD-10-CM | POA: Diagnosis present

## 2017-05-24 DIAGNOSIS — M8580 Other specified disorders of bone density and structure, unspecified site: Secondary | ICD-10-CM | POA: Diagnosis present

## 2017-05-24 DIAGNOSIS — Z515 Encounter for palliative care: Secondary | ICD-10-CM | POA: Diagnosis not present

## 2017-05-24 DIAGNOSIS — R042 Hemoptysis: Secondary | ICD-10-CM | POA: Diagnosis present

## 2017-05-24 DIAGNOSIS — Z880 Allergy status to penicillin: Secondary | ICD-10-CM

## 2017-05-24 DIAGNOSIS — Z789 Other specified health status: Secondary | ICD-10-CM

## 2017-05-24 DIAGNOSIS — G40909 Epilepsy, unspecified, not intractable, without status epilepticus: Secondary | ICD-10-CM | POA: Diagnosis present

## 2017-05-24 DIAGNOSIS — J4530 Mild persistent asthma, uncomplicated: Secondary | ICD-10-CM | POA: Diagnosis not present

## 2017-05-24 DIAGNOSIS — E43 Unspecified severe protein-calorie malnutrition: Secondary | ICD-10-CM | POA: Diagnosis present

## 2017-05-24 DIAGNOSIS — Z79899 Other long term (current) drug therapy: Secondary | ICD-10-CM

## 2017-05-24 DIAGNOSIS — Z7189 Other specified counseling: Secondary | ICD-10-CM | POA: Diagnosis not present

## 2017-05-24 DIAGNOSIS — M419 Scoliosis, unspecified: Secondary | ICD-10-CM | POA: Diagnosis present

## 2017-05-24 DIAGNOSIS — J4520 Mild intermittent asthma, uncomplicated: Secondary | ICD-10-CM | POA: Diagnosis not present

## 2017-05-24 DIAGNOSIS — Z931 Gastrostomy status: Secondary | ICD-10-CM

## 2017-05-24 DIAGNOSIS — J9601 Acute respiratory failure with hypoxia: Secondary | ICD-10-CM | POA: Diagnosis present

## 2017-05-24 DIAGNOSIS — E876 Hypokalemia: Secondary | ICD-10-CM | POA: Diagnosis present

## 2017-05-24 DIAGNOSIS — F419 Anxiety disorder, unspecified: Secondary | ICD-10-CM | POA: Diagnosis present

## 2017-05-24 DIAGNOSIS — F79 Unspecified intellectual disabilities: Secondary | ICD-10-CM | POA: Diagnosis present

## 2017-05-24 DIAGNOSIS — R0600 Dyspnea, unspecified: Secondary | ICD-10-CM | POA: Diagnosis not present

## 2017-05-24 DIAGNOSIS — J45909 Unspecified asthma, uncomplicated: Secondary | ICD-10-CM | POA: Diagnosis present

## 2017-05-24 LAB — CBC WITH DIFFERENTIAL/PLATELET
BASOS ABS: 0 10*3/uL (ref 0.0–0.1)
Basophils Relative: 0 %
Eosinophils Absolute: 0.2 10*3/uL (ref 0.0–0.7)
Eosinophils Relative: 2 %
HCT: 46.5 % — ABNORMAL HIGH (ref 36.0–46.0)
HEMOGLOBIN: 15.6 g/dL — AB (ref 12.0–15.0)
LYMPHS ABS: 1.9 10*3/uL (ref 0.7–4.0)
LYMPHS PCT: 20 %
MCH: 34.2 pg — AB (ref 26.0–34.0)
MCHC: 33.5 g/dL (ref 30.0–36.0)
MCV: 102 fL — AB (ref 78.0–100.0)
Monocytes Absolute: 0.9 10*3/uL (ref 0.1–1.0)
Monocytes Relative: 9 %
NEUTROS ABS: 6.4 10*3/uL (ref 1.7–7.7)
NEUTROS PCT: 69 %
Platelets: 306 10*3/uL (ref 150–400)
RBC: 4.56 MIL/uL (ref 3.87–5.11)
RDW: 14.2 % (ref 11.5–15.5)
WBC: 9.4 10*3/uL (ref 4.0–10.5)

## 2017-05-24 LAB — PROTIME-INR
INR: 1.13
Prothrombin Time: 14.4 seconds (ref 11.4–15.2)

## 2017-05-24 MED ORDER — ACETAMINOPHEN 650 MG RE SUPP: 650.0000 mg | Freq: Four times a day (QID) | RECTAL | Status: DC | PRN

## 2017-05-24 MED ORDER — RANITIDINE HCL 150 MG/10ML PO SYRP
150.0000 mg | ORAL_SOLUTION | Freq: Two times a day (BID) | ORAL | Status: DC
  Administered 2017-05-24 – 2017-05-31 (×14): 150 mg
  Filled 2017-05-24 (×16): qty 10

## 2017-05-24 MED ORDER — HEPARIN SODIUM (PORCINE) 5000 UNIT/ML IJ SOLN: 5000.0000 [IU] | Freq: Three times a day (TID) | INTRAMUSCULAR | Status: DC

## 2017-05-24 MED ORDER — RISPERIDONE 1 MG PO TABS: 1.5000 mg | ORAL_TABLET | Freq: Three times a day (TID) | ORAL | Status: DC

## 2017-05-24 MED ORDER — PRIMIDONE 250 MG PO TABS
250.0000 mg | ORAL_TABLET | Freq: Two times a day (BID) | ORAL | Status: DC
  Administered 2017-05-24 – 2017-05-31 (×14): 250 mg
  Filled 2017-05-24 (×16): qty 1

## 2017-05-24 MED ORDER — ARFORMOTEROL TARTRATE 15 MCG/2ML IN NEBU
15.0000 ug | INHALATION_SOLUTION | Freq: Two times a day (BID) | RESPIRATORY_TRACT | Status: DC
  Administered 2017-05-24 – 2017-05-31 (×14): 15 ug via RESPIRATORY_TRACT
  Filled 2017-05-24 (×14): qty 2

## 2017-05-24 MED ORDER — HYDROCODONE-ACETAMINOPHEN 7.5-325 MG PO TABS
1.0000 | ORAL_TABLET | Freq: Four times a day (QID) | ORAL | Status: DC | PRN
  Administered 2017-05-27: 1
  Filled 2017-05-24 (×2): qty 1

## 2017-05-24 MED ORDER — RISPERIDONE 1 MG PO TABS
1.5000 mg | ORAL_TABLET | Freq: Three times a day (TID) | ORAL | Status: DC
  Administered 2017-05-25 – 2017-05-31 (×20): 1.5 mg
  Filled 2017-05-24 (×19): qty 2

## 2017-05-24 MED ORDER — BACLOFEN 10 MG PO TABS: 20.0000 mg | ORAL_TABLET | Freq: Four times a day (QID) | ORAL | Status: DC

## 2017-05-24 MED ORDER — POLYETHYLENE GLYCOL 3350 17 G PO PACK: 17.0000 g | PACK | Freq: Every day | ORAL | Status: DC | PRN

## 2017-05-24 MED ORDER — JEVITY 1.5 CAL/FIBER PO LIQD
237.0000 mL | Freq: Four times a day (QID) | ORAL | Status: DC
  Filled 2017-05-24 (×3): qty 1000

## 2017-05-24 MED ORDER — ALBUTEROL SULFATE (2.5 MG/3ML) 0.083% IN NEBU: 5.0000 mg | INHALATION_SOLUTION | RESPIRATORY_TRACT | Status: DC | PRN

## 2017-05-24 MED ORDER — VANCOMYCIN HCL 500 MG IV SOLR
500.0000 mg | Freq: Once | INTRAVENOUS | Status: AC
  Administered 2017-05-24: 500 mg via INTRAVENOUS
  Filled 2017-05-24: qty 500

## 2017-05-24 MED ORDER — PRIMIDONE 250 MG PO TABS
250.0000 mg | ORAL_TABLET | Freq: Two times a day (BID) | ORAL | Status: DC
  Filled 2017-05-24 (×2): qty 1

## 2017-05-24 MED ORDER — SODIUM CHLORIDE 0.9 % IV BOLUS (SEPSIS)
1000.0000 mL | Freq: Once | INTRAVENOUS | Status: AC
  Administered 2017-05-24: 1000 mL via INTRAVENOUS

## 2017-05-24 MED ORDER — BUDESONIDE 0.25 MG/2ML IN SUSP
0.2500 mg | Freq: Two times a day (BID) | RESPIRATORY_TRACT | Status: DC
  Administered 2017-05-24 – 2017-05-31 (×14): 0.25 mg via RESPIRATORY_TRACT
  Filled 2017-05-24 (×14): qty 2

## 2017-05-24 MED ORDER — ACETAMINOPHEN 325 MG PO TABS
650.0000 mg | ORAL_TABLET | Freq: Four times a day (QID) | ORAL | Status: DC | PRN
  Administered 2017-05-26 (×2): 650 mg
  Filled 2017-05-24 (×2): qty 2

## 2017-05-24 MED ORDER — ALBUTEROL SULFATE (2.5 MG/3ML) 0.083% IN NEBU
5.0000 mg | INHALATION_SOLUTION | Freq: Once | RESPIRATORY_TRACT | Status: AC
  Administered 2017-05-24: 5 mg via RESPIRATORY_TRACT
  Filled 2017-05-24: qty 6

## 2017-05-24 MED ORDER — IOPAMIDOL (ISOVUE-300) INJECTION 61%
INTRAVENOUS | Status: AC
  Administered 2017-05-24: 75 mL via INTRAVENOUS
  Filled 2017-05-24: qty 75

## 2017-05-24 MED ORDER — SODIUM CHLORIDE 0.9 % IV SOLN
250.0000 mg | Freq: Once | INTRAVENOUS | Status: AC
  Administered 2017-05-24: 250 mg via INTRAVENOUS
  Filled 2017-05-24: qty 2.5

## 2017-05-24 MED ORDER — PRIMIDONE 50 MG PO TABS: 200.0000 mg | ORAL_TABLET | ORAL | Status: DC

## 2017-05-24 MED ORDER — ALBUTEROL SULFATE (2.5 MG/3ML) 0.083% IN NEBU
5.0000 mg | INHALATION_SOLUTION | RESPIRATORY_TRACT | Status: DC | PRN
  Administered 2017-05-25: 2.5 mg via RESPIRATORY_TRACT
  Filled 2017-05-24: qty 6

## 2017-05-24 MED ORDER — DIAZEPAM 1 MG/ML PO SOLN
1.0000 mg | Freq: Three times a day (TID) | ORAL | Status: DC | PRN
  Administered 2017-05-27: 1 mg
  Filled 2017-05-24: qty 5

## 2017-05-24 MED ORDER — POLYETHYLENE GLYCOL 3350 17 GM/SCOOP PO POWD: 1.0000 | Freq: Every day | ORAL | Status: DC | PRN

## 2017-05-24 MED ORDER — SODIUM CHLORIDE 0.9% FLUSH
3.0000 mL | Freq: Two times a day (BID) | INTRAVENOUS | Status: DC
  Administered 2017-05-27 – 2017-05-28 (×4): 3 mL via INTRAVENOUS

## 2017-05-24 MED ORDER — ALBUTEROL SULFATE (2.5 MG/3ML) 0.083% IN NEBU: 2.5000 mg | INHALATION_SOLUTION | Freq: Four times a day (QID) | RESPIRATORY_TRACT | Status: DC

## 2017-05-24 MED ORDER — PRIMIDONE 50 MG PO TABS
200.0000 mg | ORAL_TABLET | ORAL | Status: DC
  Administered 2017-05-25 – 2017-05-31 (×7): 200 mg
  Filled 2017-05-24 (×7): qty 4

## 2017-05-24 MED ORDER — ACETAMINOPHEN 325 MG PO TABS: 650.0000 mg | ORAL_TABLET | Freq: Four times a day (QID) | ORAL | Status: DC | PRN

## 2017-05-24 MED ORDER — METOCLOPRAMIDE HCL 5 MG/5ML PO SOLN
5.0000 mg | Freq: Three times a day (TID) | ORAL | Status: DC
  Filled 2017-05-24: qty 5

## 2017-05-24 MED ORDER — HYDROCODONE-ACETAMINOPHEN 7.5-325 MG PO TABS: 1.0000 | ORAL_TABLET | Freq: Four times a day (QID) | ORAL | Status: DC | PRN

## 2017-05-24 MED ORDER — DEXTROSE 5 % IV SOLN
500.0000 mg | Freq: Once | INTRAVENOUS | Status: AC
  Administered 2017-05-24: 500 mg via INTRAVENOUS
  Filled 2017-05-24: qty 500

## 2017-05-24 MED ORDER — BACLOFEN 10 MG PO TABS
20.0000 mg | ORAL_TABLET | Freq: Four times a day (QID) | ORAL | Status: DC
  Administered 2017-05-25 – 2017-05-31 (×25): 20 mg
  Filled 2017-05-24 (×25): qty 2

## 2017-05-24 MED ORDER — LORAZEPAM 2 MG/ML IJ SOLN
0.5000 mg | Freq: Once | INTRAMUSCULAR | Status: AC
  Administered 2017-05-24: 0.5 mg via INTRAVENOUS
  Filled 2017-05-24: qty 1

## 2017-05-24 MED ORDER — CEFEPIME HCL 1 G IJ SOLR
1.0000 g | Freq: Once | INTRAMUSCULAR | Status: AC
  Administered 2017-05-24: 1 g via INTRAVENOUS
  Filled 2017-05-24: qty 1

## 2017-05-24 MED ORDER — ALBUTEROL SULFATE (2.5 MG/3ML) 0.083% IN NEBU
2.5000 mg | INHALATION_SOLUTION | Freq: Three times a day (TID) | RESPIRATORY_TRACT | Status: DC
  Administered 2017-05-25 (×2): 2.5 mg via RESPIRATORY_TRACT
  Filled 2017-05-24 (×2): qty 3

## 2017-05-24 NOTE — ED Notes (Signed)
Unable to collect blood cultures before antibiotics. Pt is difficult access and both the EDP and myself decided that it was best to start antibiotics ASAP. Will consult to lab for additional blood work.

## 2017-05-24 NOTE — ED Provider Notes (Signed)
31 yo F here with worsening cough, SOB, hemoptysis in setting of recent PNA diagnosis. H/o CP/MR, pt is DNR. Plan for hospitalist to eval.  Pt with increasing hemoptysis and hypoxia in ED. Dr. Bonner Puna initially evaluated, plan was for CT and d/c if unremarkable but given persistent hemoptysis, will admit. Pulm will be contacted by Hospitalist.  Dr. Bonner Puna notified of pt's status, will admit to step down. CT is c/w significant PNA, possible aspiration. Broad-spectrum ABX given. Admit to step down. Pt DNR but will need o/w full supportive care.   Duffy Bruce, MD 05/31/2017 2350

## 2017-05-24 NOTE — ED Notes (Signed)
Unsuccessful IV insertion. [place] IV requested.

## 2017-05-24 NOTE — Telephone Encounter (Signed)
Spoke with patient's mother. She stated that the patient was seen in the ED at [facility] last night was discharged. ED doctors told her that she has PNA and that the blood she was coughing up was from irration. Patient was sent home but has not stop coughing blood.   Mother stated that since the time of this phone call she has decided to call EMS and have the patient sent to [facility] ED for further evaluation.   Nothing else was needed at time of call.

## 2017-05-24 NOTE — ED Triage Notes (Addendum)
Per EMS, pt was suctioned today, caretaker noted blood in saliva. Pt has hx of cerebral palsy. Pt has sore in mouth that is not currently bleeding. Pt seen in ED and dx'd with pnu yesterday,

## 2017-05-24 NOTE — Progress Notes (Signed)
Pharmacy Antibiotic Note  Renee [name] is a 31 y.o. female admitted on 05/29/2017 with pneumonia.  Pharmacy has been consulted for Vancomycin & Cefepime dosing.  Plan: Vancomycin 500mg  x1 in ED, then q12 Cefepime 1gm x1 in ED, then q12  Height: 4' (121.9 cm) Weight: 56 lb (25.4 kg) IBW/kg (Calculated) : 17.9  Temp (24hrs), Avg:99 F (37.2 C), Min:99 F (37.2 C), Max:99 F (37.2 C)   Recent Labs Lab 05/23/17 1156 06/13/2017 1446  WBC 8.1 9.4  CREATININE <0.30*  --     CrCl cannot be calculated (This lab value cannot be used to calculate CrCl because it is not a number: <0.30).    Allergies  Allergen Reactions  . Other     Paper and adhesive tape - blisters  . Penicillins Rash    Has patient had a PCN reaction causing immediate rash, facial/tongue/throat swelling, SOB or lightheadedness with hypotension:yes Has patient had a PCN reaction causing severe rash involving mucus membranes or skin necrosis: NO Has patient had a PCN reaction that required hospitalization: NO Has patient had a PCN reaction occurring within the last 10 years: NO If all of the above answers are "NO", then may proceed with Cephalosporin use.   . Latex Rash and Other (See Comments)    blisters   Antimicrobials this admission: 11/2 Rocephin/Azithromycin  >> x1 ED 11/2 Cefepime >>  11/2 Vancomycin >>   Dose adjustments this admission:  Microbiology results: 11/2 BCx: sent         Sputum: ordered  11/2 MRSA PCR: sent  Thank you for allowing pharmacy to be a part of this patient's care.  Minda Ditto 06/15/2017 10:49 PM

## 2017-05-24 NOTE — ED Provider Notes (Addendum)
[facility] DEPT Provider Note   CSN: [id number] Arrival date & time: 06/18/2017  1201     History   Chief Complaint Chief Complaint  Patient presents with  . Hemoptysis  level5 caveat patient noncommunicative.  History is obtained from patient's mother  HPI Renee [name] is a 31 y.o. female.  HPI Patient was seen by me yesterday at [facility] complaining of hemoptysis which started yesterday.  After lengthy discussion with mother yesterday she was sent home after treatment with intravenous antibiotics to include Rocephin and Zithromax and sent home with prescription for Zithromax.  She has not taken any of the antibiotic prescribed since discharge yesterday.  She presents today as she coughed up more blood this morning mother says she had a good night last night.  No fever.  No other associated symptoms.   Past Medical History:  Diagnosis Date  . Asthma    breathing treatment  . Bone spur   . Broken femur ([place]) 12/23/15  . Cerebral palsy ([place])   . DNI (do not intubate)   . DNR (do not resuscitate)   . DNR (do not resuscitate) 04/28/2014  . Hip dysplasia    bilateral  . Hydrocephalus   . Mental retardation   . Osteopenia   . Scoliosis   . Seizure disorder ([place])   . UTI (urinary tract infection)     Patient Active Problem List   Diagnosis Date Noted  . Pain of left femur 11/23/2016  . Bilateral hip pain 11/23/2016  . Thrombocytosis ([place]) 08/06/2016  . Closed fracture of right femur ([place])   . Asthma 12/24/2015  . Closed right hip fracture ([place]) 12/23/2015  . 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 Acute bronchitis 04/26/2015  . DNR (do not resuscitate) 04/28/2014  . Sepsis ([place]) 04/26/2014  . UTI (urinary tract infection) 04/26/2014  . Cellulitis of hip 04/26/2014  . LPRD (laryngopharyngeal reflux disease) 03/09/2014  . Protein-calorie malnutrition, severe ([place]) 12/25/2013  . Malnourished ([place]) 12/25/2013  .  Aspiration pneumonia ([place]) 12/24/2013  . Acute and chronic respiratory failure 12/24/2013  . Broken or cracked tooth, nontraumatic 06/03/2013  . 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 Severe intellectual disabilities 01/02/2013  . 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 Irritability 01/02/2013  . Other and unspecified special symptom or syndrome, not elsewhere classified 01/02/2013  . 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 Presence of cerebrospinal fluid drainage device 01/02/2013  . Unspecified constipation 01/02/2013  . Myoclonus 01/02/2013  . Infantile cerebral palsy ([place]) 10/14/2009  . Mild persistent asthma 10/14/2009    Past Surgical History:  Procedure Laterality Date  . GASTROSTOMY TUBE PLACEMENT     at same time as nissen  . ILEOPSOAS RELEASE Bilateral    Dr Susy Frizzle  . LAPAROSCOPIC NISSEN FUNDOPLICATION    . SPINAL FUSION  2005  . TENOTOMY ADDUCTOR / HAMSTRING CLOSED Bilateral 04/25/00  . TYMPANOSTOMY TUBE PLACEMENT    . vt shunt placement  1989   At birth    OB History    Gravida Para Term Preterm AB Living   0 0 0 0 0 0   SAB TAB Ectopic Multiple Live Births   0 0 0 0         Home Medications    Prior to Admission medications   Medication Sig Start Date End Date Taking? Authorizing Provider  albuterol (PROVENTIL) (2.5 MG/3ML) 0.083% nebulizer solution USE 1  VIAL IN NEBULIZER EVERY 6 HOURS FOR WHEEZING OR SHORTNESS OF BREATH. 03/15/17  Yes [name], MD  ALPRAZolam Duanne Moron) 0.25 MG tablet Give 1/4 to 1/2 tablet up to twice per day as needed for severe irritability 06/20/15  Yes Rockwell Germany, NP  arformoterol (BROVANA) 15 MCG/2ML NEBU Take 2 mLs (15 mcg total) by nebulization 2 (two) times daily. 09/09/15  Yes [name], MD  azithromycin (ZITHROMAX) 200 MG/5ML suspension Take 6.4 mLs (256 mg total) by mouth daily. 05/23/17  Yes Orlie Dakin, MD   baclofen (LIORESAL) 10 MG tablet Give 2 tablets at 7AM, 11AM, 3PM and 10PM. May give additional 1 tablet at [facility] if needed 01/11/17  Yes Goodpasture, Otila Kluver, NP  budesonide (PULMICORT) 0.25 MG/2ML nebulizer solution Take 2 mLs (0.25 mg total) by nebulization 2 (two) times daily. 04/09/17  Yes [name], MD  diazepam (VALIUM) 1 MG/ML solution GIVE 3ML EVERY 8 HOURS AS NEEDED FOR AGITATION. 01/11/17  Yes Rockwell Germany, NP  HYDROcodone-acetaminophen (NORCO) 7.5-325 MG tablet Take 1 tablet by mouth every 6 (six) hours as needed for moderate pain. 11/23/16  Yes Newt Minion, MD  medroxyPROGESTERone (DEPO-PROVERA) 150 MG/ML injection INJECT 1ML INTO THE MUSCLE EVERY 3 MONTHS. 10/22/16  Yes Estill Dooms, NP  metoCLOPramide (REGLAN) 5 MG/5ML solution Use 2.5mg  prior to meals.  Can increase to 5mg  as needed 04/30/17  Yes Armbruster, Carlota Raspberry, MD  Nutritional Supplements (FEEDING SUPPLEMENT, JEVITY 1.5 CAL/FIBER,) LIQD Place 237 mLs into feeding tube 4 (four) times daily. 1 can at 0700, 1100, 1500, and 2000.   Yes [provider]  polyethylene glycol powder (MIRALAX) powder Take 1 capful every day. Titrate as needed. - via peg tube -  as needed for constipation 04/30/17  Yes Armbruster, Carlota Raspberry, MD  primidone (MYSOLINE) 50 MG tablet TAKE 5 TABLETS EVERY MORNING,4 TABLETS IN THE AFTERNOON, AND 5 TABLETS IN THE EVENING. 01/11/17  Yes Rockwell Germany, NP  ranitidine (ZANTAC) 150 MG/10ML syrup Place 10 mLs (150 mg total) into feeding tube 2 (two) times daily. 04/30/17  Yes Armbruster, Carlota Raspberry, MD  [name] (RISPERDAL) 0.5 MG tablet TAKE 3 TABS VIA G TUBE AT 7AM, 3 TABS AT 11AM, 3 TABS AT 3PM AND 1 TAB AT BEDTIME. 03/19/17  Yes Rockwell Germany, NP  tiZANidine (ZANAFLEX) 2 MG tablet Crush and give via G-tube - 2 tablets at 7AM, 1+1/2 tablets at 11AM, 1 tablet at 3PM, 2 tablets at 6pm and 1+1/2 tablets at 10PM. 01/11/17  Yes Rockwell Germany, NP    Family History Family History  Problem Relation  Age of Onset  . Breast cancer Maternal Grandmother   . Heart disease Maternal Grandfather   . Thyroid disease Maternal Grandfather   . Thyroid disease Maternal Aunt     Social History Social History  Substance Use Topics  . Smoking status: Never Smoker  . Smokeless tobacco: Never Used  . Alcohol use No     Allergies   Other; Penicillins; and Latex   Review of Systems Review of Systems  Unable to perform ROS: Other  Respiratory: Positive for cough.        Hemoptysis  Patient noncommunicative  Physical Exam Updated Vital Signs BP 96/65   Pulse (!) 114   Temp 99 F (37.2 C) (Axillary)   Resp 16   SpO2 91%   Physical Exam  Constitutional:  Chronically ill-appearing  HENT:  Head: Normocephalic and atraumatic.  Right Ear: External ear normal.  Left Ear: External ear normal.  Microcephalic  Eyes: Pupils are equal, round, and reactive to light. Conjunctivae are normal.  Neck: Neck supple. No tracheal deviation present. No thyromegaly present.  Cardiovascular: Normal rate and regular rhythm.   No murmur heard. Pulmonary/Chest: Effort normal and breath sounds normal.  Abdominal: She exhibits no distension. There is no tenderness.  Musculoskeletal: She exhibits no edema.  All 4 extremities with flexion contractures and muscular atrophy  Neurological: She is alert. Coordination normal.   doesNot follow simple commands  Skin: Skin is warm and dry. No rash noted.  Psychiatric: She has a normal mood and affect.  Nursing note and vitals reviewed.    ED Treatments / Results  Labs (all labs ordered are listed, but only abnormal results are displayed) Labs Reviewed  CBC WITH DIFFERENTIAL/PLATELET - Abnormal; Notable for the following:       Result Value   Hemoglobin 15.6 (*)    HCT 46.5 (*)    MCV 102.0 (*)    MCH 34.2 (*)    All other components within normal limits    EKG  EKG Interpretation None       Radiology Dg Chest Port 1 View  Result Date:  05/23/2017 CLINICAL DATA:  Productive cough and bloody sputum or vomiting is during suctioning this morning. History of infantile cerebral palsy, asthma. EXAM: PORTABLE CHEST 1 VIEW COMPARISON:  Chest x-ray of August 06, 2016 FINDINGS: The lungs are adequately inflated. There is patchy increased density in the right perihilar region which is new. The interstitial markings within the left lung are coarse but stable. The heart is not enlarged. The pulmonary vascularity is not engorged. Severe dextroscoliosis is stable. A ventriculo atrial shunt tube is present but cannot be follow up below approximately T4. IMPRESSION: Subtle increased density in the right perihilar region may reflect developing pneumonia or an area of atelectasis. The examination is otherwise stable. The ventricular shunt tube cannot be followed on this study below the upper thorax. Electronically Signed   By: David  Martinique M.D.   On: 05/23/2017 10:54    Procedures Procedures (including critical care time)  Medications Ordered in ED Medications  azithromycin (ZITHROMAX) 500 mg in dextrose 5 % 250 mL IVPB (not administered)   chestX-ray reviewed by me  Initial Impression / Assessment and Plan / ED Course  I have reviewed the triage vital signs and the nursing notes.  Pertinent labs & imaging results that were available during my care of the patient were reviewed by me and considered in my medical decision making (see chart for details).    IV azithromycin ordered by me.  Hospitalist service consulted and will evaluate patient in the ED for admission.  I suspect that she needs pulmonary consultation in light of worsening hemoptysis.  Pulmonary embolism was ruled out yesterday . Marland Kitchen  Pt's hemoglobin remains stable.  Patient does have oxygen requirement   530 pmDr Ellender Hose  Aware of pt pending hospitalist evaluaton as my shift is over Final Clinical Impressions(s) / ED Diagnoses  Dx #1 hemoptysis #2 hypoxia Final diagnoses:  None     New Prescriptions New Prescriptions   No medications on file     Orlie Dakin, MD 06/10/2017 1640    Orlie Dakin, MD 06/10/2017 1735

## 2017-05-24 NOTE — H&P (Signed)
History and Physical   Renee [name] JKD:[id number] DOB: 03-26-1986 DOA: 05/29/2017  Referring MD/NP/PA: Winfred Leeds EDP PCP: Sharilyn Sites, MD  Chief Complaint: Hemoptysis  HPI: Renee [name] is a 31 y.o. female with a history of CP, recurrent aspiration pneumonia s/p PEG who presents from home by EMS for hemoptysis. Her caretaker noted hemoptysis while she was providing routine airway suctioning this morning, described as bright red blood that was expelled onto her shirt with admixed clots. There was a single episode today after seeming improvement in her overall breathing status since being discharged from the ED yesterday. She had been seen in the ED with cough, acute on chronic hypoxia, found to have an infiltrate on CXR, treated with IV abx x1 and discharged with scant hemoptysis noted, no leukocytosis, stable hgb, and normal BMP. Blood cultures were drawn, and she has not yet taken any other antibiotics prior to returning to the ED today.   ED Course: She appeared in her baseline state of health with hgb of 15.6mg /dl and has had no discrete hemoptysis episodes like what prompted evaluation, but has had suctioning of some phlegm/white secretions with some blood. CXR demonstrates stable infiltrate. TRH called for consult regarding workup/treatment of hemoptysis.   Review of Systems: Low grade fevers, cough, no reported shortness of breath though pt is not reliable due to cerebral palsy. No vaginal or other bleeding, no rashes/bruising, and per HPI. All others reviewed with mother and are negative.   Past Medical History:  Diagnosis Date  . Asthma    breathing treatment  . Bone spur   . Broken femur ([place]) 12/23/15  . Cerebral palsy ([place])   . DNI (do not intubate)   . DNR (do not resuscitate)   . DNR (do not resuscitate) 04/28/2014  . Hip dysplasia    bilateral  . Hydrocephalus   . Mental retardation   . Osteopenia   . Scoliosis   . Seizure disorder ([place])   . UTI (urinary tract infection)     Past Surgical History:  Procedure Laterality Date  . GASTROSTOMY TUBE PLACEMENT     at same time as nissen  . ILEOPSOAS RELEASE Bilateral    Dr Susy Frizzle  . LAPAROSCOPIC NISSEN FUNDOPLICATION    . SPINAL FUSION  2005  . TENOTOMY ADDUCTOR / HAMSTRING CLOSED Bilateral 04/25/00  . TYMPANOSTOMY TUBE PLACEMENT    . vt shunt placement  1989   At birth   - Nonsmoker, no EtOH. Lives at home, dependent for total care.   reports that she has never smoked. She has never used smokeless tobacco. She reports that she does not drink alcohol or use drugs. Allergies  Allergen Reactions  . Other     Paper and adhesive tape - blisters  . Penicillins Rash    Has patient had a PCN reaction causing immediate rash, facial/tongue/throat swelling, SOB or lightheadedness with hypotension:yes Has patient had a PCN reaction causing severe rash involving mucus membranes or skin necrosis: NO Has patient had a PCN reaction that required hospitalization: NO Has patient had a PCN reaction occurring within the last 10 years: NO If all of the above answers are "NO", then may proceed with Cephalosporin use.   . Latex Rash and Other (See Comments)    blisters   Family History  Problem Relation Age of Onset  . Breast cancer Maternal Grandmother   . Heart disease Maternal Grandfather   . Thyroid disease Maternal Grandfather   . Thyroid disease Maternal Aunt    -  Family history otherwise reviewed and not pertinent. No early lung CA.  Prior to Admission medications   Medication Sig Start Date End Date Taking? Authorizing Provider  albuterol (PROVENTIL) (2.5 MG/3ML) 0.083% nebulizer solution USE 1 VIAL IN NEBULIZER EVERY 6 HOURS FOR WHEEZING OR SHORTNESS OF BREATH. 03/15/17  Yes [name], MD  ALPRAZolam Duanne Moron) 0.25 MG tablet Give 1/4 to 1/2 tablet up to twice per day as needed for severe irritability 06/20/15  Yes Rockwell Germany, NP  arformoterol (BROVANA) 15 MCG/2ML NEBU Take 2 mLs (15 mcg total) by  nebulization 2 (two) times daily. 09/09/15  Yes [name], MD  azithromycin (ZITHROMAX) 200 MG/5ML suspension Take 6.4 mLs (256 mg total) by mouth daily. 05/23/17  Yes Orlie Dakin, MD  baclofen (LIORESAL) 10 MG tablet Give 2 tablets at 7AM, 11AM, 3PM and 10PM. May give additional 1 tablet at [facility] if needed 01/11/17  Yes Goodpasture, Otila Kluver, NP  budesonide (PULMICORT) 0.25 MG/2ML nebulizer solution Take 2 mLs (0.25 mg total) by nebulization 2 (two) times daily. 04/09/17  Yes [name], MD  diazepam (VALIUM) 1 MG/ML solution GIVE 3ML EVERY 8 HOURS AS NEEDED FOR AGITATION. 01/11/17  Yes Rockwell Germany, NP  HYDROcodone-acetaminophen (NORCO) 7.5-325 MG tablet Take 1 tablet by mouth every 6 (six) hours as needed for moderate pain. 11/23/16  Yes Newt Minion, MD  medroxyPROGESTERone (DEPO-PROVERA) 150 MG/ML injection INJECT 1ML INTO THE MUSCLE EVERY 3 MONTHS. 10/22/16  Yes Estill Dooms, NP  metoCLOPramide (REGLAN) 5 MG/5ML solution Use 2.5mg  prior to meals.  Can increase to 5mg  as needed 04/30/17  Yes Armbruster, Carlota Raspberry, MD  Nutritional Supplements (FEEDING SUPPLEMENT, JEVITY 1.5 CAL/FIBER,) LIQD Place 237 mLs into feeding tube 4 (four) times daily. 1 can at 0700, 1100, 1500, and 2000.   Yes [provider]  polyethylene glycol powder (MIRALAX) powder Take 1 capful every day. Titrate as needed. - via peg tube -  as needed for constipation 04/30/17  Yes Armbruster, Carlota Raspberry, MD  primidone (MYSOLINE) 50 MG tablet TAKE 5 TABLETS EVERY MORNING,4 TABLETS IN THE AFTERNOON, AND 5 TABLETS IN THE EVENING. 01/11/17  Yes Rockwell Germany, NP  ranitidine (ZANTAC) 150 MG/10ML syrup Place 10 mLs (150 mg total) into feeding tube 2 (two) times daily. 04/30/17  Yes Armbruster, Carlota Raspberry, MD  [name] (RISPERDAL) 0.5 MG tablet TAKE 3 TABS VIA G TUBE AT 7AM, 3 TABS AT 11AM, 3 TABS AT 3PM AND 1 TAB AT BEDTIME. 03/19/17  Yes Rockwell Germany, NP  tiZANidine (ZANAFLEX) 2 MG tablet Crush and give via G-tube - 2  tablets at 7AM, 1+1/2 tablets at 11AM, 1 tablet at 3PM, 2 tablets at 6pm and 1+1/2 tablets at 10PM. 01/11/17  Yes Rockwell Germany, NP    Physical Exam: Vitals:   06/09/2017 1224 06/08/2017 1601  BP: 96/65 96/71  Pulse: (!) 114 (!) 114  Resp: 16 (!) 24  Temp: 99 F (37.2 C)   TempSrc: Axillary   SpO2: 91% (!) 86%   Constitutional: 31 y.o. female in no distress Eyes: Lids and conjunctivae normal, PERRL ENMT: Mucous membranes are moist. Posterior pharynx clear of any exudate, erythema, bleeding, clots. No mucosal ulcerations/wounds.  Neck: normal, supple, no masses, no thyromegaly Respiratory: Breathing with supplemental oxygen without accessory muscle use, good air exchange. Pt was moaning intermittently, not cooperative with exam but did not appreciate crackles, wheezes, rhonchi. Cardiovascular: Regular tachycardia, no murmurs, rubs, or gallops. No JVD. No LE edema. 2+ pedal pulses. Abdomen: Normoactive bowel sounds. No tenderness, non-distended,  and no masses palpated. No hepatosplenomegaly. PEG tube capped, without discharge or erythema. GU: No indwelling catheter. No vaginal bleeding.  Musculoskeletal: No clubbing / cyanosis. Spastic flexion contractures x4 with severely atrophied muscles throughout Skin: Warm, dry. No rashes, wounds, no ulcers. No significant lesions noted.  Neurologic: Not cooperative with exam, nonverbal, moaning indistinctly, EOMI.  Psychiatric: UTD.  CBC:  Recent Labs Lab 05/23/17 1156 06/12/2017 1446  WBC 8.1 9.4  NEUTROABS 5.5 6.4  HGB 15.0 15.6*  HCT 46.4* 46.5*  MCV 104.0* 102.0*  PLT 265 182   Basic Metabolic Panel:  Recent Labs Lab 05/23/17 1156  NA 139  K 3.9  CL 96*  CO2 31  GLUCOSE 75  BUN 6  CREATININE <0.30*  CALCIUM 9.2   Recent Results (from the past 240 hour(s))  Culture, blood (Routine X 2) w Reflex to ID Panel     Status: None (Preliminary result)   Collection Time: 05/23/17  2:40 PM  Result Value Ref Range Status   Specimen  Description BLOOD LEFT HAND  Final   Special Requests   Final    BOTTLES DRAWN AEROBIC AND ANAEROBIC Blood Culture results may not be optimal due to an inadequate volume of blood received in culture bottles   Culture NO GROWTH < 24 HOURS  Final   Report Status PENDING  Incomplete  Culture, blood (Routine X 2) w Reflex to ID Panel     Status: None (Preliminary result)   Collection Time: 05/23/17  2:40 PM  Result Value Ref Range Status   Specimen Description BLOOD LEFT HAND  Final   Special Requests   Final    BOTTLES DRAWN AEROBIC AND ANAEROBIC Blood Culture adequate volume   Culture NO GROWTH < 24 HOURS  Final   Report Status PENDING  Incomplete     Radiological Exams on Admission: Dg Chest 2 View  Result Date: 06/09/2017 CLINICAL DATA:  31 year old female with hemoptysis. Markedly limited evaluation due to positioning, the best possible images were obtained. EXAM: CHEST  2 VIEW COMPARISON:  05/23/2017 and priors FINDINGS: There is severe, chronic thoracolumbar scoliosis with thoracic hardware intact. The cardiomediastinal appearance is stable. Confluent opacity in the right mid lung is again identified and appear similar to most recent comparison examination. The appearance of the left lung is also grossly unchanged. No sizable pleural effusions are definite pneumothorax. Stable visualized osseous appearance. Markedly gaseous distension of bowel loops again partially visualized. IMPRESSION: Grossly unchanged appearance of the chest from most recent comparison examination. PE focal opacity in the right mid lung is again noted. Electronically Signed   By: Kristopher Oppenheim M.D.   On: 06/04/2017 15:59   Dg Chest Port 1 View  Result Date: 05/23/2017 CLINICAL DATA:  Productive cough and bloody sputum or vomiting is during suctioning this morning. History of infantile cerebral palsy, asthma. EXAM: PORTABLE CHEST 1 VIEW COMPARISON:  Chest x-ray of August 06, 2016 FINDINGS: The lungs are adequately  inflated. There is patchy increased density in the right perihilar region which is new. The interstitial markings within the left lung are coarse but stable. The heart is not enlarged. The pulmonary vascularity is not engorged. Severe dextroscoliosis is stable. A ventriculo atrial shunt tube is present but cannot be follow up below approximately T4. IMPRESSION: Subtle increased density in the right perihilar region may reflect developing pneumonia or an area of atelectasis. The examination is otherwise stable. The ventricular shunt tube cannot be followed on this study below the upper thorax. Electronically Signed  By: David  Martinique M.D.   On: 05/23/2017 10:54   Assessment/Plan Acute hypoxic respiratory failure:  - Admit to SDU and give O2 prn. Confirmed DNR.   Recurrent aspiration pneumonia:  - Broaden abx with vanc/zosyn to include anaerobic coverage for aspiration.  - Blood cultures no growth from 11/1. Repeated in ED. Giving 1L NS.  - Admit for worsening hypoxia, apparently now needs a mask in ED. Admit to SDU.  - Continue ranitidine  Asthma: No wheezing.  - Continue brovana, pulmicort and prn nebs.   Hemoptysis: Acute, most likely related to RLL infiltrate/pneumonia. D-dimer wnl, no granulomas on CT. Hgb stable, >15mg /dl, and hemodynamically stable in ED. Discussed with the patient's pulmonologist, Dr. Halford Chessman, who concurs with CT chest for better definition on infiltrate, but does not believe this patient would be a candidate for bronchoscopy. Checking PT/INR.   Cerebral palsy:  - Continue home medications: risperdal, baclofen, valium, primidone, norco   Vance Gather, MD [facility] Pager [phone]

## 2017-05-25 LAB — CBC
HCT: 43.4 % (ref 36.0–46.0)
Hemoglobin: 14 g/dL (ref 12.0–15.0)
MCH: 33.4 pg (ref 26.0–34.0)
MCHC: 32.3 g/dL (ref 30.0–36.0)
MCV: 103.6 fL — ABNORMAL HIGH (ref 78.0–100.0)
Platelets: 207 10*3/uL (ref 150–400)
RBC: 4.19 MIL/uL (ref 3.87–5.11)
RDW: 13.8 % (ref 11.5–15.5)
WBC: 10.1 10*3/uL (ref 4.0–10.5)

## 2017-05-25 LAB — BASIC METABOLIC PANEL
ANION GAP: 11 (ref 5–15)
BUN: 10 mg/dL (ref 6–20)
CHLORIDE: 104 mmol/L (ref 101–111)
CO2: 26 mmol/L (ref 22–32)
Calcium: 8.3 mg/dL — ABNORMAL LOW (ref 8.9–10.3)
Creatinine, Ser: <0.3 mg/dL — ABNORMAL LOW (ref 0.44–1.00)
Glucose, Bld: 77 mg/dL (ref 65–99)
POTASSIUM: 3.3 mmol/L — AB (ref 3.5–5.1)
SODIUM: 141 mmol/L (ref 135–145)

## 2017-05-25 LAB — MRSA PCR SCREENING: MRSA BY PCR: NEGATIVE

## 2017-05-25 IMAGING — DX DG FEMUR 2+V*L*
2 series · 2 of 2 positions shown · non-contrast
Comparison: Hip radiographs dated 03/02/2015

CLINICAL DATA: Left leg pain.

EXAM:
LEFT FEMUR 2 VIEWS

[femur ap (1 of 2)]
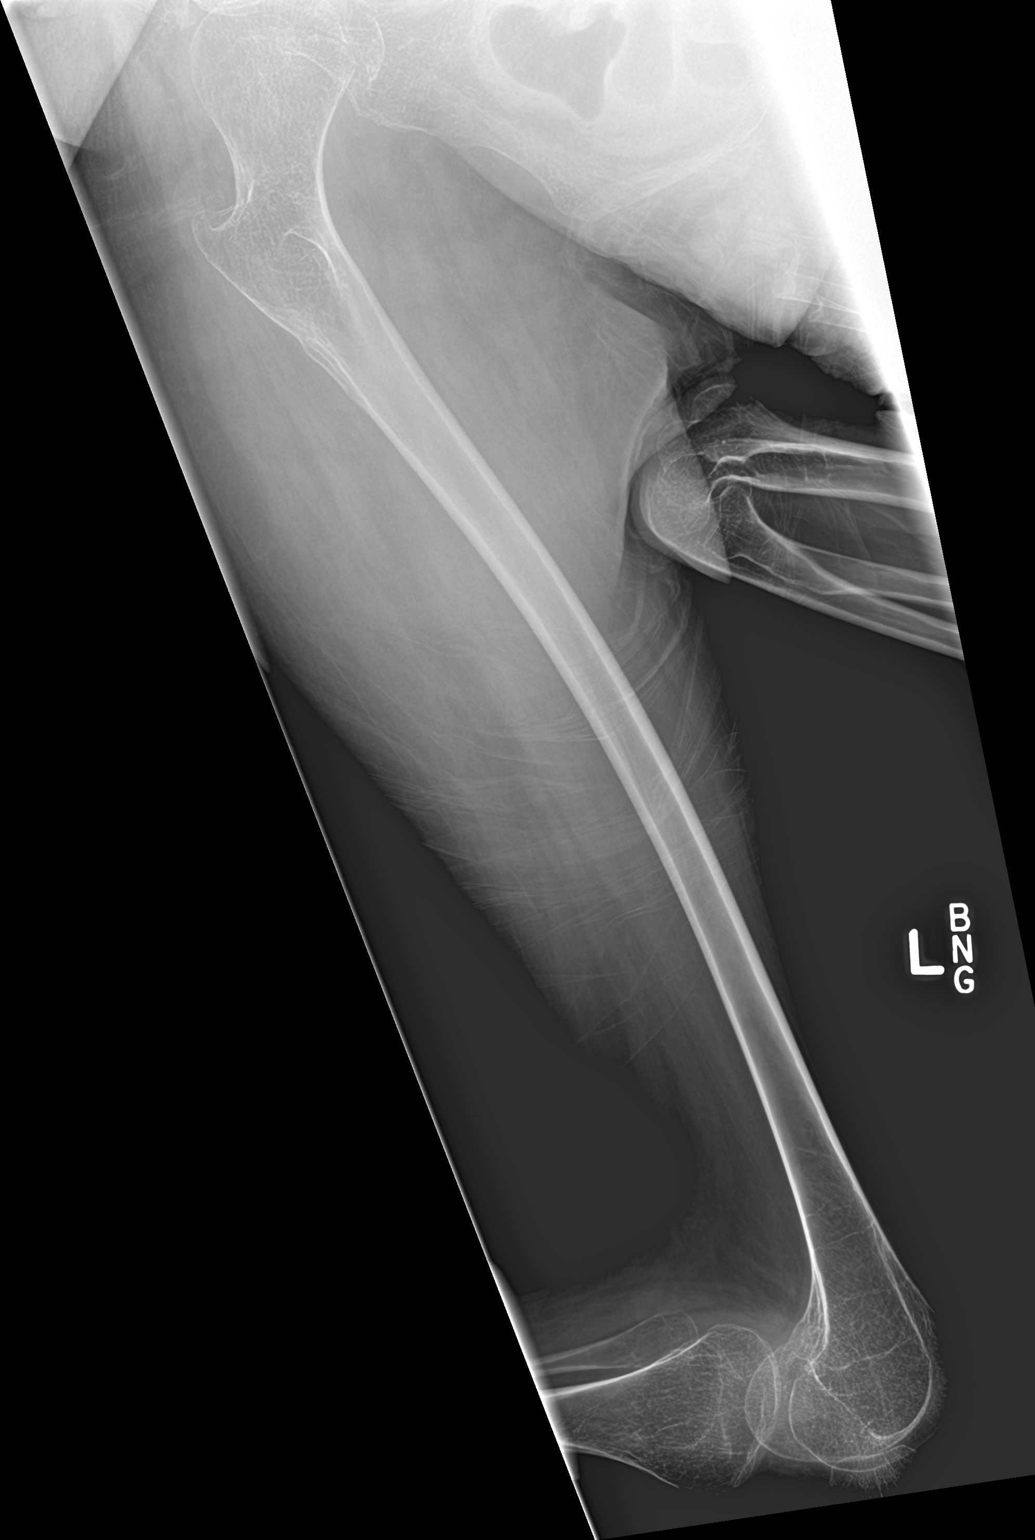

[femur ap (2 of 2)]
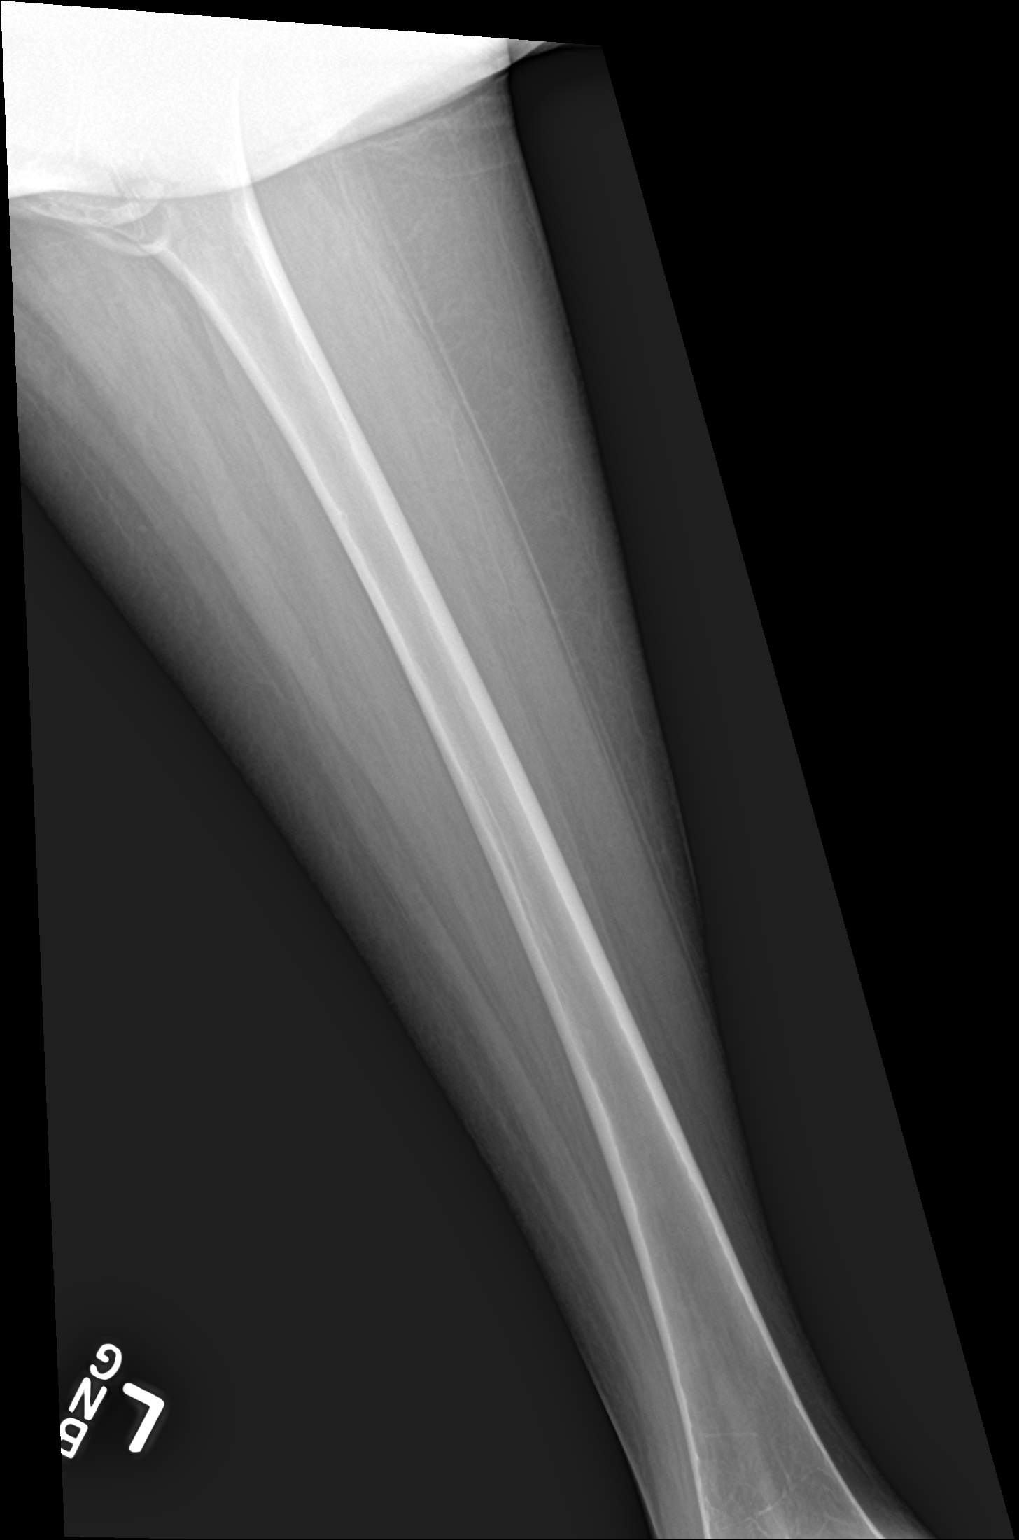

[2 of 2 positions shown; findings below may reference images not displayed]

FINDINGS: There is severe osteopenia. The bones are quite gracile. No fracture
or bone destruction. AP view of the distal femur could not be
obtained due to patient's condition.
IMPRESSION: No acute abnormalities.

## 2017-05-25 MED ORDER — METRONIDAZOLE IN NACL 5-0.79 MG/ML-% IV SOLN
500.0000 mg | Freq: Three times a day (TID) | INTRAVENOUS | Status: DC
Start: 2017-05-25 — End: 2017-06-01
  Administered 2017-05-25 – 2017-05-31 (×18): 500 mg via INTRAVENOUS
  Filled 2017-05-25 (×22): qty 100

## 2017-05-25 MED ORDER — SODIUM CHLORIDE 0.9 % IV BOLUS (SEPSIS)
500.0000 mL | Freq: Once | INTRAVENOUS | Status: AC
  Administered 2017-05-25: 500 mL via INTRAVENOUS

## 2017-05-25 MED ORDER — POTASSIUM CHLORIDE 2 MEQ/ML IV SOLN: INTRAVENOUS | Status: DC

## 2017-05-25 MED ORDER — KCL IN DEXTROSE-NACL 10-5-0.45 MEQ/L-%-% IV SOLN
INTRAVENOUS | Status: DC
  Administered 2017-05-25 – 2017-05-26 (×2): via INTRAVENOUS
  Filled 2017-05-25 (×2): qty 1000

## 2017-05-25 MED ORDER — LEVALBUTEROL HCL 0.63 MG/3ML IN NEBU
0.6300 mg | INHALATION_SOLUTION | RESPIRATORY_TRACT | Status: DC | PRN
  Administered 2017-05-27 – 2017-05-29 (×2): 0.63 mg via RESPIRATORY_TRACT
  Filled 2017-05-25 (×2): qty 3

## 2017-05-25 MED ORDER — LORAZEPAM 2 MG/ML IJ SOLN
0.5000 mg | INTRAMUSCULAR | Status: DC | PRN
  Administered 2017-05-25 – 2017-05-31 (×14): 0.5 mg via INTRAVENOUS
  Filled 2017-05-25 (×14): qty 1

## 2017-05-25 MED ORDER — VANCOMYCIN HCL 500 MG IV SOLR
500.0000 mg | Freq: Two times a day (BID) | INTRAVENOUS | Status: DC
  Administered 2017-05-25 – 2017-05-26 (×3): 500 mg via INTRAVENOUS
  Filled 2017-05-25 (×4): qty 500

## 2017-05-25 MED ORDER — LEVALBUTEROL HCL 0.63 MG/3ML IN NEBU
0.6300 mg | INHALATION_SOLUTION | Freq: Three times a day (TID) | RESPIRATORY_TRACT | Status: DC
  Administered 2017-05-25 – 2017-05-31 (×17): 0.63 mg via RESPIRATORY_TRACT
  Filled 2017-05-25 (×19): qty 3

## 2017-05-25 MED ORDER — CEFEPIME HCL 1 G IJ SOLR
1.0000 g | Freq: Two times a day (BID) | INTRAMUSCULAR | Status: DC
  Administered 2017-05-25 – 2017-05-31 (×12): 1 g via INTRAVENOUS
  Filled 2017-05-25 (×15): qty 1

## 2017-05-25 NOTE — Progress Notes (Signed)
PROGRESS NOTE  Renee [name]  VQM:[id number] DOB: 02-Nov-1985 DOA: 06/10/2017 PCP: Sharilyn Sites, MD  Outpatient Specialists: Pulmonology, Dr. Halford Chessman Brief Narrative: Renee [name] is a 31 y.o. female with a history of CP, recurrent aspiration pneumonia s/p PEG who presents from home by EMS for hemoptysis. Her caretaker noted hemoptysis while she was providing routine airway suctioning today after overall stable breathing status since being discharged from the ED the prior day. She had been seen in the ED with cough, acute on chronic hypoxia, found to have an infiltrate on CXR, treated with IV abx x1 and discharged with scant hemoptysis noted, no leukocytosis, stable hgb, and normal BMP. Blood cultures were drawn, and she was given CTX/azithromycin. She was requiring oxygen but has this at home. On arrival at admission 11/2, she initially had no further hemoptysis and appeared to be compensating with stable CXR. Hemoptysis continued and [facility] called for consult regarding workup/treatment of hemoptysis. CT demonstrated infiltrate in superior segment of RLL with GGOs at the bases indicative of chronic aspiration and possible aspiration vs. mucus secretions in upper airways. With repeat episodes of hemoptysis she developed respiratory distress requiring ventimask and was admitted to the SDU.   Assessment & Plan: Active Problems:   Acute respiratory failure with hypoxia (HCC)  Acute hypoxic respiratory failure:  - Respiratory effort has worsened since admission concerning for ongoing hemoptysis/aspiration. Will check back later this PM and if worsening will discuss palliative care options with mother. Pt currently appears in no distress.  - Continue SDU level of care with respiratory therapy and supportive noninvasive measures. Confirmed DNR.   Recurrent aspiration pneumonia:  - Continue vancomycin, cefepime, flagyl for HCAP and aspiration coverage. MRSA PCR neg, so will consider DC vancomycin if improving over  next 24-48 hrs.  - Blood cultures no growth from 11/1. Repeated in ED.  - Continue ranitidine and hold tube feedings for now.  Asthma: No wheezing.  - Continue brovana, pulmicort and prn nebs.  - Holding steroids due to infection and no wheezing.   Hemoptysis: Acute, most likely related to RLL infiltrate/pneumonia. D-dimer wnl, no granulomas on CT. Hgb stable. PT/INR wnl. - Discussed at admission with the patient's pulmonologist, Dr. Halford Chessman, who does not believe this patient would be a candidate for bronchoscopy.   Cerebral palsy:  - Continue home medications: risperdal, baclofen, primidone, valium prn and norco prn.  Hypokalemia: Possibly from missing tube feedings. Since these will be put on hold, we will give maintenance IVF's with K.   DVT prophylaxis: SCDs Code Status: DNR/DNI confirmed with mother Family Communication: Mother at bedside Disposition Plan: Uncertain  Consultants:   Pulmonology, Dr. Halford Chessman, by phone 11/2  Procedures:   None  Antimicrobials:  Vancomycin/cefepime/metronidazole   Subjective: Pt nonverbal. Mother reports her work of breathing has increased over the past 12 hours, continuing to have cough with suctioning of phlegm and red blood, though no single large volume episodes.  Objective: Vitals:   05/25/17 0200 05/25/17 0400 05/25/17 0600 05/25/17 0800  BP: 116/66 (!) 114/56 (!) 109/56   Pulse: 74 (!) 106 (!) 110   Resp: 17 (!) 26 (!) 29   Temp:  98.1 F (36.7 C)  99.2 F (37.3 C)  TempSrc:  Axillary  Axillary  SpO2: 100% 98% 96%   Weight:      Height:       Gen: Frail, ill-appearing 31 y.o. female in no distress Pulm: Labored breathing with worsened diffuse rhonchi R > L without wheezing. On partial NRB with  high flow O2.  CV: Regular tachycardia. No murmur, rub, or gallop. No JVD, no pedal edema. GI: Abdomen soft, non-tender, non-distended. +BS with PEG site c/d/i. Ext: Warm. Spastic flexion contractures x4 with severely atrophied muscles  throughout Skin: No rashes, lesions or ulcers Neuro: Drowsy, nonverbal. Interacts with mother's touch but not cooperative with exam. Psych: UTD.  Data Reviewed: I have personally reviewed following labs and imaging studies  CBC:  Recent Labs Lab 05/23/17 1156 05/27/2017 1446 05/25/17 0351  WBC 8.1 9.4 10.1  NEUTROABS 5.5 6.4  --   HGB 15.0 15.6* 14.0  HCT 46.4* 46.5* 43.4  MCV 104.0* 102.0* 103.6*  PLT 265 306 527   Basic Metabolic Panel:  Recent Labs Lab 05/23/17 1156 05/25/17 0351  NA 139 141  K 3.9 3.3*  CL 96* 104  CO2 31 26  GLUCOSE 75 77  BUN 6 10  CREATININE <0.30* <0.30*  CALCIUM 9.2 8.3*   GFR: CrCl cannot be calculated (This lab value cannot be used to calculate CrCl because it is not a number: <0.30). Liver Function Tests: No results for input(s): AST, ALT, ALKPHOS, BILITOT, PROT, ALBUMIN in the last 168 hours. No results for input(s): LIPASE, AMYLASE in the last 168 hours. No results for input(s): AMMONIA in the last 168 hours. Coagulation Profile:  Recent Labs Lab 06/20/2017 2220  INR 1.13   Cardiac Enzymes: No results for input(s): CKTOTAL, CKMB, CKMBINDEX, TROPONINI in the last 168 hours. BNP (last 3 results) No results for input(s): PROBNP in the last 8760 hours. HbA1C: No results for input(s): HGBA1C in the last 72 hours. CBG: No results for input(s): GLUCAP in the last 168 hours. Lipid Profile: No results for input(s): CHOL, HDL, LDLCALC, TRIG, CHOLHDL, LDLDIRECT in the last 72 hours. Thyroid Function Tests: No results for input(s): TSH, T4TOTAL, FREET4, T3FREE, THYROIDAB in the last 72 hours. Anemia Panel: No results for input(s): VITAMINB12, FOLATE, FERRITIN, TIBC, IRON, RETICCTPCT in the last 72 hours. Urine analysis:    Component Value Date/Time   COLORURINE AMBER (A) 08/06/2016 1406   APPEARANCEUR CLOUDY (A) 08/06/2016 1406   LABSPEC 1.014 08/06/2016 1406   PHURINE 7.0 08/06/2016 1406   GLUCOSEU NEGATIVE 08/06/2016 1406   HGBUR  SMALL (A) 08/06/2016 1406   BILIRUBINUR NEGATIVE 08/06/2016 1406   [place] 08/06/2016 1406   PROTEINUR 30 (A) 08/06/2016 1406   UROBILINOGEN 1.0 04/26/2014 1529   NITRITE NEGATIVE 08/06/2016 1406   LEUKOCYTESUR LARGE (A) 08/06/2016 1406   Recent Results (from the past 240 hour(s))  Culture, blood (Routine X 2) w Reflex to ID Panel     Status: None (Preliminary result)   Collection Time: 05/23/17  2:40 PM  Result Value Ref Range Status   Specimen Description BLOOD LEFT HAND  Final   Special Requests   Final    BOTTLES DRAWN AEROBIC AND ANAEROBIC Blood Culture results may not be optimal due to an inadequate volume of blood received in culture bottles   Culture NO GROWTH 2 DAYS  Final   Report Status PENDING  Incomplete  Culture, blood (Routine X 2) w Reflex to ID Panel     Status: None (Preliminary result)   Collection Time: 05/23/17  2:40 PM  Result Value Ref Range Status   Specimen Description BLOOD LEFT HAND  Final   Special Requests   Final    BOTTLES DRAWN AEROBIC AND ANAEROBIC Blood Culture adequate volume   Culture NO GROWTH 2 DAYS  Final   Report Status PENDING  Incomplete  MRSA PCR Screening     Status: None   Collection Time: 05/25/2017  9:55 PM  Result Value Ref Range Status   MRSA by PCR NEGATIVE NEGATIVE Final    Comment:        The GeneXpert MRSA Assay (FDA approved for NASAL specimens only), is one component of a comprehensive MRSA colonization surveillance program. It is not intended to diagnose MRSA infection nor to guide or monitor treatment for MRSA infections.       Radiology Studies: Dg Chest 2 View  Result Date: 05/29/2017 CLINICAL DATA:  31 year old female with hemoptysis. Markedly limited evaluation due to positioning, the best possible images were obtained. EXAM: CHEST  2 VIEW COMPARISON:  05/23/2017 and priors FINDINGS: There is severe, chronic thoracolumbar scoliosis with thoracic hardware intact. The cardiomediastinal appearance is  stable. Confluent opacity in the right mid lung is again identified and appear similar to most recent comparison examination. The appearance of the left lung is also grossly unchanged. No sizable pleural effusions are definite pneumothorax. Stable visualized osseous appearance. Markedly gaseous distension of bowel loops again partially visualized. IMPRESSION: Grossly unchanged appearance of the chest from most recent comparison examination. PE focal opacity in the right mid lung is again noted. Electronically Signed   By: Kristopher Oppenheim M.D.   On: 06/12/2017 15:59   Ct Chest W Contrast  Result Date: 06/14/2017 CLINICAL DATA:  31 year old female with hemoptysis. History of cerebral palsy and recent diagnosis of pneumonia. EXAM: CT CHEST WITH CONTRAST TECHNIQUE: Multidetector CT imaging of the chest was performed during intravenous contrast administration. CONTRAST:  79mL ISOVUE-300 IOPAMIDOL (ISOVUE-300) INJECTION 61% COMPARISON:  Chest radiograph dated 05/28/2017 FINDINGS: Evaluation is limited due to streak artifact caused by spinal Harrington rods. Cardiovascular: There is no cardiomegaly or pericardial effusion. The thoracic aorta is unremarkable. The origins of the great vessels of the aortic arch appear patent. There is mild dilatation of the main pulmonary trunk suggestive of underlying pulmonary hypertension. Evaluation of the pulmonary arteries is very limited due to streak artifact and suboptimal opacification of the distal branches. No large central pulmonary artery embolus identified in the pulmonary trunk or main pulmonary arteries. Mediastinum/Nodes: Evaluation of the hila is limited due to consolidative changes of the adjacent lungs. No large mediastinal lymph nodes. The esophagus is slightly thickened which may be reactive to recurrent reflux. No large mediastinal fluid collection. Lungs/Pleura: There is consolidative changes of the superior segment of the right lower lobe. There is diffuse  nodular and ground-glass density in the lower lobes bilaterally. Prominent patchy density in the left hilum may represent atelectasis versus infiltration or enlarged lymph node. There is no pleural effusion or pneumothorax. Debris noted in the upper trachea concerning for aspiration. Upper Abdomen: The liver is enlarged. The visualized upper abdomen is grossly unremarkable. Musculoskeletal: Scoliosis with posterior spinal Harrington rods. The bones are osteopenic. No acute osseous pathology. A VP shunt is partially visualized along the right anterior chest wall. IMPRESSION: 1. Very limited, almost nondiagnostic study for pulmonary embolism. No definite large central pulmonary artery embolus identified. 2. Consolidative changes of the superior segment of the right lower lobe as well as bilateral lower lobe ground-glass nodular densities most consistent with pneumonia. Findings may be related to aspiration. Clinical correlation is recommended. 3. The pre in the upper trachea may represent mucus secretion but is concerning for aspiration. 4. Scoliosis. Electronically Signed   By: Anner Crete M.D.   On: 06/18/2017 19:51   Dg Chest [facility] 1 View  Result  Date: 05/23/2017 CLINICAL DATA:  Productive cough and bloody sputum or vomiting is during suctioning this morning. History of infantile cerebral palsy, asthma. EXAM: PORTABLE CHEST 1 VIEW COMPARISON:  Chest x-ray of August 06, 2016 FINDINGS: The lungs are adequately inflated. There is patchy increased density in the right perihilar region which is new. The interstitial markings within the left lung are coarse but stable. The heart is not enlarged. The pulmonary vascularity is not engorged. Severe dextroscoliosis is stable. A ventriculo atrial shunt tube is present but cannot be follow up below approximately T4. IMPRESSION: Subtle increased density in the right perihilar region may reflect developing pneumonia or an area of atelectasis. The examination is otherwise  stable. The ventricular shunt tube cannot be followed on this study below the upper thorax. Electronically Signed   By: David  Martinique M.D.   On: 05/23/2017 10:54    Scheduled Meds: . albuterol  2.5 mg Nebulization TID  . arformoterol  15 mcg Nebulization BID  . baclofen  20 mg Per Tube QID  . budesonide  0.25 mg Nebulization BID  . feeding supplement (JEVITY 1.5 CAL/FIBER)  237 mL Per Tube QID  . metoCLOPramide  5 mg Per Tube TID AC  . primidone  200 mg Per Tube Q24H  . primidone  250 mg Per Tube BID  . ranitidine  150 mg Per Tube BID  . [name]  1.5 mg Per Tube TID  . sodium chloride flush  3 mL Intravenous Q12H   Continuous Infusions: . ceFEPime (MAXIPIME) IV    . metronidazole    . vancomycin       LOS: 1 day   Time spent: 35 minutes.  Vance Gather, MD [facility] Pager [phone]  If 7PM-7AM, please contact night-coverage [email or web address] Password [email or web address] 05/25/2017, 9:18 AM

## 2017-05-25 NOTE — Progress Notes (Signed)
PROGRESS NOTE  Checking back on Ms. Rolfson this afternoon, her work of breathing remains elevated but is stable compared to this morning. BP trending downward and HR sustaining >120. Discussed with caretaker at bedside who states breathing treatment offered improvement. On my repeat evaluation she still has some retractions, breathing ~30/min with improved aeration diffusely, still rhonchorous with upper airway transmitted sounds. No wheezing or crackles. No edema.   She has remained tachypneic with sepsis. Received 1L NS in ED yesterday and started MIVF this AM. Suspect she is still a bit dry.  - Will give 500cc NS x1 now and monitor HR, BP. - Change albuterol > xopenex given sustained sinus tachycardia  - Continue to monitor closely, though the patient is DNR/DNI.   Vance Gather, MD [facility] Pager [phone] 05/25/2017, 3:53 PM

## 2017-05-26 ENCOUNTER — Other Ambulatory Visit: Payer: Self-pay

## 2017-05-26 LAB — CBC
HEMATOCRIT: 42.9 % (ref 36.0–46.0)
Hemoglobin: 13.7 g/dL (ref 12.0–15.0)
MCH: 33.8 pg (ref 26.0–34.0)
MCHC: 31.9 g/dL (ref 30.0–36.0)
MCV: 105.9 fL — ABNORMAL HIGH (ref 78.0–100.0)
Platelets: 237 10*3/uL (ref 150–400)
RBC: 4.05 MIL/uL (ref 3.87–5.11)
RDW: 14.1 % (ref 11.5–15.5)
WBC: 9.1 10*3/uL (ref 4.0–10.5)

## 2017-05-26 LAB — BASIC METABOLIC PANEL
Anion gap: 8 (ref 5–15)
CALCIUM: 8.7 mg/dL — AB (ref 8.9–10.3)
CO2: 31 mmol/L (ref 22–32)
Chloride: 102 mmol/L (ref 101–111)
GLUCOSE: 98 mg/dL (ref 65–99)
Potassium: 3.1 mmol/L — ABNORMAL LOW (ref 3.5–5.1)
Sodium: 141 mmol/L (ref 135–145)

## 2017-05-26 MED ORDER — SODIUM CHLORIDE 0.9 % IV BOLUS (SEPSIS)
500.0000 mL | Freq: Once | INTRAVENOUS | Status: AC
  Administered 2017-05-26: 500 mL via INTRAVENOUS

## 2017-05-26 MED ORDER — POTASSIUM CHLORIDE 2 MEQ/ML IV SOLN
INTRAVENOUS | Status: DC
  Filled 2017-05-26: qty 1000

## 2017-05-26 MED ORDER — POTASSIUM CHLORIDE 2 MEQ/ML IV SOLN: INTRAVENOUS | Status: DC

## 2017-05-26 MED ORDER — KCL IN DEXTROSE-NACL 40-5-0.45 MEQ/L-%-% IV SOLN
INTRAVENOUS | Status: DC
  Administered 2017-05-26: 18:00:00 via INTRAVENOUS
  Filled 2017-05-26 (×2): qty 1000

## 2017-05-26 NOTE — Progress Notes (Signed)
PROGRESS NOTE  Renee [name]  IHK:[id number] DOB: 12-11-1985 DOA: 06/17/2017 PCP: Sharilyn Sites, MD  Outpatient Specialists: Pulmonology, Dr. Halford Chessman Brief Narrative: Renee [name] is a 31 y.o. female with a history of CP, recurrent aspiration pneumonia s/p PEG who presents from home by EMS for hemoptysis. Her caretaker noted hemoptysis while she was providing routine airway suctioning today after overall stable breathing status since being discharged from the ED the prior day. She had been seen in the ED with cough, acute on chronic hypoxia, found to have an infiltrate on CXR, treated with IV abx x1 and discharged with scant hemoptysis noted, no leukocytosis, stable hgb, and normal BMP. Blood cultures were drawn, and she was given CTX/azithromycin. She was requiring oxygen but has this at home. On arrival at admission 11/2, she initially had no further hemoptysis and appeared to be compensating with stable CXR. Hemoptysis continued and TRH called for consult regarding workup/treatment of hemoptysis. CT demonstrated infiltrate in superior segment of RLL with GGOs at the bases indicative of chronic aspiration and possible aspiration vs. mucus secretions in upper airways. With repeat episodes of hemoptysis she developed respiratory distress requiring ventimask and was admitted to the SDU.   Assessment & Plan: Active Problems:   Acute respiratory failure with hypoxia (HCC)  Acute hypoxic respiratory failure:  - Respiratory effort improved, still requiring significant supplemental oxygen. Will wean as able.  - Continue SDU level of care with respiratory therapy and supportive noninvasive measures. Confirmed DNR.   Sepsis due to recurrent aspiration pneumonia of right lower lobe:  - DC vancomycin as she's improving. MRSA PCR negative.  - Continue cefepime, flagyl - Blood cultures no growth from 11/1. Repeated in ED, all NGTD.  - Continue ranitidine and hold tube feedings for now. Will need to discuss  reinitiation if clinical status continues improvement.  - Continue IVF's, will increase rate given likely increased insensible losses.   Asthma: No wheezing.  - Continue brovana, pulmicort and prn nebs.  - Holding steroids due to infection and no wheezing.   Hemoptysis: Acute, improving. Most likely related to RLL infiltrate/pneumonia. D-dimer wnl, no granulomas on CT. Hgb stable. PT/INR wnl. - Discussed at admission with the patient's pulmonologist, Dr. Halford Chessman, who does not believe this patient would be a candidate for bronchoscopy.   Cerebral palsy:  - Continue home medications: risperdal, baclofen, primidone, valium prn and norco prn. Also ordered IV benzodiazepine prn.   Hypokalemia: Possibly from missing tube feedings. Worsened despite gentle potassium repletion.  - Increase K in IVF's and recheck in AM with magnesium.   DVT prophylaxis: SCDs Code Status: DNR/DNI   Family Communication: Mother at bedside Disposition Plan: Continue SDU level of care.   Consultants:   Pulmonology, Dr. Halford Chessman, by phone 11/2  Procedures:   None  Antimicrobials:  Vancomycin 11/2 - 11/4  Cefepime 11/2 >>   Metronidazole 11/3 >>   Subjective: Patient is nonverbal. Mother at the bedside thinks she's looking better, more calm. Got some rest. Breathing easier. Cough is stable but hemoptysis is much decreased.   Objective: Vitals:   05/26/17 0959 05/26/17 1200 05/26/17 1222 05/26/17 1435  BP:  122/65    Pulse:  (!) 107 (!) 103   Resp:  19 18   Temp:  98.5 F (36.9 C)    TempSrc:  Axillary    SpO2: 96% (!) 87% 96% 94%  Weight:      Height:       Gen: Frail, ill-appearing 31 y.o. female in no distress  Pulm: Labored breathing with worsened diffuse rhonchi R > L without wheezing. On partial NRB with high flow O2.  CV: Regular tachycardia. No murmur, rub, or gallop. No JVD, no pedal edema. GI: Abdomen soft, non-tender, non-distended. +BS with PEG site c/d/i. Ext: Warm. Spastic flexion  contractures x4 with severely atrophied muscles throughout Skin: No rashes, lesions or ulcers Neuro: Drowsy, nonverbal. Interacts with mother's touch but not cooperative with exam. Psych: UTD.  Data Reviewed: I have personally reviewed following labs and imaging studies  CBC: Recent Labs  Lab 05/23/17 1156 06/12/2017 1446 05/25/17 0351 05/26/17 0907  WBC 8.1 9.4 10.1 9.1  NEUTROABS 5.5 6.4  --   --   HGB 15.0 15.6* 14.0 13.7  HCT 46.4* 46.5* 43.4 42.9  MCV 104.0* 102.0* 103.6* 105.9*  PLT 265 306 207 829   Basic Metabolic Panel: Recent Labs  Lab 05/23/17 1156 05/25/17 0351 05/26/17 0907  NA 139 141 141  K 3.9 3.3* 3.1*  CL 96* 104 102  CO2 31 26 31   GLUCOSE 75 77 98  BUN 6 10 <5*  CREATININE <0.30* <0.30* <0.30*  CALCIUM 9.2 8.3* 8.7*   Coagulation Profile: Recent Labs  Lab 06/14/2017 2220  INR 1.13   Recent Results (from the past 240 hour(s))  Culture, blood (Routine X 2) w Reflex to ID Panel     Status: None (Preliminary result)   Collection Time: 05/23/17  2:40 PM  Result Value Ref Range Status   Specimen Description BLOOD LEFT HAND  Final   Special Requests   Final    BOTTLES DRAWN AEROBIC AND ANAEROBIC Blood Culture results may not be optimal due to an inadequate volume of blood received in culture bottles   Culture NO GROWTH 3 DAYS  Final   Report Status PENDING  Incomplete  Culture, blood (Routine X 2) w Reflex to ID Panel     Status: None (Preliminary result)   Collection Time: 05/23/17  2:40 PM  Result Value Ref Range Status   Specimen Description BLOOD LEFT HAND  Final   Special Requests   Final    BOTTLES DRAWN AEROBIC AND ANAEROBIC Blood Culture adequate volume   Culture NO GROWTH 3 DAYS  Final   Report Status PENDING  Incomplete  MRSA PCR Screening     Status: None   Collection Time: 05/31/2017  9:55 PM  Result Value Ref Range Status   MRSA by PCR NEGATIVE NEGATIVE Final    Comment:        The GeneXpert MRSA Assay (FDA approved for NASAL  specimens only), is one component of a comprehensive MRSA colonization surveillance program. It is not intended to diagnose MRSA infection nor to guide or monitor treatment for MRSA infections.       Radiology Studies: Dg Chest 2 View  Result Date: 06/14/2017 CLINICAL DATA:  31 year old female with hemoptysis. Markedly limited evaluation due to positioning, the best possible images were obtained. EXAM: CHEST  2 VIEW COMPARISON:  05/23/2017 and priors FINDINGS: There is severe, chronic thoracolumbar scoliosis with thoracic hardware intact. The cardiomediastinal appearance is stable. Confluent opacity in the right mid lung is again identified and appear similar to most recent comparison examination. The appearance of the left lung is also grossly unchanged. No sizable pleural effusions are definite pneumothorax. Stable visualized osseous appearance. Markedly gaseous distension of bowel loops again partially visualized. IMPRESSION: Grossly unchanged appearance of the chest from most recent comparison examination. PE focal opacity in the right mid lung is again noted. Electronically  Signed   By: Kristopher Oppenheim M.D.   On: 05/28/2017 15:59   Ct Chest W Contrast  Result Date: 05/31/2017 CLINICAL DATA:  31 year old female with hemoptysis. History of cerebral palsy and recent diagnosis of pneumonia. EXAM: CT CHEST WITH CONTRAST TECHNIQUE: Multidetector CT imaging of the chest was performed during intravenous contrast administration. CONTRAST:  54mL ISOVUE-300 IOPAMIDOL (ISOVUE-300) INJECTION 61% COMPARISON:  Chest radiograph dated 06/19/2017 FINDINGS: Evaluation is limited due to streak artifact caused by spinal Harrington rods. Cardiovascular: There is no cardiomegaly or pericardial effusion. The thoracic aorta is unremarkable. The origins of the great vessels of the aortic arch appear patent. There is mild dilatation of the main pulmonary trunk suggestive of underlying pulmonary hypertension. Evaluation  of the pulmonary arteries is very limited due to streak artifact and suboptimal opacification of the distal branches. No large central pulmonary artery embolus identified in the pulmonary trunk or main pulmonary arteries. Mediastinum/Nodes: Evaluation of the hila is limited due to consolidative changes of the adjacent lungs. No large mediastinal lymph nodes. The esophagus is slightly thickened which may be reactive to recurrent reflux. No large mediastinal fluid collection. Lungs/Pleura: There is consolidative changes of the superior segment of the right lower lobe. There is diffuse nodular and ground-glass density in the lower lobes bilaterally. Prominent patchy density in the left hilum may represent atelectasis versus infiltration or enlarged lymph node. There is no pleural effusion or pneumothorax. Debris noted in the upper trachea concerning for aspiration. Upper Abdomen: The liver is enlarged. The visualized upper abdomen is grossly unremarkable. Musculoskeletal: Scoliosis with posterior spinal Harrington rods. The bones are osteopenic. No acute osseous pathology. A VP shunt is partially visualized along the right anterior chest wall. IMPRESSION: 1. Very limited, almost nondiagnostic study for pulmonary embolism. No definite large central pulmonary artery embolus identified. 2. Consolidative changes of the superior segment of the right lower lobe as well as bilateral lower lobe ground-glass nodular densities most consistent with pneumonia. Findings may be related to aspiration. Clinical correlation is recommended. 3. The pre in the upper trachea may represent mucus secretion but is concerning for aspiration. 4. Scoliosis. Electronically Signed   By: Anner Crete M.D.   On: 05/27/2017 19:51    Scheduled Meds: . arformoterol  15 mcg Nebulization BID  . baclofen  20 mg Per Tube QID  . budesonide  0.25 mg Nebulization BID  . levalbuterol  0.63 mg Nebulization TID  . primidone  200 mg Per Tube Q24H  .  primidone  250 mg Per Tube BID  . ranitidine  150 mg Per Tube BID  . [name]  1.5 mg Per Tube TID  . sodium chloride flush  3 mL Intravenous Q12H   Continuous Infusions: . ceFEPime (MAXIPIME) IV Stopped (05/26/17 0946)  . dextrose 5 % and 0.45 % NaCl with KCl 10 mEq/L 65 mL/hr at 05/26/17 0515  . metronidazole 500 mg (05/26/17 1512)  . vancomycin Stopped (05/26/17 1016)     LOS: 2 days   Time spent: 35 minutes.  Vance Gather, MD [facility] Pager [phone]  If 7PM-7AM, please contact night-coverage [email or web address] Password [email or web address] 05/26/2017, 3:29 PM

## 2017-05-27 LAB — BASIC METABOLIC PANEL
ANION GAP: 7 (ref 5–15)
BUN: <5 mg/dL — ABNORMAL LOW (ref 6–20)
CALCIUM: 8.3 mg/dL — AB (ref 8.9–10.3)
CO2: 31 mmol/L (ref 22–32)
Chloride: 104 mmol/L (ref 101–111)
Creatinine, Ser: <0.3 mg/dL — ABNORMAL LOW (ref 0.44–1.00)
Glucose, Bld: 124 mg/dL — ABNORMAL HIGH (ref 65–99)
Potassium: 4.1 mmol/L (ref 3.5–5.1)
SODIUM: 142 mmol/L (ref 135–145)

## 2017-05-27 LAB — CBC
HCT: 40 % (ref 36.0–46.0)
HEMOGLOBIN: 13.2 g/dL (ref 12.0–15.0)
MCH: 34.6 pg — AB (ref 26.0–34.0)
MCHC: 33 g/dL (ref 30.0–36.0)
MCV: 104.7 fL — ABNORMAL HIGH (ref 78.0–100.0)
Platelets: 237 10*3/uL (ref 150–400)
RBC: 3.82 MIL/uL — AB (ref 3.87–5.11)
RDW: 14 % (ref 11.5–15.5)
WBC: 6 10*3/uL (ref 4.0–10.5)

## 2017-05-27 LAB — MAGNESIUM: MAGNESIUM: 1.8 mg/dL (ref 1.7–2.4)

## 2017-05-27 MED ORDER — JEVITY 1.5 CAL/FIBER PO LIQD
60.0000 mL | ORAL | Status: DC
  Administered 2017-05-27 – 2017-05-31 (×29): 60 mL
  Filled 2017-05-27 (×12): qty 1000

## 2017-05-27 MED ORDER — JEVITY 1.2 CAL PO LIQD
60.0000 mL | ORAL | Status: DC
  Administered 2017-05-27 (×2): 60 mL

## 2017-05-27 MED ORDER — KCL IN DEXTROSE-NACL 20-5-0.45 MEQ/L-%-% IV SOLN
INTRAVENOUS | Status: DC
  Administered 2017-05-27: 07:00:00 via INTRAVENOUS
  Administered 2017-05-27: 65 mL via INTRAVENOUS
  Administered 2017-05-28 – 2017-05-31 (×3): via INTRAVENOUS
  Administered 2017-05-31: 65 mL/h via INTRAVENOUS
  Filled 2017-05-27 (×7): qty 1000

## 2017-05-27 MED ORDER — POTASSIUM CHLORIDE 2 MEQ/ML IV SOLN: INTRAVENOUS | Status: DC

## 2017-05-27 NOTE — Progress Notes (Signed)
Initial Nutrition Assessment  DOCUMENTATION CODES:   Underweight  INTERVENTION:    Increase TF of Jevity 1.5 to goal rate of 240 mL (8 ounces) via PEG 4 times daily  Providing 1422 kcal, 60 grams protein and 720 mL of H2O  If unable to advance to this rate, recommend initiation of continuous feedings.   NUTRITION DIAGNOSIS:   Increased nutrient needs related to chronic illness(CP) as evidenced by estimated needs.  GOAL:   Patient will meet greater than or equal to 90% of their needs  MONITOR:   TF tolerance, I & O's, Weight trends  REASON FOR ASSESSMENT:   Consult Enteral/tube feeding initiation and management  ASSESSMENT:   Pt with PMH of CP, s/p PEG on home TF, seizure disorder, osteopenia, and scoliosis presents with aspiration pneumonia found hemoptysis   Pt unable to answer RD questions at visit, spoke with mother and caretaker at bedside.  Pt's mother reports pt has been on home TF regimen of 6 ounces Vital 1.5  with 2 ounces free water 4 times/day. Per mother, pt sometimes tolerates full feeding sometimes needs to reduce amount given. Per caretaker, occasionally will try and add a feeding to get rest of ounces in. If unable to advance to this rate, recommended initiation of continuous feedings to promote tolerance.   Per caretaker, pt has tolerated 4 ounces Vital 1.5  by 1200 PM today.   Pt's mother reports pt is down ~ 5 lbs in the past couple months and a UBW of 60 lbs.    Pt's mother reports occasional constipation and utilizing Miralax at baseline. Pt likely not receiving enough fluid from current regimen.  Nutrition focused physical exam deferred at this time given pt's agitation, pt wheelchair/bed bound at baseline.   Labs reviewed Medications reviewed; Zantac, 65 mL/hr D5 and NaCl with KCl  Diet Order:  Diet NPO time specified  EDUCATION NEEDS:   No education needs have been identified at this time  Skin:  Skin Assessment: Reviewed RN  Assessment  Last BM:  05/25/17  Height:   Ht Readings from Last 1 Encounters:  05/30/2017 4' (1.219 m)    Weight:   Wt Readings from Last 1 Encounters:  06/07/2017 55 lb 16 oz (25.4 kg)    Ideal Body Weight:  36 kg  BMI:  Body mass index is 17.09 kg/m.  Estimated Nutritional Needs:   Kcal:  0160-1093  Protein:  55-65 grams  Fluid:  >/= 1.2 L/d  Parks Ranger, MS, RDN, LDN 05/27/2017 1:20 PM

## 2017-05-27 NOTE — Care Management Note (Signed)
Case Management Note  Patient Details  Name: Deanza Upperman MRN: [id number] Date of Birth: 1985/11/02  Subjective/Objective:                  31 y.o.femalewith a history of CP, recurrent aspiration pneumonia s/p PEG who presents from home by EMS for hemoptysis. Her caretaker noted hemoptysis while she was providing routine airway suctioning today after overall stable breathing status since being discharged from the ED the prior day. She had been seen in the ED with cough, acute on chronic hypoxia, found to have an infiltrate on CXR, treated with IV abx x1 and discharged with scant hemoptysis noted, no leukocytosis, stable hgb, and normal BMP. Blood cultures were drawn, and she was given CTX/azithromycin. She was requiring oxygen but has this at home. On arrival at admission 11/2, she initially had no further hemoptysis and appeared to be compensating with stable CXR. Hemoptysis continued and TRH called for consult regarding workup/treatment of hemoptysis. CT demonstrated infiltrate in superior segment of RLL with GGOs at the bases indicative of chronic aspiration and possible aspiration vs. mucus secretions in upper airways. With repeat episodes of hemoptysis she developed respiratory distress requiring ventimask and was admitted to the SDU.     Action/Plan: Date:  May 27, 2017 Chart reviewed for concurrent status and case management needs.  Will continue to follow patient progress.  Discharge Planning: following for needs  Expected discharge date: May 30, 2017  Velva Harman, BSN, [place], [place]   Expected Discharge Date:  (UNKNOWN)               Expected Discharge Plan:  Home/Self Care  In-House Referral:  Clinical Social Work  Discharge planning Services  CM Consult  Post Acute Care Choice:    Choice offered to:     DME Arranged:    DME Agency:     HH Arranged:    [place] Agency:     Status of Service:  In process, will continue to follow  If discussed at Long Length of  Stay Meetings, dates discussed:    Additional Comments:  Leeroy Cha, RN 05/27/2017, 10:16 AM

## 2017-05-27 NOTE — Progress Notes (Signed)
Pt currently on NRB and tolerating well.  Pt appears comfortable at this time, BIPAP not needed.  RT to monitor and assess as needed.

## 2017-05-27 NOTE — Progress Notes (Signed)
Pharmacy Antibiotic Note  Renee [name] is a 31 y.o. female admitted on 06/13/2017 with pneumonia.  Pharmacy has been consulted for Vancomycin & Cefepime dosing.  Plan:  Continue Cefepime 1g IV q12 hr  Flagyl per MD, dosing appropriate  Height: 4' (121.9 cm) Weight: 55 lb 16 oz (25.4 kg) IBW/kg (Calculated) : 17.9  Temp (24hrs), Avg:98.6 F (37 C), Min:97.4 F (36.3 C), Max:99.3 F (37.4 C)  Recent Labs  Lab 05/23/17 1156 06/13/2017 1446 05/25/17 0351 05/26/17 0907 05/27/17 0400  WBC 8.1 9.4 10.1 9.1 6.0  CREATININE <0.30*  --  <0.30* <0.30* <0.30*    CrCl cannot be calculated (This lab value cannot be used to calculate CrCl because it is not a number: <0.30).    Allergies  Allergen Reactions  . Other     Paper and adhesive tape - blisters  . Penicillins Rash    Has patient had a PCN reaction causing immediate rash, facial/tongue/throat swelling, SOB or lightheadedness with hypotension:yes Has patient had a PCN reaction causing severe rash involving mucus membranes or skin necrosis: NO Has patient had a PCN reaction that required hospitalization: NO Has patient had a PCN reaction occurring within the last 10 years: NO If all of the above answers are "NO", then may proceed with Cephalosporin use.   . Latex Rash and Other (See Comments)    blisters   Antimicrobials this admission: 11/2 Rocephin/Azithromycin  >> x1 ED 11/2 Cefepime >>  11/2 Vancomycin >> 11/4 11/3 metronidazole >>    Dose adjustments this admission:   Microbiology results: 11/1 Bcx NGTD  11/2 BCx: NGTD 11/2 MRSA PCR: negative   Thank you for allowing pharmacy to be a part of this patient's care.  Reuel Boom, PharmD, BCPS [phone] 05/27/2017, 3:03 PM

## 2017-05-27 NOTE — Progress Notes (Signed)
PROGRESS NOTE  Melda Mermelstein  YPP:[id number] DOB: 1986-07-05 DOA: 06/04/2017 PCP: Sharilyn Sites, MD  Outpatient Specialists: Pulmonology, Dr. Halford Chessman Brief Narrative: Renee [name] is a 31 y.o. female with a history of CP, recurrent aspiration pneumonia s/p PEG who presents from home by EMS for hemoptysis. Her caretaker noted hemoptysis while she was providing routine airway suctioning today after overall stable breathing status since being discharged from the ED the prior day. She had been seen in the ED with cough, acute on chronic hypoxia, found to have an infiltrate on CXR, treated with IV abx x1 and discharged with scant hemoptysis noted, no leukocytosis, stable hgb, and normal BMP. Blood cultures were drawn, and she was given CTX/azithromycin. She was requiring oxygen but has this at home. On arrival at admission 11/2, she initially had no further hemoptysis and appeared to be compensating with stable CXR. Hemoptysis continued and TRH called for consult regarding workup/treatment of hemoptysis. CT demonstrated infiltrate in superior segment of RLL with GGOs at the bases indicative of chronic aspiration and possible aspiration vs. mucus secretions in upper airways. With repeat episodes of hemoptysis she developed respiratory distress requiring ventimask and was admitted to the SDU.   Assessment & Plan: Principal Problem:   Aspiration pneumonia ([place]) Active Problems:   Mild persistent asthma   Seizure disorder ([place])   Congenital quadriplegia (HCC)   Protein-calorie malnutrition, severe (HCC)   Sepsis due to pneumonia ([place])   Asthma   Acute respiratory failure with hypoxia (HCC)  Acute hypoxic respiratory failure:  - Respiratory effort improved, still requiring significant supplemental oxygen. Will wean as able.  - Continue SDU level of care with respiratory therapy and supportive noninvasive measures. Confirmed DNR.   Sepsis due to recurrent aspiration pneumonia of right lower lobe:  - Continue  cefepime, flagyl. - Blood cultures no growth from 11/1 and at admission..  - Continue ranitidine and restart low volume feeds. Appreciate nutrition consult. After discussion with her mother, feel risk of recurrent aspiration is worth need for nutrition. - Continue IVF  Asthma: No wheezing.  - Continue brovana, pulmicort and prn nebs.  - Holding steroids due to infection and no wheezing.   Hemoptysis: Acute, improving. Most likely related to RLL infiltrate/pneumonia. D-dimer wnl, no granulomas on CT. Hgb stable. PT/INR wnl. - Discussed at admission with the patient's pulmonologist, Dr. Halford Chessman, who does not believe this patient would be a candidate for bronchoscopy.   Cerebral palsy:  - Continue home medications: risperdal, baclofen, primidone, valium prn and norco prn. Also ordered IV benzodiazepine prn.   Hypokalemia: Possibly from missing tube feedings. Worsened despite gentle potassium repletion. Resolved.  - Monitor   DVT prophylaxis: SCDs Code Status: DNR/DNI   Family Communication: Mother at bedside Disposition Plan: Continue SDU level of care.   Consultants:   Pulmonology, Dr. Halford Chessman, by phone 11/2  Procedures:   None  Antimicrobials:  Vancomycin 11/2 - 11/4  Cefepime 11/2 >>   Metronidazole 11/3 >>   Subjective: Patient is nonverbal, RN reports having to increase O2 back to 15L after initially being stable on 12L last night. Mother at bedside concerned about starting tube feeding. Cough is stable and hemoptysis is greatly improved.  Objective: Vitals:   05/27/17 1000 05/27/17 1100 05/27/17 1200 05/27/17 1300  BP: (!) 105/52  107/73   Pulse: (!) 113 (!) 110 (!) 123 (!) 119  Resp: 20 (!) 25 17 (!) 24  Temp:      TempSrc:      SpO2: (!) 87%  96% 94% 97%  Weight:      Height:       Gen: Frail, underweight 31 y.o. female in no distress Pulm: Mildly labored with HFNC, rhonchi R > L posteriorly with significant transmitted upper airway sounds.  CV: Regular  tachycardia. No murmur, rub, or gallop. No JVD, no pedal edema. GI: Abdomen soft, non-tender, non-distended. +BS with PEG site c/d/i. Ext: Warm. Spastic flexion contractures x4 with severely atrophied muscles throughout Skin: No rashes, lesions or ulcers Neuro: Drowsy, nonverbal, makes eye contact occasionally but not meaningfully responsive or cooperative this AM Psych: UTD.  Data Reviewed: I have personally reviewed following labs and imaging studies  CBC: Recent Labs  Lab 05/23/17 1156 05/23/2017 1446 05/25/17 0351 05/26/17 0907 05/27/17 0400  WBC 8.1 9.4 10.1 9.1 6.0  NEUTROABS 5.5 6.4  --   --   --   HGB 15.0 15.6* 14.0 13.7 13.2  HCT 46.4* 46.5* 43.4 42.9 40.0  MCV 104.0* 102.0* 103.6* 105.9* 104.7*  PLT 265 306 207 237 062   Basic Metabolic Panel: Recent Labs  Lab 05/23/17 1156 05/25/17 0351 05/26/17 0907 05/27/17 0400  NA 139 141 141 142  K 3.9 3.3* 3.1* 4.1  CL 96* 104 102 104  CO2 31 26 31 31   GLUCOSE 75 77 98 124*  BUN 6 10 <5* <5*  CREATININE <0.30* <0.30* <0.30* <0.30*  CALCIUM 9.2 8.3* 8.7* 8.3*  MG  --   --   --  1.8   Coagulation Profile: Recent Labs  Lab 06/06/2017 2220  INR 1.13   Recent Results (from the past 240 hour(s))  Culture, blood (Routine X 2) w Reflex to ID Panel     Status: None (Preliminary result)   Collection Time: 05/23/17  2:40 PM  Result Value Ref Range Status   Specimen Description BLOOD LEFT HAND  Final   Special Requests   Final    BOTTLES DRAWN AEROBIC AND ANAEROBIC Blood Culture results may not be optimal due to an inadequate volume of blood received in culture bottles   Culture NO GROWTH 4 DAYS  Final   Report Status PENDING  Incomplete  Culture, blood (Routine X 2) w Reflex to ID Panel     Status: None (Preliminary result)   Collection Time: 05/23/17  2:40 PM  Result Value Ref Range Status   Specimen Description BLOOD LEFT HAND  Final   Special Requests   Final    BOTTLES DRAWN AEROBIC AND ANAEROBIC Blood Culture  adequate volume   Culture NO GROWTH 4 DAYS  Final   Report Status PENDING  Incomplete  MRSA PCR Screening     Status: None   Collection Time: 05/23/2017  9:55 PM  Result Value Ref Range Status   MRSA by PCR NEGATIVE NEGATIVE Final    Comment:        The GeneXpert MRSA Assay (FDA approved for NASAL specimens only), is one component of a comprehensive MRSA colonization surveillance program. It is not intended to diagnose MRSA infection nor to guide or monitor treatment for MRSA infections.   Blood culture (routine x 2)     Status: None (Preliminary result)   Collection Time: 06/05/2017 10:20 PM  Result Value Ref Range Status   Specimen Description BLOOD RIGHT ARM  Final   Special Requests IN PEDIATRIC BOTTLE Blood Culture adequate volume  Final   Culture   Final    NO GROWTH 2 DAYS Performed at [facility], [place] [address], [place], [place]  [address]    Report Status PENDING  Incomplete  Blood culture (routine x 2)     Status: None (Preliminary result)   Collection Time: 06/12/2017 10:20 PM  Result Value Ref Range Status   Specimen Description BLOOD LEFT HAND  Final   Special Requests IN PEDIATRIC BOTTLE Blood Culture adequate volume  Final   Culture   Final    NO GROWTH 2 DAYS Performed at [facility], [place] [address], [place], [place] [address]    Report Status PENDING  Incomplete      Radiology Studies: No results found.  Scheduled Meds: . arformoterol  15 mcg Nebulization BID  . baclofen  20 mg Per Tube QID  . budesonide  0.25 mg Nebulization BID  . feeding supplement (JEVITY 1.5 CAL/FIBER)  60 mL Per Tube Q2H  . levalbuterol  0.63 mg Nebulization TID  . primidone  200 mg Per Tube Q24H  . primidone  250 mg Per Tube BID  . ranitidine  150 mg Per Tube BID  . [name]  1.5 mg Per Tube TID  . sodium chloride flush  3 mL Intravenous Q12H   Continuous Infusions: . ceFEPime (MAXIPIME) IV Stopped (05/27/17 9211)  . dextrose 5 % and 0.45 % NaCl with KCl 20  mEq/L 65 mL/hr at 05/27/17 1300  . metronidazole Stopped (05/27/17 0907)     LOS: 3 days   Time spent: 35 minutes.  Vance Gather, MD [facility] Pager [phone]  If 7PM-7AM, please contact night-coverage [email or web address] Password [email or web address] 05/27/2017, 2:08 PM

## 2017-05-28 DIAGNOSIS — J4520 Mild intermittent asthma, uncomplicated: Secondary | ICD-10-CM

## 2017-05-28 DIAGNOSIS — J9601 Acute respiratory failure with hypoxia: Secondary | ICD-10-CM

## 2017-05-28 DIAGNOSIS — J690 Pneumonitis due to inhalation of food and vomit: Secondary | ICD-10-CM

## 2017-05-28 LAB — CBC
HEMATOCRIT: 38.3 % (ref 36.0–46.0)
Hemoglobin: 12.4 g/dL (ref 12.0–15.0)
MCH: 33.5 pg (ref 26.0–34.0)
MCHC: 32.4 g/dL (ref 30.0–36.0)
MCV: 103.5 fL — ABNORMAL HIGH (ref 78.0–100.0)
PLATELETS: 213 10*3/uL (ref 150–400)
RBC: 3.7 MIL/uL — AB (ref 3.87–5.11)
RDW: 13.6 % (ref 11.5–15.5)
WBC: 10.2 10*3/uL (ref 4.0–10.5)

## 2017-05-28 LAB — BASIC METABOLIC PANEL
Anion gap: 7 (ref 5–15)
BUN: 5 mg/dL — AB (ref 6–20)
CO2: 32 mmol/L (ref 22–32)
Calcium: 8.3 mg/dL — ABNORMAL LOW (ref 8.9–10.3)
Chloride: 102 mmol/L (ref 101–111)
Glucose, Bld: 112 mg/dL — ABNORMAL HIGH (ref 65–99)
POTASSIUM: 4 mmol/L (ref 3.5–5.1)
SODIUM: 141 mmol/L (ref 135–145)

## 2017-05-28 LAB — CULTURE, BLOOD (ROUTINE X 2)
CULTURE: NO GROWTH
Culture: NO GROWTH
Special Requests: ADEQUATE

## 2017-05-28 NOTE — Progress Notes (Addendum)
PROGRESS NOTE  Renee [name]  ZWC:[id number] DOB: 1985/08/24 DOA: 06/05/2017 PCP: Renee Sites, MD  Outpatient Specialists: Pulmonology, Dr. Halford Chessman Brief Narrative: Renee [name] is a 31 y.o. female with a history of CP, recurrent aspiration pneumonia s/p PEG who presents from home by EMS for hemoptysis. Her caretaker noted hemoptysis while she was providing routine airway suctioning today after overall stable breathing status since being discharged from the ED the prior day. She had been seen in the ED with cough, acute on chronic hypoxia, found to have an infiltrate on CXR, treated with IV abx x1 and discharged with scant hemoptysis noted, no leukocytosis, stable hgb, and normal BMP. Blood cultures were drawn, and she was given CTX/azithromycin. She was requiring oxygen but has this at home. On arrival at admission 11/2, she initially had no further hemoptysis and appeared to be compensating with stable CXR. Hemoptysis continued and TRH called for consult regarding workup/treatment of hemoptysis. CT demonstrated infiltrate in superior segment of RLL with GGOs at the bases indicative of chronic aspiration and possible aspiration vs. mucus secretions in upper airways. With repeat episodes of hemoptysis she developed respiratory distress requiring ventimask and was admitted to the SDU.  Assessment & Plan: Principal Problem:   Aspiration pneumonia ([place]) Active Problems:   Mild persistent asthma   Seizure disorder (HCC)   Congenital quadriplegia (HCC)   Protein-calorie malnutrition, severe (HCC)   Sepsis due to pneumonia ([place])   Asthma   Acute respiratory failure with hypoxia (HCC)  Acute hypoxic respiratory failure:  - Respiratory effort this morning remains elevated, still requiring significant supplemental oxygen.  - Pt followed by Dr. Roxan Hockey Pulmonology, and is not progressing. I will ask them to evaluate for any additional recommendations.  - Continue SDU level of care with respiratory therapy  and supportive noninvasive measures. Confirmed DNR.   Sepsis due to recurrent aspiration pneumonia of right lower lobe:  - Continue cefepime, flagyl. - Blood cultures no growth from 11/1 and at admission. Sputum culture sent, no result. - Continue ranitidine, restarted low volume tube feeds 11/5. Appreciate nutrition consult. - Continue maintenance IVF for today.  Asthma: No wheezing.  - Continue brovana, pulmicort and prn nebs.  - Holding steroids due to infection and no wheezing.   Hemoptysis: Acute, improving. Most likely related to RLL infiltrate/pneumonia. D-dimer wnl, no granulomas on CT. Hgb stable. PT/INR wnl. - Discussed at admission with the patient's pulmonologist, Dr. Halford Chessman, who does not believe this patient would be a candidate for bronchoscopy. Will follow up pulmonology recommendations - Hgb continues slow downward trend, though still not anemic. Will continue monitoring CBC daily.  Cerebral palsy:  - Continue home medications: risperdal, baclofen, primidone, valium prn and norco prn. Also ordered IV benzodiazepine prn.   Hypokalemia: Possibly from missing tube feedings. Resolved.   DVT prophylaxis: SCDs Code Status: DNR/DNI   Family Communication: Mother at bedside Disposition Plan: Continue SDU level of care.   Consultants:   Pulmonology, Dr. Halford Chessman, by phone 11/2  Pulmonology formal consult 11/6  Procedures:   None  Antimicrobials:  Vancomycin 11/2 - 11/4  Cefepime 11/2 >>   Metronidazole 11/3 >>   Subjective: Discussed with mother and RN at bedside today. Pt is nonverbal. Oxygen requirement remains 15L with increased work up breathing. She's been kept calm with IV medications, cough and hemoptysis improving.   Objective: Vitals:   05/28/17 0833 05/28/17 0900 05/28/17 0928 05/28/17 1000  BP:    (!) 94/53  Pulse:  (!) 112 (!) 117 (!) 108  Resp:  (!) 22 18 (!) 23  Temp: (!) 96.9 F (36.1 C)     TempSrc: Axillary     SpO2:  90% 94% 97%  Weight:       Height:       Gen: Frail, underweight 31 y.o. female in no distress Pulm: +Supraclavicular retractions, tachypneic, with HFNC, rhonchi R > L posteriorly with significant transmitted upper airway sounds.  CV: Regular tachycardia. No murmur, rub, or gallop. No JVD, no pedal edema. GI: Abdomen soft, non-tender, non-distended. +BS with PEG site c/d/i. Ext: Warm. Spastic flexion contractures x4 with severely decreased muscle bulk. Skin: No rashes, lesions or ulcers Neuro: Alert, nonverbal, makes eye contact occasionally Psych: UTD.  Data Reviewed: I have personally reviewed following labs and imaging studies  CBC: Recent Labs  Lab 05/23/17 1156 05/23/2017 1446 05/25/17 0351 05/26/17 0907 05/27/17 0400 05/28/17 0326  WBC 8.1 9.4 10.1 9.1 6.0 10.2  NEUTROABS 5.5 6.4  --   --   --   --   HGB 15.0 15.6* 14.0 13.7 13.2 12.4  HCT 46.4* 46.5* 43.4 42.9 40.0 38.3  MCV 104.0* 102.0* 103.6* 105.9* 104.7* 103.5*  PLT 265 306 207 237 237 841   Basic Metabolic Panel: Recent Labs  Lab 05/23/17 1156 05/25/17 0351 05/26/17 0907 05/27/17 0400 05/28/17 0326  NA 139 141 141 142 141  K 3.9 3.3* 3.1* 4.1 4.0  CL 96* 104 102 104 102  CO2 31 26 31 31  32  GLUCOSE 75 77 98 124* 112*  BUN 6 10 <5* <5* 5*  CREATININE <0.30* <0.30* <0.30* <0.30* <0.30*  CALCIUM 9.2 8.3* 8.7* 8.3* 8.3*  MG  --   --   --  1.8  --    Coagulation Profile: Recent Labs  Lab 05/29/2017 2220  INR 1.13   Recent Results (from the past 240 hour(s))  Culture, blood (Routine X 2) w Reflex to ID Panel     Status: None   Collection Time: 05/23/17  2:40 PM  Result Value Ref Range Status   Specimen Description BLOOD LEFT HAND  Final   Special Requests   Final    BOTTLES DRAWN AEROBIC AND ANAEROBIC Blood Culture results may not be optimal due to an inadequate volume of blood received in culture bottles   Culture NO GROWTH 5 DAYS  Final   Report Status 05/28/2017 FINAL  Final  Culture, blood (Routine X 2) w Reflex to ID  Panel     Status: None   Collection Time: 05/23/17  2:40 PM  Result Value Ref Range Status   Specimen Description BLOOD LEFT HAND  Final   Special Requests   Final    BOTTLES DRAWN AEROBIC AND ANAEROBIC Blood Culture adequate volume   Culture NO GROWTH 5 DAYS  Final   Report Status 05/28/2017 FINAL  Final  MRSA PCR Screening     Status: None   Collection Time: 05/23/2017  9:55 PM  Result Value Ref Range Status   MRSA by PCR NEGATIVE NEGATIVE Final    Comment:        The GeneXpert MRSA Assay (FDA approved for NASAL specimens only), is one component of a comprehensive MRSA colonization surveillance program. It is not intended to diagnose MRSA infection nor to guide or monitor treatment for MRSA infections.   Blood culture (routine x 2)     Status: None (Preliminary result)   Collection Time: 06/10/2017 10:20 PM  Result Value Ref Range Status   Specimen Description BLOOD RIGHT ARM  Final   Special Requests IN PEDIATRIC BOTTLE Blood Culture adequate volume  Final   Culture   Final    NO GROWTH 2 DAYS Performed at [facility], [place] [address] [place], [place] [address]    Report Status PENDING  Incomplete  Blood culture (routine x 2)     Status: None (Preliminary result)   Collection Time: 05/31/2017 10:20 PM  Result Value Ref Range Status   Specimen Description BLOOD LEFT HAND  Final   Special Requests IN PEDIATRIC BOTTLE Blood Culture adequate volume  Final   Culture   Final    NO GROWTH 2 DAYS Performed at [facility], [place] [address] [place], [place] [address]    Report Status PENDING  Incomplete      Radiology Studies: No results found.  Scheduled Meds: . arformoterol  15 mcg Nebulization BID  . baclofen  20 mg Per Tube QID  . budesonide  0.25 mg Nebulization BID  . feeding supplement (JEVITY 1.5 CAL/FIBER)  60 mL Per Tube Q2H  . levalbuterol  0.63 mg Nebulization TID  . primidone  200 mg Per Tube Q24H  . primidone  250 mg Per Tube BID  . ranitidine   150 mg Per Tube BID  . [name]  1.5 mg Per Tube TID  . sodium chloride flush  3 mL Intravenous Q12H   Continuous Infusions: . ceFEPime (MAXIPIME) IV Stopped (05/28/17 0848)  . dextrose 5 % and 0.45 % NaCl with KCl 20 mEq/L 65 mL (05/27/17 2350)  . metronidazole Stopped (05/28/17 0933)     LOS: 4 days   Time spent: 35 minutes.  Vance Gather, MD [facility] Pager [phone]  If 7PM-7AM, please contact night-coverage [email or web address] Password [email or web address] 05/28/2017, 11:14 AM

## 2017-05-28 NOTE — Consult Note (Signed)
Name: Renee [name] MRN: [id number] DOB: [birth year]    ADMISSION DATE:  06/20/2017 CONSULTATION DATE:  05/28/17  REFERRING MD :  Dr Bonner Puna, TRH  CHIEF COMPLAINT:  Recurrent aspiration PNA  HISTORY OF PRESENT ILLNESS: 31 year old woman with cerebral palsy, kyphoscoliosis, asthma followed in our office.  She was admitted 11/2 with increased work of breathing, increased secretions, blood-tinged sputum on suctioning.  She experienced progressive hypoxemia and was admitted for suspected pneumonia.  Received azithromycin and ceftriaxone, subsequently escalated to cefepime.  A CT scan of her chest showed chronic right lower lobe collapse (compared to prior films by me) and bibasilar mild groundglass.  Over the next 3 days her oxygen needs increased. PCCM consulted to assist.   PAST MEDICAL HISTORY :   has a past medical history of Asthma, Bone spur, Broken femur ([place]) (12/23/15), Cerebral palsy ([place]), DNI (do not intubate), DNR (do not resuscitate), DNR (do not resuscitate) (04/28/2014), Hip dysplasia, Hydrocephalus, Mental retardation, Osteopenia, Scoliosis, Seizure disorder ([place]), and UTI (urinary tract infection).  has a past surgical history that includes Spinal fusion (2005); vt shunt placement (1989); Laparoscopic Nissen fundoplication; Gastrostomy tube placement; Tympanostomy tube placement; Ileopsoas release (Bilateral); and Tenotomy adductor / hamstring closed (Bilateral, 04/25/00). Prior to Admission medications   Medication Sig Start Date End Date Taking? Authorizing Provider  albuterol (PROVENTIL) (2.5 MG/3ML) 0.083% nebulizer solution USE 1 VIAL IN NEBULIZER EVERY 6 HOURS FOR WHEEZING OR SHORTNESS OF BREATH. 03/15/17  Yes [name], MD  ALPRAZolam Duanne Moron) 0.25 MG tablet Give 1/4 to 1/2 tablet up to twice per day as needed for severe irritability 06/20/15  Yes Rockwell Germany, NP  arformoterol (BROVANA) 15 MCG/2ML NEBU Take 2 mLs (15 mcg total) by nebulization 2 (two) times daily. 09/09/15  Yes  [name], MD  azithromycin (ZITHROMAX) 200 MG/5ML suspension Take 6.4 mLs (256 mg total) by mouth daily. 05/23/17  Yes Orlie Dakin, MD  baclofen (LIORESAL) 10 MG tablet Give 2 tablets at 7AM, 11AM, 3PM and 10PM. May give additional 1 tablet at [facility] if needed 01/11/17  Yes Goodpasture, Otila Kluver, NP  budesonide (PULMICORT) 0.25 MG/2ML nebulizer solution Take 2 mLs (0.25 mg total) by nebulization 2 (two) times daily. 04/09/17  Yes [name], MD  diazepam (VALIUM) 1 MG/ML solution GIVE 3ML EVERY 8 HOURS AS NEEDED FOR AGITATION. 01/11/17  Yes Rockwell Germany, NP  HYDROcodone-acetaminophen (NORCO) 7.5-325 MG tablet Take 1 tablet by mouth every 6 (six) hours as needed for moderate pain. 11/23/16  Yes Newt Minion, MD  medroxyPROGESTERone (DEPO-PROVERA) 150 MG/ML injection INJECT 1ML INTO THE MUSCLE EVERY 3 MONTHS. 10/22/16  Yes Estill Dooms, NP  metoCLOPramide (REGLAN) 5 MG/5ML solution Use 2.5mg  prior to meals.  Can increase to 5mg  as needed 04/30/17  Yes Armbruster, Carlota Raspberry, MD  Nutritional Supplements (FEEDING SUPPLEMENT, JEVITY 1.5 CAL/FIBER,) LIQD Place 237 mLs into feeding tube 4 (four) times daily. 1 can at 0700, 1100, 1500, and 2000.   Yes [provider]  polyethylene glycol powder (MIRALAX) powder Take 1 capful every day. Titrate as needed. - via peg tube -  as needed for constipation 04/30/17  Yes Armbruster, Carlota Raspberry, MD  primidone (MYSOLINE) 50 MG tablet TAKE 5 TABLETS EVERY MORNING,4 TABLETS IN THE AFTERNOON, AND 5 TABLETS IN THE EVENING. 01/11/17  Yes Rockwell Germany, NP  ranitidine (ZANTAC) 150 MG/10ML syrup Place 10 mLs (150 mg total) into feeding tube 2 (two) times daily. 04/30/17  Yes Armbruster, Carlota Raspberry, MD  [name] (RISPERDAL) 0.5 MG tablet TAKE 3  TABS VIA G TUBE AT 7AM, 3 TABS AT 11AM, 3 TABS AT 3PM AND 1 TAB AT BEDTIME. 03/19/17  Yes Goodpasture, Otila Kluver, NP  tiZANidine (ZANAFLEX) 2 MG tablet Crush and give via G-tube - 2 tablets at 7AM, 1+1/2 tablets at 11AM, 1 tablet  at 3PM, 2 tablets at 6pm and 1+1/2 tablets at 10PM. 01/11/17  Yes Rockwell Germany, NP   Allergies  Allergen Reactions  . Other     Paper and adhesive tape - blisters  . Penicillins Rash    Has patient had a PCN reaction causing immediate rash, facial/tongue/throat swelling, SOB or lightheadedness with hypotension:yes Has patient had a PCN reaction causing severe rash involving mucus membranes or skin necrosis: NO Has patient had a PCN reaction that required hospitalization: NO Has patient had a PCN reaction occurring within the last 10 years: NO If all of the above answers are "NO", then may proceed with Cephalosporin use.   . Latex Rash and Other (See Comments)    blisters    FAMILY HISTORY:  family history includes Breast cancer in her maternal grandmother; Heart disease in her maternal grandfather; Thyroid disease in her maternal aunt and maternal grandfather. SOCIAL HISTORY:  reports that  has never smoked. she has never used smokeless tobacco. She reports that she does not drink alcohol or use drugs.  REVIEW OF SYSTEMS:   Patient unable to give  SUBJECTIVE:  Blood-tinged sputum appears to be subsiding last 24 hours Continues to have cough but is strong enough to clear secretions Currently requiring 100% mask  VITAL SIGNS: Temp:  [96.9 F (36.1 C)-100.1 F (37.8 C)] 98.8 F (37.1 C) (11/06 1222) Pulse Rate:  [94-127] 116 (11/06 1400) Resp:  [14-31] 25 (11/06 1400) BP: (91-118)/(42-63) 98/50 (11/06 1400) SpO2:  [84 %-99 %] 85 % (11/06 1400)  PHYSICAL EXAMINATION: General: Thin chronically ill woman Neuro: Awake, cries out when stimulated or bothered, strong cough HEENT: Appears to be managing secretions no stridor Cardiovascular: Regular, no murmur Lungs:  Decreased R base, bilateral coarse BS Abdomen:  Soft, PEG in place Musculoskeletal: Significant contractures Skin:  No obvious breakdown or rash  Recent Labs  Lab 05/26/17 0907 05/27/17 0400 05/28/17 0326    NA 141 142 141  K 3.1* 4.1 4.0  CL 102 104 102  CO2 31 31 32  BUN <5* <5* 5*  CREATININE <0.30* <0.30* <0.30*  GLUCOSE 98 124* 112*   Recent Labs  Lab 05/26/17 0907 05/27/17 0400 05/28/17 0326  HGB 13.7 13.2 12.4  HCT 42.9 40.0 38.3  WBC 9.1 6.0 10.2  PLT 237 237 213   No results found.  ASSESSMENT / PLAN: Cerebral palsy with congenital quadriplegia Acute on chronic hypoxemic respiratory failure due to bronchitis versus healthcare associated pneumonia History of mild persistent asthma without an acute exacerbation Hemoptysis due to the above, appears to be resolving Chronic right lower lobe collapse by serial CT scans, due to scoliosis and body habitus  RECS:  Agree with current antibiotics, cefepime plus Flagyl Follow culture data Continue Brovana, Pulmicort, levalbuterol as needed Try to minimize deep suctioning as this could have contributed to hemoptysis due to upper airway injury/irritation Consider pulmonary hygiene with chest vest or bed vibration.  There is some concern that this would be poorly tolerated as is physically fragile.  May not be possible Consider add Mucinex to help facilitate secretion clearance Wean oxygen as able  Baltazar Apo, MD, PhD 05/28/2017, 3:27 PM [facility] [phone] or if no answer  [phone]  

## 2017-05-29 DIAGNOSIS — A419 Sepsis, unspecified organism: Principal | ICD-10-CM

## 2017-05-29 DIAGNOSIS — G808 Other cerebral palsy: Secondary | ICD-10-CM

## 2017-05-29 DIAGNOSIS — J4530 Mild persistent asthma, uncomplicated: Secondary | ICD-10-CM

## 2017-05-29 DIAGNOSIS — J189 Pneumonia, unspecified organism: Secondary | ICD-10-CM

## 2017-05-29 LAB — CBC
HEMATOCRIT: 37.8 % (ref 36.0–46.0)
HEMOGLOBIN: 12.4 g/dL (ref 12.0–15.0)
MCH: 34.3 pg — AB (ref 26.0–34.0)
MCHC: 32.8 g/dL (ref 30.0–36.0)
MCV: 104.7 fL — ABNORMAL HIGH (ref 78.0–100.0)
Platelets: 232 10*3/uL (ref 150–400)
RBC: 3.61 MIL/uL — AB (ref 3.87–5.11)
RDW: 13.9 % (ref 11.5–15.5)
WBC: 7.2 10*3/uL (ref 4.0–10.5)

## 2017-05-29 LAB — BASIC METABOLIC PANEL
ANION GAP: 8 (ref 5–15)
BUN: 6 mg/dL (ref 6–20)
CALCIUM: 8.4 mg/dL — AB (ref 8.9–10.3)
CHLORIDE: 103 mmol/L (ref 101–111)
CO2: 31 mmol/L (ref 22–32)
Creatinine, Ser: <0.3 mg/dL — ABNORMAL LOW (ref 0.44–1.00)
GLUCOSE: 99 mg/dL (ref 65–99)
POTASSIUM: 4.5 mmol/L (ref 3.5–5.1)
Sodium: 142 mmol/L (ref 135–145)

## 2017-05-29 MED ORDER — GUAIFENESIN 100 MG/5ML PO SOLN
20.0000 mL | Freq: Two times a day (BID) | ORAL | Status: DC
  Administered 2017-05-29 – 2017-05-31 (×5): 400 mg
  Filled 2017-05-29 (×2): qty 40
  Filled 2017-05-29 (×2): qty 10
  Filled 2017-05-29: qty 40

## 2017-05-29 NOTE — Progress Notes (Signed)
   Name: Renee [name] MRN: [id number] DOB: 08-16-1985    ADMISSION DATE:  05/31/2017 CONSULTATION DATE:  05/28/17  REFERRING MD :  Dr Bonner Puna, TRH  CHIEF COMPLAINT:  Recurrent aspiration PNA  HISTORY OF PRESENT ILLNESS: 31 year old woman with cerebral palsy, kyphoscoliosis, asthma followed in our office.  She was admitted 11/2 with increased work of breathing, increased secretions, blood-tinged sputum on suctioning.  She experienced progressive hypoxemia and was admitted for suspected pneumonia.  Received azithromycin and ceftriaxone, subsequently escalated to cefepime.  A CT scan of her chest showed chronic right lower lobe collapse (compared to prior films by me) and bibasilar mild groundglass.  Over the next 3 days her oxygen needs increased. PCCM consulted to assist.   SUBJECTIVE:  Attempted to wean FiO2 yesterday p.m. but without success.  She is currently on a partial rebreather, 75% FiO2 She is still clearing nonpurulent secretions, suctioning from her mouth but not deep suctioning  VITAL SIGNS: Temp:  [98 F (36.7 C)-98.9 F (37.2 C)] 98.9 F (37.2 C) (11/07 0800) Pulse Rate:  [108-123] 122 (11/06 1717) Resp:  [18-33] 33 (11/06 1717) BP: (91-110)/(48-56) 110/48 (11/06 1600) SpO2:  [85 %-99 %] 91 % (11/06 1717)  PHYSICAL EXAMINATION: General: Thin chronically ill woman Neuro: Awake, strong cough, paraplegic HEENT: No stridor, managing saliva and able to cough secretions to her oropharynx for suctioning Cardiovascular: Regular, tachycardic, no murmur Lungs: Coarse bilateral breath sounds, decreased at her right base Abdomen: PEG in place, positive bowel sounds Musculoskeletal: Significant contractures Skin: No apparent skin breakdown  Recent Labs  Lab 05/27/17 0400 05/28/17 0326 05/29/17 0341  NA 142 141 142  K 4.1 4.0 4.5  CL 104 102 103  CO2 31 32 31  BUN <5* 5* 6  CREATININE <0.30* <0.30* <0.30*  GLUCOSE 124* 112* 99   Recent Labs  Lab 05/27/17 0400 05/28/17 0326  05/29/17 0341  HGB 13.2 12.4 12.4  HCT 40.0 38.3 37.8  WBC 6.0 10.2 7.2  PLT 237 213 232   No results found.  ASSESSMENT / PLAN: Cerebral palsy with congenital quadriplegia Acute on chronic hypoxemic respiratory failure due to bronchitis versus healthcare associated pneumonia History of mild persistent asthma without an acute exacerbation Hemoptysis due to the above plus possible component of suctioning, appears to be resolving Chronic right lower lobe collapse by serial CT scans, due to scoliosis and body habitus  RECS:  Agree with current antibiotics, cefepime and Flagyl Follow culture data, unrevealing so far Continue Brovana, Pulmicort, Xopenex as needed Add guaifenesin scheduled on 11/7 Have considered chest vest or bed vibration.  I do not think she would tolerate the vest.  Vibration is still a possibility but it may also be poorly tolerated given her fragile state.  Her caregiver believes that she might not tolerate so I have not initiated Continue to wean oxygen as able.  Suspect that she will go home with oxygen once we are able to get her off high flow Continue to assist with secretion clearance, suctioning from her mouth   Baltazar Apo, MD, PhD 05/29/2017, 9:46 AM [facility] [phone] or if no answer [phone]

## 2017-05-29 NOTE — Progress Notes (Signed)
IV access infiltrated unable to find access, notified Dr. Karleen Hampshire, order for PICC line placement.

## 2017-05-29 NOTE — Progress Notes (Signed)
PROGRESS NOTE    Renee [name]  KTG:[id number] DOB: May 27, 1986 DOA: 06/04/2017 PCP: Sharilyn Sites, MD    Brief Narrative: Renee [name] a 31 y.o.femalewith a history of CP, kyphoscoliosis, asthma,  recurrent aspiration pneumonia s/p PEG presents from home with hemoptysis, was found to have RLL pneumonia/ aspiration pneumonia and was admitted to SDU, currently requiring high flow oxygen.     Assessment & Plan:   Principal Problem:   Aspiration pneumonia ([place]) Active Problems:   Mild persistent asthma   Seizure disorder ([place])   Congenital quadriplegia (HCC)   Protein-calorie malnutrition, severe (HCC)   Sepsis due to pneumonia ([place])   Asthma   Acute respiratory failure with hypoxia (HCC)  Cerebral palsy with congenital quadriplegia:  Stable. Resume home meds. No change.    Acute on chronic respiratory failure due to recurrent aspiration pneumonitis/ health care associated pneumonia Currently requiring high flow oxygen.  Pulmonology on board and assisting [place] with management.  Resume IV antibiotics, and bronchodilators.  Blood cultures negative so far. Continues to have hemoptysis , but better than yesterday.  Too fragile to tolerate chest vest or bed vibration.  [place] oxygen as needed to keep sats greater than 90%.  Gentle hydration.   Sepsis from recurrently aspiration pneumonia of RLL:  Resume IV antibiotics.  See above.  Improving. Continue with gentle hydration.    Hypokalemia replaced  Nutrition on Jevity. Will get dietary consult.   Tachycardia:  From increased work of breathing.    DVT prophylaxis:  Code Status: DNR Family Communication: MOM at bedside.  Disposition Plan:  Pending further evaluation.    Consultants:   PCCM.   Procedures: (NONE.   Antimicrobials: since admission, pt been on flagyl and cefepime.    Subjective: Does not appear to bein discomfort, no agitation seen this am.   Objective: Vitals:   05/28/17 1700 05/28/17 1717 05/28/17  2300 05/29/17 0330  BP:      Pulse: (!) 123 (!) 122    Resp: (!) 26 (!) 33    Temp:   98 F (36.7 C) 98.4 F (36.9 C)  TempSrc:   Axillary Axillary  SpO2: (!) 87% 91%    Weight:      Height:        Intake/Output Summary (Last 24 hours) at 05/29/2017 0828 Last data filed at 05/29/2017 0555 Gross per 24 hour  Intake 2279.58 ml  Output -  Net 2279.58 ml   Filed Weights   05/31/2017 2200  Weight: 25.4 kg (55 lb 16 oz)    Examination:  General exam: frail, ill looking , but calm.  Respiratory system: scattered rhonchi on the right.  Cardiovascular system: S1 & S2 heard tachycardic, no pedal edema no JVD. Gastrointestinal system: Abdomen is SOFT NON DISTENDED . Bowel sounds heard.  Central nervous system: alert.  Extremities: no edema or cyanosis.  Skin: No rashes, lesions or ulcers Psychiatry: cant be assessed.     Data Reviewed: I have personally reviewed following labs and imaging studies  CBC: Recent Labs  Lab 05/23/17 1156 06/07/2017 1446 05/25/17 0351 05/26/17 0907 05/27/17 0400 05/28/17 0326 05/29/17 0341  WBC 8.1 9.4 10.1 9.1 6.0 10.2 7.2  NEUTROABS 5.5 6.4  --   --   --   --   --   HGB 15.0 15.6* 14.0 13.7 13.2 12.4 12.4  HCT 46.4* 46.5* 43.4 42.9 40.0 38.3 37.8  MCV 104.0* 102.0* 103.6* 105.9* 104.7* 103.5* 104.7*  PLT 265 306 207 237 237 213 232  Basic Metabolic Panel: Recent Labs  Lab 05/25/17 0351 05/26/17 0907 05/27/17 0400 05/28/17 0326 05/29/17 0341  NA 141 141 142 141 142  K 3.3* 3.1* 4.1 4.0 4.5  CL 104 102 104 102 103  CO2 26 31 31  32 31  GLUCOSE 77 98 124* 112* 99  BUN 10 <5* <5* 5* 6  CREATININE <0.30* <0.30* <0.30* <0.30* <0.30*  CALCIUM 8.3* 8.7* 8.3* 8.3* 8.4*  MG  --   --  1.8  --   --    GFR: CrCl cannot be calculated (This lab value cannot be used to calculate CrCl because it is not a number: <0.30). Liver Function Tests: No results for input(s): AST, ALT, ALKPHOS, BILITOT, PROT, ALBUMIN in the last 168 hours. No results  for input(s): LIPASE, AMYLASE in the last 168 hours. No results for input(s): AMMONIA in the last 168 hours. Coagulation Profile: Recent Labs  Lab 05/28/2017 2220  INR 1.13   Cardiac Enzymes: No results for input(s): CKTOTAL, CKMB, CKMBINDEX, TROPONINI in the last 168 hours. BNP (last 3 results) No results for input(s): PROBNP in the last 8760 hours. HbA1C: No results for input(s): HGBA1C in the last 72 hours. CBG: No results for input(s): GLUCAP in the last 168 hours. Lipid Profile: No results for input(s): CHOL, HDL, LDLCALC, TRIG, CHOLHDL, LDLDIRECT in the last 72 hours. Thyroid Function Tests: No results for input(s): TSH, T4TOTAL, FREET4, T3FREE, THYROIDAB in the last 72 hours. Anemia Panel: No results for input(s): VITAMINB12, FOLATE, FERRITIN, TIBC, IRON, RETICCTPCT in the last 72 hours. Sepsis Labs: No results for input(s): PROCALCITON, LATICACIDVEN in the last 168 hours.  Recent Results (from the past 240 hour(s))  Culture, blood (Routine X 2) w Reflex to ID Panel     Status: None   Collection Time: 05/23/17  2:40 PM  Result Value Ref Range Status   Specimen Description BLOOD LEFT HAND  Final   Special Requests   Final    BOTTLES DRAWN AEROBIC AND ANAEROBIC Blood Culture results may not be optimal due to an inadequate volume of blood received in culture bottles   Culture NO GROWTH 5 DAYS  Final   Report Status 05/28/2017 FINAL  Final  Culture, blood (Routine X 2) w Reflex to ID Panel     Status: None   Collection Time: 05/23/17  2:40 PM  Result Value Ref Range Status   Specimen Description BLOOD LEFT HAND  Final   Special Requests   Final    BOTTLES DRAWN AEROBIC AND ANAEROBIC Blood Culture adequate volume   Culture NO GROWTH 5 DAYS  Final   Report Status 05/28/2017 FINAL  Final  MRSA PCR Screening     Status: None   Collection Time: 06/12/2017  9:55 PM  Result Value Ref Range Status   MRSA by PCR NEGATIVE NEGATIVE Final    Comment:        The GeneXpert MRSA Assay  (FDA approved for NASAL specimens only), is one component of a comprehensive MRSA colonization surveillance program. It is not intended to diagnose MRSA infection nor to guide or monitor treatment for MRSA infections.   Blood culture (routine x 2)     Status: None (Preliminary result)   Collection Time: 05/31/2017 10:20 PM  Result Value Ref Range Status   Specimen Description BLOOD RIGHT ARM  Final   Special Requests IN PEDIATRIC BOTTLE Blood Culture adequate volume  Final   Culture   Final    NO GROWTH 3 DAYS Performed at [facility]  [facility], [place] [address] [place], [place] [address]    Report Status PENDING  Incomplete  Blood culture (routine x 2)     Status: None (Preliminary result)   Collection Time: 06/02/2017 10:20 PM  Result Value Ref Range Status   Specimen Description BLOOD LEFT HAND  Final   Special Requests IN PEDIATRIC BOTTLE Blood Culture adequate volume  Final   Culture   Final    NO GROWTH 3 DAYS Performed at [facility], [place] [address], [place], [place] [address]    Report Status PENDING  Incomplete         Radiology Studies: No results found.      Scheduled Meds: . arformoterol  15 mcg Nebulization BID  . baclofen  20 mg Per Tube QID  . budesonide  0.25 mg Nebulization BID  . feeding supplement (JEVITY 1.5 CAL/FIBER)  60 mL Per Tube Q2H  . levalbuterol  0.63 mg Nebulization TID  . primidone  200 mg Per Tube Q24H  . primidone  250 mg Per Tube BID  . ranitidine  150 mg Per Tube BID  . [name]  1.5 mg Per Tube TID  . sodium chloride flush  3 mL Intravenous Q12H   Continuous Infusions: . ceFEPime (MAXIPIME) IV Stopped (05/28/17 2240)  . dextrose 5 % and 0.45 % NaCl with KCl 20 mEq/L 65 mL/hr at 05/28/17 2024  . metronidazole Stopped (05/29/17 0329)     LOS: 5 days    Time spent:  35 minutes.     Hosie Poisson, MD [facility] Pager [phone]   If 7PM-7AM, please contact night-coverage [email or web address] Password  [facility] 05/29/2017, 8:28 AM

## 2017-05-29 NOTE — Progress Notes (Signed)
Order for bedside PICC placement by IV Team. Patient with contractures that prevent visualization of veins of upper arm or ability to move arm to aid in placement. Jamela RN notified to contact MD to change order to IR placement of PICC.

## 2017-05-30 ENCOUNTER — Encounter (HOSPITAL_COMMUNITY): Payer: Self-pay | Admitting: Interventional Radiology

## 2017-05-30 ENCOUNTER — Inpatient Hospital Stay (HOSPITAL_COMMUNITY): Payer: 59

## 2017-05-30 DIAGNOSIS — E43 Unspecified severe protein-calorie malnutrition: Secondary | ICD-10-CM

## 2017-05-30 HISTORY — PX: IR FLUORO GUIDE CV LINE RIGHT: IMG2283

## 2017-05-30 HISTORY — PX: IR US GUIDE VASC ACCESS RIGHT: IMG2390

## 2017-05-30 LAB — BASIC METABOLIC PANEL
Anion gap: 5 (ref 5–15)
BUN: 6 mg/dL (ref 6–20)
CALCIUM: 8.2 mg/dL — AB (ref 8.9–10.3)
CHLORIDE: 100 mmol/L — AB (ref 101–111)
CO2: 34 mmol/L — ABNORMAL HIGH (ref 22–32)
Glucose, Bld: 122 mg/dL — ABNORMAL HIGH (ref 65–99)
Potassium: 3.8 mmol/L (ref 3.5–5.1)
SODIUM: 139 mmol/L (ref 135–145)

## 2017-05-30 LAB — CBC
HCT: 38.1 % (ref 36.0–46.0)
Hemoglobin: 12 g/dL (ref 12.0–15.0)
MCH: 33.1 pg (ref 26.0–34.0)
MCHC: 31.5 g/dL (ref 30.0–36.0)
MCV: 105 fL — ABNORMAL HIGH (ref 78.0–100.0)
PLATELETS: 221 10*3/uL (ref 150–400)
RBC: 3.63 MIL/uL — AB (ref 3.87–5.11)
RDW: 13.8 % (ref 11.5–15.5)
WBC: 11.6 10*3/uL — AB (ref 4.0–10.5)

## 2017-05-30 LAB — CULTURE, BLOOD (ROUTINE X 2)
CULTURE: NO GROWTH
Culture: NO GROWTH
SPECIAL REQUESTS: ADEQUATE
Special Requests: ADEQUATE

## 2017-05-30 MED ORDER — LIDOCAINE HCL 1 % IJ SOLN
INTRAMUSCULAR | Status: AC
  Administered 2017-05-30: 13:00:00
  Filled 2017-05-30: qty 20

## 2017-05-30 MED ORDER — LIP MEDEX EX OINT
TOPICAL_OINTMENT | CUTANEOUS | Status: AC
  Filled 2017-05-30: qty 7

## 2017-05-30 MED ORDER — LIDOCAINE HCL 1 % IJ SOLN
INTRAMUSCULAR | Status: AC | PRN
  Administered 2017-05-30: 5 mL

## 2017-05-30 NOTE — Progress Notes (Signed)
Pharmacy Antibiotic Note  Jonice Cerra is a 31 y.o. female admitted on 05/30/2017 with pneumonia.  Pharmacy has been consulted for Cefepime dosing.  Today, 05/30/2017 Day #6 cefepime - SCr < 0.3 - WBC WNL - Tm 100.5  Plan:  Continue Cefepime 1g IV q12 hr  Flagyl per MD, dosing appropriate  Pharmacy will follow in background  Height: 4' (121.9 cm) Weight: 55 lb 16 oz (25.4 kg) IBW/kg (Calculated) : 17.9  Temp (24hrs), Avg:98.7 F (37.1 C), Min:98 F (36.7 C), Max:100.5 F (38.1 C)  Recent Labs  Lab 05/25/17 0351 05/26/17 0907 05/27/17 0400 05/28/17 0326 05/29/17 0341  WBC 10.1 9.1 6.0 10.2 7.2  CREATININE <0.30* <0.30* <0.30* <0.30* <0.30*    CrCl cannot be calculated (This lab value cannot be used to calculate CrCl because it is not a number: <0.30).    Allergies  Allergen Reactions  . Other     Paper and adhesive tape - blisters  . Penicillins Rash    Has patient had a PCN reaction causing immediate rash, facial/tongue/throat swelling, SOB or lightheadedness with hypotension:yes Has patient had a PCN reaction causing severe rash involving mucus membranes or skin necrosis: NO Has patient had a PCN reaction that required hospitalization: NO Has patient had a PCN reaction occurring within the last 10 years: NO If all of the above answers are "NO", then may proceed with Cephalosporin use.   . Latex Rash and Other (See Comments)    blisters   Antimicrobials this admission: 11/2 Rocephin/Azithromycin  >> x1 ED 11/2 Cefepime >>  11/2 Vancomycin >> 11/4 11/3 metronidazole >>    Dose adjustments this admission:   Microbiology results: 11/1 Bcx NGTD  11/2 BCx: NGTD 11/2 MRSA PCR: negative   Thank you for allowing pharmacy to be a part of this patient's care.  Doreene Eland, PharmD, BCPS.   Pager: [phone] 05/30/2017 12:02 PM

## 2017-05-30 NOTE — Progress Notes (Signed)
Date: May 30, 2017 Rhonda Davis, BSN, RN3, CCM  [phone] Chart and notes review for patient progress and needs. Will follow for case management and discharge needs. Next review date: [id number] 

## 2017-05-30 NOTE — Progress Notes (Signed)
Pt is resting comfortably at this time. BiPAP not indicated. RT will continue to monitor patient and will place if needed.

## 2017-05-30 NOTE — Progress Notes (Signed)
Name: Renee [name] MRN: [id number] DOB: [birth year]    ADMISSION DATE:  06/06/2017 CONSULTATION DATE:  05/28/17  REFERRING MD :  Dr Bonner Puna, TRH  CHIEF COMPLAINT:  Recurrent aspiration PNA  HISTORY OF PRESENT ILLNESS: 31 year old woman with cerebral palsy, kyphoscoliosis, asthma followed in our office.  She was admitted 11/2 with increased work of breathing, increased secretions, blood-tinged sputum on suctioning.  She experienced progressive hypoxemia and was admitted for suspected pneumonia.  Received azithromycin and ceftriaxone, subsequently escalated to cefepime.  A CT scan of her chest showed chronic right lower lobe collapse (compared to prior films by me) and bibasilar mild groundglass.  Over the next 3 days her oxygen needs increased. PCCM consulted to assist.   SUBJECTIVE:  Again no real change over the last 24 hours. Still has difficulty in secretions, coughing them up to be suctioned from her mouth Remains on partial nonrebreather mask  VITAL SIGNS: Temp:  [98 F (36.7 C)-100.5 F (38.1 C)] 98 F (36.7 C) (11/08 0800) Pulse Rate:  [94-119] 114 (11/08 1000) Resp:  [21-33] 30 (11/08 1000) BP: (93-107)/(48-66) 100/59 (11/08 1000) SpO2:  [76 %-98 %] 84 % (11/08 1000) FiO2 (%):  [55 %-60 %] 60 % (11/08 0839)  PHYSICAL EXAMINATION: General: Thin chronically ill woman Neuro: Awake, strong cough, paraplegic HEENT: No stridor, managing saliva and able to cough secretions to her oropharynx for suctioning Cardiovascular: Regular, tachycardic, no murmur Lungs: Coarse bilateral breath sounds, decreased at her right base Abdomen: PEG in place, positive bowel sounds Musculoskeletal: Significant contractures Skin: No apparent skin breakdown  Recent Labs  Lab 05/27/17 0400 05/28/17 0326 05/29/17 0341  NA 142 141 142  K 4.1 4.0 4.5  CL 104 102 103  CO2 31 32 31  BUN <5* 5* 6  CREATININE <0.30* <0.30* <0.30*  GLUCOSE 124* 112* 99   Recent Labs  Lab 05/27/17 0400  05/28/17 0326 05/29/17 0341  HGB 13.2 12.4 12.4  HCT 40.0 38.3 37.8  WBC 6.0 10.2 7.2  PLT 237 213 232   Ir Fluoro Guide Cv Line Right  Result Date: 05/30/2017 CLINICAL DATA:  Pneumonia, poor IV access, needs durable IV access for antibiotic regimen. EXAM: RIGHT IJ CENTRAL VENOUS CATHETER PLACEMENT UNDER ULTRASOUND AND FLUOROSCOPIC GUIDANCE FLUOROSCOPY TIME:  0.2 minutes; 9  uGym2 DAP TECHNIQUE: The procedure, risks (including but not limited to bleeding, infection, organ damage ), benefits, and alternatives were explained to the mother. Questions regarding the procedure were encouraged and answered. The mother understands and consents to the procedure. The patient's arms were contracted and could not be positioned appropriately to allow PICC placement. For this reason, IJ access was selected. Patency of the right IJ vein was confirmed with ultrasound with image documentation. An appropriate skin site was determined. Skin site was marked. Region was prepped using maximum barrier technique including cap and mask, sterile gown, sterile gloves, large sterile sheet, and Chlorhexidine as cutaneous antisepsis. The region was infiltrated locally with 1% lidocaine. Under real-time ultrasound guidance, the right IJ vein was accessed with a 21 gauge micropuncture needle; the needle tip within the vein was confirmed with ultrasound image documentation. The needle exchanged over a guidewire for peel-away sheath through which an 14cm 5 [place] PowerPICC dual lumen catheter was advanced. This was positioned with the tip at the cavoatrial junction. Spot chest radiograph shows good positioning and no pneumothorax. Catheter was flushed and sutured externally with 0-Prolene sutures. Patient tolerated the procedure well. COMPLICATIONS: None immediate IMPRESSION: 1. Technically successful right IJ double  lumen power injectable central venous catheter placement. Electronically Signed   By: Lucrezia Europe M.D.   On: 05/30/2017 12:29    Ir US Guide Vasc Access Right  Result Date: 05/30/2017 CLINICAL DATA:  Pneumonia, poor IV access, needs durable IV access for antibiotic regimen. EXAM: RIGHT IJ CENTRAL VENOUS CATHETER PLACEMENT UNDER ULTRASOUND AND FLUOROSCOPIC GUIDANCE FLUOROSCOPY TIME:  0.2 minutes; 9  uGym2 DAP TECHNIQUE: The procedure, risks (including but not limited to bleeding, infection, organ damage ), benefits, and alternatives were explained to the mother. Questions regarding the procedure were encouraged and answered. The mother understands and consents to the procedure. The patient's arms were contracted and could not be positioned appropriately to allow PICC placement. For this reason, IJ access was selected. Patency of the right IJ vein was confirmed with ultrasound with image documentation. An appropriate skin site was determined. Skin site was marked. Region was prepped using maximum barrier technique including cap and mask, sterile gown, sterile gloves, large sterile sheet, and Chlorhexidine as cutaneous antisepsis. The region was infiltrated locally with 1% lidocaine. Under real-time ultrasound guidance, the right IJ vein was accessed with a 21 gauge micropuncture needle; the needle tip within the vein was confirmed with ultrasound image documentation. The needle exchanged over a guidewire for peel-away sheath through which an 14cm 5 [place] PowerPICC dual lumen catheter was advanced. This was positioned with the tip at the cavoatrial junction. Spot chest radiograph shows good positioning and no pneumothorax. Catheter was flushed and sutured externally with 0-Prolene sutures. Patient tolerated the procedure well. COMPLICATIONS: None immediate IMPRESSION: 1. Technically successful right IJ double lumen power injectable central venous catheter placement. Electronically Signed   By: Lucrezia Europe M.D.   On: 05/30/2017 12:29    ASSESSMENT / PLAN: Cerebral palsy with congenital quadriplegia Acute on chronic hypoxemic  respiratory failure due to bronchitis versus healthcare associated pneumonia History of mild persistent asthma without an acute exacerbation Hemoptysis due to the above plus possible component of suctioning, appears to be resolving Chronic right lower lobe collapse by serial CT scans, due to scoliosis and body habitus  RECS:  Antibiotics as ordered, follow culture data Bronchodilators-prolonged, Pulmicort, Xopenex Continue guaifenesin I will try initiating bed vibration for chest physiotherapy.  Whether she will tolerate.  If unable to do so then we will discontinue [place] oxygen as able    Baltazar Apo, MD, PhD 05/30/2017, 1:41 PM [facility] [phone] or if no answer [phone]

## 2017-05-30 NOTE — Progress Notes (Signed)
CPT held because bed is not operational.  RN alerted and verified condition of bed.

## 2017-05-30 NOTE — Progress Notes (Signed)
PROGRESS NOTE    Renee [name]  BTD:[id number] DOB: 1985-12-27 DOA: 05/27/2017 PCP: Renee Sites, MD    Brief Narrative: Renee [name] a 31 y.o.femalewith a history of CP, kyphoscoliosis, asthma,  recurrent aspiration pneumonia s/p PEG presents from home with hemoptysis, was found to have RLL pneumonia/ aspiration pneumonia and was admitted to SDU, currently requiring high flow oxygen.     Assessment & Plan:   Principal Problem:   Aspiration pneumonia ([place]) Active Problems:   Mild persistent asthma   Seizure disorder ([place])   Congenital quadriplegia (HCC)   Protein-calorie malnutrition, severe (HCC)   Sepsis due to pneumonia ([place])   Asthma   Acute respiratory failure with hypoxia (HCC)  Cerebral palsy with congenital quadriplegia:  Stable. Resume home meds. No changes in meds.    Acute on chronic respiratory failure due to recurrent aspiration pneumonitis/ health care associated pneumonia Currently requiring high flow oxygen.  Pulmonology on board and assisting [place] with management.  Resume IV antibiotics, and bronchodilators.  Blood cultures negative so far.Hemoptysis improved.  chest vest will be ordered by pulmonology.  [place] oxygen as needed to keep sats greater than 90%.  Gentle hydration.   Sepsis from recurrently aspiration pneumonia of RLL:  Resume IV antibiotics.  See above.  Continue with gentle hydration.  Febrile overnight, tachycardic, tachypnea, and hypotensive.     Hypokalemia replaced  Nutrition on Jevity. Will get dietary consult.   Tachycardia:  From increased work of breathing.    DVT prophylaxis:  Code Status: DNR Family Communication: care giver at bedside.  Disposition Plan:  Pending resolution of the hypoxia.    Consultants:   PCCM.   Procedures: (NONE.   Antimicrobials: since admission, pt been on flagyl and cefepime.    Subjective: No change in her status clinically.   Objective: Vitals:   05/30/17 0600 05/30/17 0800 05/30/17  1000 05/30/17 1200  BP: (!) 102/59 104/66 (!) 100/59   Pulse: (!) 106 (!) 115 (!) 114   Resp: (!) 24 (!) 26 (!) 30   Temp:  98 F (36.7 C)  98.1 F (36.7 C)  TempSrc:  Axillary  Axillary  SpO2: 95% 90% (!) 84%   Weight:      Height:        Intake/Output Summary (Last 24 hours) at 05/30/2017 1347 Last data filed at 05/29/2017 1800 Gross per 24 hour  Intake 950 ml  Output -  Net 950 ml   Filed Weights   06/07/2017 2200  Weight: 25.4 kg (55 lb 16 oz)    Examination:  General exam: frail, ill looking , slightly restless.  Respiratory system: scattered rhonchi bilateral. No wheeing heard.  Cardiovascular system: S1 & S2 heard tachycardic, no pedal edema no JVD. Gastrointestinal system: Abdomen is soft non tender non disteded, g tube in place. Bowel sounds heard.  Central nervous system: alert.  Extremities: no edema or cyanosis.  Skin: No rashes, lesions or ulcers Psychiatry: cant be assessed.     Data Reviewed: I have personally reviewed following labs and imaging studies  CBC: Recent Labs  Lab 06/07/2017 1446 05/25/17 0351 05/26/17 0907 05/27/17 0400 05/28/17 0326 05/29/17 0341  WBC 9.4 10.1 9.1 6.0 10.2 7.2  NEUTROABS 6.4  --   --   --   --   --   HGB 15.6* 14.0 13.7 13.2 12.4 12.4  HCT 46.5* 43.4 42.9 40.0 38.3 37.8  MCV 102.0* 103.6* 105.9* 104.7* 103.5* 104.7*  PLT 306 207 237 237 213 232   Basic  Metabolic Panel: Recent Labs  Lab 05/25/17 0351 05/26/17 0907 05/27/17 0400 05/28/17 0326 05/29/17 0341  NA 141 141 142 141 142  K 3.3* 3.1* 4.1 4.0 4.5  CL 104 102 104 102 103  CO2 26 31 31  32 31  GLUCOSE 77 98 124* 112* 99  BUN 10 <5* <5* 5* 6  CREATININE <0.30* <0.30* <0.30* <0.30* <0.30*  CALCIUM 8.3* 8.7* 8.3* 8.3* 8.4*  MG  --   --  1.8  --   --    GFR: CrCl cannot be calculated (This lab value cannot be used to calculate CrCl because it is not a number: <0.30). Liver Function Tests: No results for input(s): AST, ALT, ALKPHOS, BILITOT, PROT,  ALBUMIN in the last 168 hours. No results for input(s): LIPASE, AMYLASE in the last 168 hours. No results for input(s): AMMONIA in the last 168 hours. Coagulation Profile: Recent Labs  Lab 06/15/2017 2220  INR 1.13   Cardiac Enzymes: No results for input(s): CKTOTAL, CKMB, CKMBINDEX, TROPONINI in the last 168 hours. BNP (last 3 results) No results for input(s): PROBNP in the last 8760 hours. HbA1C: No results for input(s): HGBA1C in the last 72 hours. CBG: No results for input(s): GLUCAP in the last 168 hours. Lipid Profile: No results for input(s): CHOL, HDL, LDLCALC, TRIG, CHOLHDL, LDLDIRECT in the last 72 hours. Thyroid Function Tests: No results for input(s): TSH, T4TOTAL, FREET4, T3FREE, THYROIDAB in the last 72 hours. Anemia Panel: No results for input(s): VITAMINB12, FOLATE, FERRITIN, TIBC, IRON, RETICCTPCT in the last 72 hours. Sepsis Labs: No results for input(s): PROCALCITON, LATICACIDVEN in the last 168 hours.  Recent Results (from the past 240 hour(s))  Culture, blood (Routine X 2) w Reflex to ID Panel     Status: None   Collection Time: 05/23/17  2:40 PM  Result Value Ref Range Status   Specimen Description BLOOD LEFT HAND  Final   Special Requests   Final    BOTTLES DRAWN AEROBIC AND ANAEROBIC Blood Culture results may not be optimal due to an inadequate volume of blood received in culture bottles   Culture NO GROWTH 5 DAYS  Final   Report Status 05/28/2017 FINAL  Final  Culture, blood (Routine X 2) w Reflex to ID Panel     Status: None   Collection Time: 05/23/17  2:40 PM  Result Value Ref Range Status   Specimen Description BLOOD LEFT HAND  Final   Special Requests   Final    BOTTLES DRAWN AEROBIC AND ANAEROBIC Blood Culture adequate volume   Culture NO GROWTH 5 DAYS  Final   Report Status 05/28/2017 FINAL  Final  MRSA PCR Screening     Status: None   Collection Time: 06/06/2017  9:55 PM  Result Value Ref Range Status   MRSA by PCR NEGATIVE NEGATIVE Final     Comment:        The GeneXpert MRSA Assay (FDA approved for NASAL specimens only), is one component of a comprehensive MRSA colonization surveillance program. It is not intended to diagnose MRSA infection nor to guide or monitor treatment for MRSA infections.   Blood culture (routine x 2)     Status: None   Collection Time: 06/21/2017 10:20 PM  Result Value Ref Range Status   Specimen Description BLOOD RIGHT ARM  Final   Special Requests IN PEDIATRIC BOTTLE Blood Culture adequate volume  Final   Culture   Final    NO GROWTH 5 DAYS Performed at [facility],  [address], [place], [place] [address]    Report Status 05/30/2017 FINAL  Final  Blood culture (routine x 2)     Status: None   Collection Time: 05/23/2017 10:20 PM  Result Value Ref Range Status   Specimen Description BLOOD LEFT HAND  Final   Special Requests IN PEDIATRIC BOTTLE Blood Culture adequate volume  Final   Culture   Final    NO GROWTH 5 DAYS Performed at [facility], [place] [address] [place], [place] [address]    Report Status 05/30/2017 FINAL  Final         Radiology Studies: Ir Fluoro Guide Cv Line Right  Result Date: 05/30/2017 CLINICAL DATA:  Pneumonia, poor IV access, needs durable IV access for antibiotic regimen. EXAM: RIGHT IJ CENTRAL VENOUS CATHETER PLACEMENT UNDER ULTRASOUND AND FLUOROSCOPIC GUIDANCE FLUOROSCOPY TIME:  0.2 minutes; 9  uGym2 DAP TECHNIQUE: The procedure, risks (including but not limited to bleeding, infection, organ damage ), benefits, and alternatives were explained to the mother. Questions regarding the procedure were encouraged and answered. The mother understands and consents to the procedure. The patient's arms were contracted and could not be positioned appropriately to allow PICC placement. For this reason, IJ access was selected. Patency of the right IJ vein was confirmed with ultrasound with image documentation. An appropriate skin site was determined. Skin site was  marked. Region was prepped using maximum barrier technique including cap and mask, sterile gown, sterile gloves, large sterile sheet, and Chlorhexidine as cutaneous antisepsis. The region was infiltrated locally with 1% lidocaine. Under real-time ultrasound guidance, the right IJ vein was accessed with a 21 gauge micropuncture needle; the needle tip within the vein was confirmed with ultrasound image documentation. The needle exchanged over a guidewire for peel-away sheath through which an 14cm 5 [place] PowerPICC dual lumen catheter was advanced. This was positioned with the tip at the cavoatrial junction. Spot chest radiograph shows good positioning and no pneumothorax. Catheter was flushed and sutured externally with 0-Prolene sutures. Patient tolerated the procedure well. COMPLICATIONS: None immediate IMPRESSION: 1. Technically successful right IJ double lumen power injectable central venous catheter placement. Electronically Signed   By: Lucrezia Europe M.D.   On: 05/30/2017 12:29   Ir US Guide Vasc Access Right  Result Date: 05/30/2017 CLINICAL DATA:  Pneumonia, poor IV access, needs durable IV access for antibiotic regimen. EXAM: RIGHT IJ CENTRAL VENOUS CATHETER PLACEMENT UNDER ULTRASOUND AND FLUOROSCOPIC GUIDANCE FLUOROSCOPY TIME:  0.2 minutes; 9  uGym2 DAP TECHNIQUE: The procedure, risks (including but not limited to bleeding, infection, organ damage ), benefits, and alternatives were explained to the mother. Questions regarding the procedure were encouraged and answered. The mother understands and consents to the procedure. The patient's arms were contracted and could not be positioned appropriately to allow PICC placement. For this reason, IJ access was selected. Patency of the right IJ vein was confirmed with ultrasound with image documentation. An appropriate skin site was determined. Skin site was marked. Region was prepped using maximum barrier technique including cap and mask, sterile gown, sterile  gloves, large sterile sheet, and Chlorhexidine as cutaneous antisepsis. The region was infiltrated locally with 1% lidocaine. Under real-time ultrasound guidance, the right IJ vein was accessed with a 21 gauge micropuncture needle; the needle tip within the vein was confirmed with ultrasound image documentation. The needle exchanged over a guidewire for peel-away sheath through which an 14cm 5 [place] PowerPICC dual lumen catheter was advanced. This was positioned with the tip at the  cavoatrial junction. Spot chest radiograph shows good positioning and no pneumothorax. Catheter was flushed and sutured externally with 0-Prolene sutures. Patient tolerated the procedure well. COMPLICATIONS: None immediate IMPRESSION: 1. Technically successful right IJ double lumen power injectable central venous catheter placement. Electronically Signed   By: Lucrezia Europe M.D.   On: 05/30/2017 12:29        Scheduled Meds: . arformoterol  15 mcg Nebulization BID  . baclofen  20 mg Per Tube QID  . budesonide  0.25 mg Nebulization BID  . feeding supplement (JEVITY 1.5 CAL/FIBER)  60 mL Per Tube Q2H  . guaiFENesin  20 mL Per Tube BID  . levalbuterol  0.63 mg Nebulization TID  . primidone  200 mg Per Tube Q24H  . primidone  250 mg Per Tube BID  . ranitidine  150 mg Per Tube BID  . [name]  1.5 mg Per Tube TID  . sodium chloride flush  3 mL Intravenous Q12H   Continuous Infusions: . ceFEPime (MAXIPIME) IV Stopped (05/30/17 1040)  . dextrose 5 % and 0.45 % NaCl with KCl 20 mEq/L 65 mL/hr at 05/30/17 0955  . metronidazole Stopped (05/30/17 1148)     LOS: 6 days    Time spent:  35 minutes.     Hosie Poisson, MD [facility] Pager [phone]   If 7PM-7AM, please contact night-coverage [email or web address] Password [email or web address] 05/30/2017, 1:47 PM

## 2017-05-30 NOTE — Procedures (Signed)
Notified because of pt's decrease SpO2 (85%).  Upon arrival use of accessory muscles noted.  PNRB replaced with NRB.  Pt's SpO2 increased to 99 and use of accessory muscles decreased.  RN notified for possible need of medication. BiPAP not applicable at this time because mask will not fit and proper seal is not possible.

## 2017-05-30 NOTE — Progress Notes (Signed)
Nutrition Follow-up  DOCUMENTATION CODES:   Underweight  INTERVENTION:  - Continue current TF regimen for at least the next 24 hours. - Will order for pt to be weighed today as she has not been weighed since admission.  - When medically feasible, recommend 6 ounces of Jevity 1.5 QID with 2 ounces free water with each bolus. This will provide 710 mL Jevity 1.5 which will provide 1063 kcal, 45 grams of protein, and 778 mL free water.   NUTRITION DIAGNOSIS:   Increased nutrient needs related to chronic illness(CP) as evidenced by estimated needs. -ongoing  GOAL:   Patient will meet greater than or equal to 90% of their needs -unmet with current TF regimen.   MONITOR:   TF tolerance, Weight trends, Labs, I & O's  ASSESSMENT:   Pt with PMH of CP, s/p PEG on home TF, seizure disorder, osteopenia, and scoliosis presents with aspiration pneumonia found hemoptysis  11/8 Pt is non-verbal and non-ambulatory at baseline. Spoke with two caregivers at bedside (they report mom is at work and will not be in until later this afternoon). Caretakers reiterate information outlined below concerning home TF regimen. Report that pt had a very good BM yesterday and again this AM. When asked if pt communicates discomfort with feedings in a non-verbal way here or at home, they report that pt "sometimes grabs her tube out of the blue as we are trying to feed her," some increase in agitation and moaning at times of feedings, or attempts to push away the hand of caregiver as TF is being administered or about to be administered. They deny vomiting being an issue now or PTA.   Re-estimated kcal and protein need given PMH, non-ambulatory status, no skin issues. Pt is currently receiving 3 ounces of Jevity 1.5 QID with 1 ounce flush of free water after each bolus. This is providing 355 mL Jevity 1.5 which provides 532 kcal (60% minimum estimated kcal need), 23 grams of protein (60% minimum estimated protein need), and  388 mL free water. Kcal from TF regimen + IVF is providing at total of 797 kcal (89% minimum estimated kcal need).   Spoke with Dr. Karleen Hampshire and she would like to continue current TF regimen for at least another day and then decide if it will be feasible to increase TF volume to better meet estimated nutrition needs.   Medications reviewed.  Labs reviewed; creatinine: <0.3 mg/dL, Ca: 8.4 mg/dL. IVF: D5-1/2 NS-20 mEq KCl @ 65 mL/hr (265 kcal).   11/5 - Spoke with mother and caretaker at bedside. -  Mom reports pt has been on home TF regimen of 6 ounces Vital 1.5  with 2 ounces free water 4 times/day.  - Pt sometimes tolerates full feeding sometimes needs to reduce amount given.  - Per caretaker, occasionally will try and add a feeding to get rest of ounces in.  - If unable to advance TF volume to home volume, recommended initiation of continuous feedings to promote tolerance.  - Caretaker reports pt has tolerated 4 ounces Jevity 1.5  by 1200 PM today.  - Mom reports pt is down ~ 5 lbs in the past couple months with UBW of 60 lbs.   - Pt's mother reports occasional constipation and utilizing Miralax at baseline.  - Pt likely not receiving enough fluid from current regimen.  Nutrition focused physical exam deferred at this time given pt's agitation, pt wheelchair/bed bound at baseline.      Diet Order:  Diet NPO time  specified  EDUCATION NEEDS:   No education needs have been identified at this time  Skin:  Skin Assessment: Reviewed RN Assessment  Last BM:  11/8 (per caregiver)  Height:   Ht Readings from Last 1 Encounters:  06/16/2017 4' (1.219 m)    Weight:   Wt Readings from Last 1 Encounters:  05/26/2017 55 lb 16 oz (25.4 kg)    Ideal Body Weight:  36 kg  BMI:  Body mass index is 17.09 kg/m.  Estimated Nutritional Needs:   Kcal:  [phone] (35-40 kcal/kg)  Protein:  38-50 grams (1.5-2 grams/kg)  Fluid:  >/= 1.2 L/d    Jarome Matin, MS, RD, LDN,  [facility] Inpatient Clinical Dietitian Pager # [phone] After hours/weekend pager # [phone]

## 2017-05-31 DIAGNOSIS — Z7189 Other specified counseling: Secondary | ICD-10-CM

## 2017-05-31 DIAGNOSIS — G40909 Epilepsy, unspecified, not intractable, without status epilepticus: Secondary | ICD-10-CM

## 2017-05-31 LAB — BASIC METABOLIC PANEL
Anion gap: 4 — ABNORMAL LOW (ref 5–15)
BUN: 5 mg/dL — ABNORMAL LOW (ref 6–20)
CALCIUM: 8.5 mg/dL — AB (ref 8.9–10.3)
CO2: 34 mmol/L — AB (ref 22–32)
Chloride: 104 mmol/L (ref 101–111)
Glucose, Bld: 117 mg/dL — ABNORMAL HIGH (ref 65–99)
Potassium: 4.1 mmol/L (ref 3.5–5.1)
Sodium: 142 mmol/L (ref 135–145)

## 2017-05-31 MED ORDER — JEVITY 1.5 CAL/FIBER PO LIQD
60.0000 mL | ORAL | Status: DC
  Administered 2017-05-31 (×4): 60 mL
  Filled 2017-05-31 (×8): qty 1000

## 2017-05-31 MED ORDER — CHLORHEXIDINE GLUCONATE CLOTH 2 % EX PADS
6.0000 | MEDICATED_PAD | Freq: Every day | CUTANEOUS | Status: DC
Start: 2017-05-31 — End: 2017-06-01
  Administered 2017-05-31: 6 via TOPICAL

## 2017-05-31 MED ORDER — MORPHINE SULFATE 25 MG/ML IV SOLN
1.0000 mg/h | INTRAVENOUS | Status: DC
  Administered 2017-05-31: 1 mg/h via INTRAVENOUS
  Filled 2017-05-31: qty 10

## 2017-05-31 MED ORDER — LORAZEPAM 2 MG/ML IJ SOLN
1.0000 mg | INTRAMUSCULAR | Status: DC | PRN
  Administered 2017-05-31 – 2017-06-01 (×4): 1 mg via INTRAVENOUS
  Filled 2017-05-31 (×4): qty 1

## 2017-05-31 MED ORDER — MORPHINE SULFATE (PF) 4 MG/ML IV SOLN
2.0000 mg | INTRAVENOUS | Status: DC | PRN
  Administered 2017-05-31 (×2): 2 mg via INTRAVENOUS
  Filled 2017-05-31 (×2): qty 1

## 2017-05-31 MED ORDER — GLYCOPYRROLATE 0.2 MG/ML IJ SOLN
0.2000 mg | INTRAMUSCULAR | Status: DC | PRN
  Administered 2017-06-01: 0.2 mg via INTRAVENOUS
  Filled 2017-05-31: qty 1

## 2017-05-31 MED ORDER — SODIUM CHLORIDE 0.9% FLUSH
10.0000 mL | Freq: Two times a day (BID) | INTRAVENOUS | Status: DC
  Administered 2017-05-31: 10 mL

## 2017-05-31 MED ORDER — MORPHINE SULFATE (PF) 4 MG/ML IV SOLN
2.0000 mg | INTRAVENOUS | Status: DC | PRN
  Administered 2017-05-31: 2 mg via INTRAVENOUS
  Filled 2017-05-31: qty 1

## 2017-05-31 MED ORDER — SODIUM CHLORIDE 0.9% FLUSH: 10.0000 mL | INTRAVENOUS | Status: DC | PRN

## 2017-05-31 MED ORDER — MORPHINE BOLUS VIA INFUSION
2.0000 mg | INTRAVENOUS | Status: DC | PRN
  Administered 2017-05-31 – 2017-06-01 (×8): 2 mg via INTRAVENOUS
  Filled 2017-05-31: qty 2

## 2017-05-31 MED ORDER — MORPHINE SULFATE (PF) 4 MG/ML IV SOLN
1.0000 mg | INTRAVENOUS | Status: DC | PRN
  Administered 2017-05-31: 1 mg via INTRAVENOUS
  Filled 2017-05-31: qty 1

## 2017-05-31 NOTE — Progress Notes (Signed)
Name: Renee [name] MRN: [id number] DOB: 07-07-1986    ADMISSION DATE:  05/27/2017 CONSULTATION DATE:  05/28/17  REFERRING MD :  Dr Bonner Puna, TRH  CHIEF COMPLAINT:  Recurrent aspiration PNA  HISTORY OF PRESENT ILLNESS: 31 year old woman with cerebral palsy, kyphoscoliosis, asthma followed in our office.  She was admitted 11/2 with increased work of breathing, increased secretions, blood-tinged sputum on suctioning.  She experienced progressive hypoxemia and was admitted for suspected pneumonia.  Received azithromycin and ceftriaxone, subsequently escalated to cefepime.  A CT scan of her chest showed chronic right lower lobe collapse (compared to prior films by me) and bibasilar mild groundglass.  Over the next 3 days her oxygen needs increased. PCCM consulted to assist.   SUBJECTIVE:  RN reports desaturations overnight.  Flap replaced on NRB this am with desaturations.   VITAL SIGNS: Temp:  [97.7 F (36.5 C)-99.1 F (37.3 C)] 97.7 F (36.5 C) (11/09 0800) Pulse Rate:  [97-119] 107 (11/09 0806) Resp:  [15-24] 22 (11/09 0806) BP: (98-106)/(44-53) 104/46 (11/09 0806) SpO2:  [86 %-100 %] 95 % (11/09 0818) FiO2 (%):  [100 %] 100 % (11/09 0818) Weight:  [57 lb 12.2 oz (26.2 kg)] 57 lb 12.2 oz (26.2 kg) (11/09 0500)  PHYSICAL EXAMINATION: General:  Chronically ill appearing female lying in bed HEENT: MM pink/dry Neuro: opens eyes to voice, moans intermittently, spontaneous movement > attempts to pull mask off face  CV: s1s2 rrr, no m/r/g PULM: even/non-labored, lungs bilaterally coarse rhonchi  GG:YIRS, non-tender, bsx4 active  Extremities: warm/dry, no edema  Skin: no rashes or lesions  Recent Labs  Lab 05/29/17 0341 05/30/17 1347 05/31/17 0450  NA 142 139 142  K 4.5 3.8 4.1  CL 103 100* 104  CO2 31 34* 34*  BUN 6 6 5*  CREATININE <0.30* <0.30* <0.30*  GLUCOSE 99 122* 117*   Recent Labs  Lab 05/28/17 0326 05/29/17 0341 05/30/17 1403  HGB 12.4 12.4 12.0  HCT 38.3 37.8 38.1    WBC 10.2 7.2 11.6*  PLT 213 232 221   Ir Fluoro Guide Cv Line Right  Result Date: 05/30/2017 CLINICAL DATA:  Pneumonia, poor IV access, needs durable IV access for antibiotic regimen. EXAM: RIGHT IJ CENTRAL VENOUS CATHETER PLACEMENT UNDER ULTRASOUND AND FLUOROSCOPIC GUIDANCE FLUOROSCOPY TIME:  0.2 minutes; 9  uGym2 DAP TECHNIQUE: The procedure, risks (including but not limited to bleeding, infection, organ damage ), benefits, and alternatives were explained to the mother. Questions regarding the procedure were encouraged and answered. The mother understands and consents to the procedure. The patient's arms were contracted and could not be positioned appropriately to allow PICC placement. For this reason, IJ access was selected. Patency of the right IJ vein was confirmed with ultrasound with image documentation. An appropriate skin site was determined. Skin site was marked. Region was prepped using maximum barrier technique including cap and mask, sterile gown, sterile gloves, large sterile sheet, and Chlorhexidine as cutaneous antisepsis. The region was infiltrated locally with 1% lidocaine. Under real-time ultrasound guidance, the right IJ vein was accessed with a 21 gauge micropuncture needle; the needle tip within the vein was confirmed with ultrasound image documentation. The needle exchanged over a guidewire for peel-away sheath through which an 14cm 5 [place] PowerPICC dual lumen catheter was advanced. This was positioned with the tip at the cavoatrial junction. Spot chest radiograph shows good positioning and no pneumothorax. Catheter was flushed and sutured externally with 0-Prolene sutures. Patient tolerated the procedure well. COMPLICATIONS: None immediate IMPRESSION: 1. Technically successful  right IJ double lumen power injectable central venous catheter placement. Electronically Signed   By: Lucrezia Europe M.D.   On: 05/30/2017 12:29   Ir US Guide Vasc Access Right  Result Date: 05/30/2017 CLINICAL  DATA:  Pneumonia, poor IV access, needs durable IV access for antibiotic regimen. EXAM: RIGHT IJ CENTRAL VENOUS CATHETER PLACEMENT UNDER ULTRASOUND AND FLUOROSCOPIC GUIDANCE FLUOROSCOPY TIME:  0.2 minutes; 9  uGym2 DAP TECHNIQUE: The procedure, risks (including but not limited to bleeding, infection, organ damage ), benefits, and alternatives were explained to the mother. Questions regarding the procedure were encouraged and answered. The mother understands and consents to the procedure. The patient's arms were contracted and could not be positioned appropriately to allow PICC placement. For this reason, IJ access was selected. Patency of the right IJ vein was confirmed with ultrasound with image documentation. An appropriate skin site was determined. Skin site was marked. Region was prepped using maximum barrier technique including cap and mask, sterile gown, sterile gloves, large sterile sheet, and Chlorhexidine as cutaneous antisepsis. The region was infiltrated locally with 1% lidocaine. Under real-time ultrasound guidance, the right IJ vein was accessed with a 21 gauge micropuncture needle; the needle tip within the vein was confirmed with ultrasound image documentation. The needle exchanged over a guidewire for peel-away sheath through which an 14cm 5 [place] PowerPICC dual lumen catheter was advanced. This was positioned with the tip at the cavoatrial junction. Spot chest radiograph shows good positioning and no pneumothorax. Catheter was flushed and sutured externally with 0-Prolene sutures. Patient tolerated the procedure well. COMPLICATIONS: None immediate IMPRESSION: 1. Technically successful right IJ double lumen power injectable central venous catheter placement. Electronically Signed   By: Lucrezia Europe M.D.   On: 05/30/2017 12:29    ASSESSMENT / PLAN: Cerebral palsy with congenital quadriplegia Acute on chronic hypoxemic respiratory failure due to bronchitis versus healthcare associated  pneumonia History of mild persistent asthma without an acute exacerbation Hemoptysis due to the above plus possible component of suctioning, appears to be resolving Chronic right lower lobe collapse by serial CT scans, due to scoliosis and body habitus  RECS:  Continue bronchodilators - Pulmicort + Xopenex Guaifenesin as able  Unable to do chest PT  O2 for comfort  Liberalize morphine / ativan for work of breathing    Global:  Mother at bedside.  She is tearful this am.  She states she knows Darria is suffering and wants her comfort to be the first priority.  She recognizes that Melba has not made progress with her O2 needs / PNA & points out that her work of breathing appears uncomfortable.  I agree with her assessment.  We discussed liberalizing her morphine/ativan for work of breathing but that this will likely come side effects of sedation and further impaired breathing. She understands and wants to proceed with shifting care toward comfort.  We also discussed transitioning to [place] O2 for comfort for Jeane instead of the face mask which seems to bother her.  We did not touch on concepts such as hospital death vs home with hospice.     PCCM will sign off.  Please call back if new needs arise.    Noe Gens, NP-C [facility] Pgr: [phone] or if no answer [phone] 05/31/2017, 10:26 AM

## 2017-05-31 NOTE — Progress Notes (Signed)
Pt became agitated, biting on mask, spo2 decreased 85-88 %.  Ativan given.  Dr. Olevia Bowens notified.  Orders for Morphine received.  Pt appears more relaxed after ativan given.  Spo2 95 %.  Elink also notified and aware.

## 2017-05-31 NOTE — Consult Note (Signed)
Consultation Note Date: 05/31/2017   Patient Name: Renee [name]  DOB: Aug 23, 1985  MRN: [id number]  Age / Sex: 31 y.o., female  PCP: Sharilyn Sites, MD Referring Physician: Hosie Poisson, MD  Reason for Consultation: Establishing goals of care and Terminal Care  HPI/Patient Profile: 31 y.o. female  with past medical history of CP, kyphoscoliosis, asthma, recurrent aspiration pneumonia status post PEG admitted on 06/16/2017 with right lower lobe aspiration pneumonia without clinical improvement despite maximal therapy.   Palliative consulted for assistance with goals and end-of-life care.  Clinical Assessment and Goals of Care: I met today with patient's mother, her mother's boyfriend, her aunt, and a caregiver.   Her mother reports that the doctors have been doing good job explaining things to her and that her main goal moving forward is to make sure that [name] is comfortable.  We discussed clinical course as well as how each intervention that is ordered may or may not affect the goal of comfort for [name] as she approaches the end of life.  We discussed her current care plan as well as possible interventions that may increase her comfort.  These included: 1.  Continued use of morphine as needed for comfort.  We discussed of initiation of a continuous infusion if she continues to demonstrate need for continuous medication based on her as needed dosing. 2.  Use of facemask (which she is currently using) versus nasal cannula versus no supplemental oxygen based upon her needs for comfort 3.  Possibility of discontinuation of tube feeds, antibiotics, and supplemental IV fluids 4.  Robinul as needed for any secretions  SUMMARY OF RECOMMENDATIONS   -Her mother reported that her overall goal is for comfort and we discussed how to continue to work to transition to make sure her care plan is in line with this goal.  Her  mother is very open to conversation but wanted time to process all the things we had discussed. -When I followed up with her later, we reviewed as needed usage of morphine.  Lenetta had used 6 mg in the prior 6 hours and we made a plan to initiate a continuous infusion of morphine at 1 mg/h based upon this usage. -We also discussed that continuation of tube feeds may lead to further aspiration and discomfort.  She therefore was in agreement with discontinuation of tube feeds. -She is also in agreement with changing monitor in the room to comfort screen. -Discussed plan to otherwise continue same care at this time and reevaluate her situation tomorrow.  Her mother is certainly open to continuing to focus care on comfort, however, we opted to make a few changes and reassess situation in order to give them time to see how she responds and also to emotionally cope with fact we are approaching the end of [name] life.  Code Status/Advance Care Planning:  DNR   Symptom Management:   Pain/shortness of breath: Plan for continuous morphine infusion.  I started that is at 1 mg/h based upon her used to the prior 6  hours.  She appeared very comfortable with this dosing.  She also has orders in place for additional bolus dosing as needed.  I would continue to titrate her continuous infusion based upon her as needed usage overnight.  Anxiety: Ativan as needed  Excess secretions: Robinul as needed  Palliative Prophylaxis:   Frequent Pain Assessment  Prognosis:   Hours - Days  Discharge Planning: Anticipated Hospital Death      Primary Diagnoses: Present on Admission: . Acute respiratory failure with hypoxia ([place]) . Aspiration pneumonia ([place]) . Asthma . Congenital quadriplegia ([place]) . Mild persistent asthma . Protein-calorie malnutrition, severe ([place]) . Sepsis due to pneumonia [facility])   I have reviewed the medical record, interviewed the patient and family, and examined the patient. The following  aspects are pertinent.  Past Medical History:  Diagnosis Date  . Asthma    breathing treatment  . Bone spur   . Broken femur ([place]) 12/23/15  . Cerebral palsy ([place])   . DNI (do not intubate)   . DNR (do not resuscitate)   . DNR (do not resuscitate) 04/28/2014  . Hip dysplasia    bilateral  . Hydrocephalus   . Mental retardation   . Osteopenia   . Scoliosis   . Seizure disorder ([place])   . UTI (urinary tract infection)    Social History   Socioeconomic History  . Marital status: Single    Spouse name: None  . Number of children: 0  . Years of education: None  . Highest education level: None  Social Needs  . Financial resource strain: None  . Food insecurity - worry: None  . Food insecurity - inability: None  . Transportation needs - medical: None  . Transportation needs - non-medical: None  Occupational History  . Occupation: disabled    Fish farm manager: UNEMPLOYED  Tobacco Use  . Smoking status: Never Smoker  . Smokeless tobacco: Never Used  Substance and Sexual Activity  . Alcohol use: No  . Drug use: No  . Sexual activity: No    Partners: Female    Birth control/protection: Injection  Other Topics Concern  . None  Social History Narrative   Renee [name] requires total care in all aspects of daily living. She lives with her mother, who receives some help from a home health aide for Renee [name]. Misty enjoys watching TV.   Family History  Problem Relation Age of Onset  . Breast cancer Maternal Grandmother   . Heart disease Maternal Grandfather   . Thyroid disease Maternal Grandfather   . Thyroid disease Maternal Aunt    Scheduled Meds: . arformoterol  15 mcg Nebulization BID  . baclofen  20 mg Per Tube QID  . budesonide  0.25 mg Nebulization BID  . Chlorhexidine Gluconate Cloth  6 each Topical Daily  . guaiFENesin  20 mL Per Tube BID  . levalbuterol  0.63 mg Nebulization TID  . primidone  200 mg Per Tube Q24H  . primidone  250 mg Per Tube BID  . ranitidine  150 mg Per Tube BID  .  [name]  1.5 mg Per Tube TID  . sodium chloride flush  10-40 mL Intracatheter Q12H  . sodium chloride flush  3 mL Intravenous Q12H   Continuous Infusions: . ceFEPime (MAXIPIME) IV Stopped (05/31/17 1209)  . dextrose 5 % and 0.45 % NaCl with KCl 20 mEq/L 65 mL/hr (05/31/17 1605)  . metronidazole 500 mg (05/31/17 1606)  . morphine 1 mg/hr (05/31/17 1739)   PRN Meds:.acetaminophen **OR** acetaminophen, diazepam,  glycopyrrolate, levalbuterol, LORazepam, morphine, polyethylene glycol, sodium chloride flush Medications Prior to Admission:  Prior to Admission medications   Medication Sig Start Date End Date Taking? Authorizing Provider  albuterol (PROVENTIL) (2.5 MG/3ML) 0.083% nebulizer solution USE 1 VIAL IN NEBULIZER EVERY 6 HOURS FOR WHEEZING OR SHORTNESS OF BREATH. 03/15/17  Yes [name], MD  ALPRAZolam Duanne Moron) 0.25 MG tablet Give 1/4 to 1/2 tablet up to twice per day as needed for severe irritability 06/20/15  Yes Rockwell Germany, NP  arformoterol (BROVANA) 15 MCG/2ML NEBU Take 2 mLs (15 mcg total) by nebulization 2 (two) times daily. 09/09/15  Yes [name], MD  azithromycin (ZITHROMAX) 200 MG/5ML suspension Take 6.4 mLs (256 mg total) by mouth daily. 05/23/17  Yes Orlie Dakin, MD  baclofen (LIORESAL) 10 MG tablet Give 2 tablets at 7AM, 11AM, 3PM and 10PM. May give additional 1 tablet at [facility] if needed 01/11/17  Yes Goodpasture, Otila Kluver, NP  budesonide (PULMICORT) 0.25 MG/2ML nebulizer solution Take 2 mLs (0.25 mg total) by nebulization 2 (two) times daily. 04/09/17  Yes [name], MD  diazepam (VALIUM) 1 MG/ML solution GIVE 3ML EVERY 8 HOURS AS NEEDED FOR AGITATION. 01/11/17  Yes Rockwell Germany, NP  HYDROcodone-acetaminophen (NORCO) 7.5-325 MG tablet Take 1 tablet by mouth every 6 (six) hours as needed for moderate pain. 11/23/16  Yes Newt Minion, MD  medroxyPROGESTERone (DEPO-PROVERA) 150 MG/ML injection INJECT 1ML INTO THE MUSCLE EVERY 3 MONTHS. 10/22/16  Yes Estill Dooms,  NP  metoCLOPramide (REGLAN) 5 MG/5ML solution Use 2.52m prior to meals.  Can increase to 5102mas needed 04/30/17  Yes Armbruster, StCarlota RaspberryMD  Nutritional Supplements (FEEDING SUPPLEMENT, JEVITY 1.5 CAL/FIBER,) LIQD Place 237 mLs into feeding tube 4 (four) times daily. 1 can at 0700, 1100, 1500, and 2000.   Yes [provider]  polyethylene glycol powder (MIRALAX) powder Take 1 capful every day. Titrate as needed. - via peg tube -  as needed for constipation 04/30/17  Yes Armbruster, StCarlota RaspberryMD  primidone (MYSOLINE) 50 MG tablet TAKE 5 TABLETS EVERY MORNING,4 TABLETS IN THE AFTERNOON, AND 5 TABLETS IN THE EVENING. 01/11/17  Yes GoRockwell GermanyNP  ranitidine (ZANTAC) 150 MG/10ML syrup Place 10 mLs (150 mg total) into feeding tube 2 (two) times daily. 04/30/17  Yes Armbruster, StCarlota RaspberryMD  [name] (RISPERDAL) 0.5 MG tablet TAKE 3 TABS VIA G TUBE AT 7AM, 3 TABS AT 11AM, 3 TABS AT 3PM AND 1 TAB AT BEDTIME. 03/19/17  Yes GoRockwell GermanyNP  tiZANidine (ZANAFLEX) 2 MG tablet Crush and give via G-tube - 2 tablets at 7AM, 1+1/2 tablets at 11AM, 1 tablet at 3PM, 2 tablets at 6pm and 1+1/2 tablets at 10PM. 01/11/17  Yes GoRockwell GermanyNP   Allergies  Allergen Reactions  . Other     Paper and adhesive tape - blisters  . Penicillins Rash    Has patient had a PCN reaction causing immediate rash, facial/tongue/throat swelling, SOB or lightheadedness with hypotension:yes Has patient had a PCN reaction causing severe rash involving mucus membranes or skin necrosis: NO Has patient had a PCN reaction that required hospitalization: NO Has patient had a PCN reaction occurring within the last 10 years: NO If all of the above answers are "NO", then may proceed with Cephalosporin use.   . Latex Rash and Other (See Comments)    blisters   Review of Systems Unable to assess  Physical Exam General: Somnolent, resting comfortably.  Thin, cachectic. HEENT: No bruits, no goiter, no  JVD Heart:  Tachycardic.  No murmur appreciated.   Lungs: Diminished air movement, rhonchi Abdomen: Soft, nontender, nondistended, positive bowel sounds.  Ext: No significant edema, significant muscular wasting and contractures noted Skin: Warm and dry Neuro: Unresponsive during my exam   Vital Signs: BP (!) 89/53   Pulse (!) 105   Temp 98.6 F (37 C) (Axillary)   Resp 18   Ht 4' (1.219 m)   Wt 26.2 kg (57 lb 12.2 oz)   SpO2 96%   BMI 17.63 kg/m  Pain Assessment: PAINAD POSS *See Group Information*: 1-Acceptable,Awake and alert     SpO2: SpO2: 96 % O2 Device:SpO2: 96 % O2 Flow Rate: .O2 Flow Rate (L/min): 15 L/min  IO: Intake/output summary:   Intake/Output Summary (Last 24 hours) at 05/31/2017 1747 Last data filed at 05/31/2017 1700 Gross per 24 hour  Intake 3145.75 ml  Output 3 ml  Net 3142.75 ml    LBM: Last BM Date: 05/30/17 Baseline Weight: Weight: 25.4 kg (55 lb 16 oz) Most recent weight: Weight: 26.2 kg (57 lb 12.2 oz)     Palliative Assessment/Data:   Flowsheet Rows     Most Recent Value  Intake Tab  Referral Department  Hospitalist  Unit at Time of Referral  ICU  Palliative Care Primary Diagnosis  Sepsis/Infectious Disease  Date Notified  05/31/17  Palliative Care Type  New Palliative care  Reason for referral  Clarify Goals of Care, End of Life Care Assistance  Date of Admission  06/07/2017  Date first seen by Palliative Care  05/31/17  # of days Palliative referral response time  0 Day(s)  # of days IP prior to Palliative referral  7  Clinical Assessment  Palliative Performance Scale Score  10%  Pain Max last 24 hours  Not able to report  Pain Min Last 24 hours  Not able to report  Psychosocial & Spiritual Assessment  Palliative Care Outcomes  Patient/Family meeting held?  Yes  Who was at the meeting?  Mother, mother's boyfriend, aunt  Palliative Care Outcomes  Improved pain interventions, Improved non-pain symptom therapy, Provided end of life care  assistance      Time: 1400-1500 and [phone] Time Total: 90 Greater than 50%  of this time was spent counseling and coordinating care related to the above assessment and plan.  Signed by: Micheline Rough, MD   Please contact Palliative Medicine Team phone at [phone] for questions and concerns.  For individual provider: See Shea Evans

## 2017-05-31 NOTE — Progress Notes (Addendum)
PROGRESS NOTE    Renee [name]  SJG:[id number] DOB: 05-Jul-1986 DOA: 06/17/2017 PCP: Sharilyn Sites, MD    Brief Narrative: [name] a 31 y.o.femalewith a history of CP, kyphoscoliosis, asthma,  recurrent aspiration pneumonia s/p PEG presents from home with hemoptysis, was found to have RLL pneumonia/ aspiration pneumonia and was admitted to SDU, currently requiring high flow oxygen.     Assessment & Plan:   Principal Problem:   Aspiration pneumonia ([place]) Active Problems:   Mild persistent asthma   Seizure disorder ([place])   Congenital quadriplegia (HCC)   Protein-calorie malnutrition, severe (HCC)   Sepsis due to pneumonia ([place])   Asthma   Acute respiratory failure with hypoxia (HCC)  Cerebral palsy with congenital quadriplegia:   Resume home meds via g tube if family would like to continue.    Acute on chronic respiratory failure due to recurrent aspiration pneumonitis/ health care associated pneumonia Currently requiring high flow oxygen , unable to wean her off the high flow oxygen. Mother would like to transition to comfort care and symptom management as we could not wean her down.  Pulmonology on board and signed off today.   Blood cultures negative so far.Hemoptysis improved.  Pt not able to tolerate chest PT. She was on broad spectrum antibiotics and nebs, would request palliative care consult for symptom management and have goals of care meeting to discuss regarding the continuation of antibiotics , nutrition.   Sepsis from recurrently aspiration pneumonia of RLL:  See above.   Severe protein energy malnutrition:  Dietary consult.    Hypokalemia replaced  Nutrition on Jevity.   Tachycardia:  From increased work of breathing.    DVT prophylaxis: scd's Code Status: DNR Family Communication:mother at  at bedside.  Disposition Plan:  Pending palliative care consult    Consultants:   PCCM.   Procedures: (NONE.   Antimicrobials: since admission, pt been  on flagyl and cefepime.    Subjective: She is more agitated, moaning, trying to take the face mask off.   Objective: Vitals:   05/31/17 0800 05/31/17 0806 05/31/17 0818 05/31/17 1200  BP:  (!) 104/46    Pulse:  (!) 107    Resp:  (!) 22    Temp: 97.7 F (36.5 C)   99 F (37.2 C)  TempSrc: Axillary   Axillary  SpO2:  97% 95%   Weight:      Height:        Intake/Output Summary (Last 24 hours) at 05/31/2017 1312 Last data filed at 05/31/2017 0900 Gross per 24 hour  Intake 2340.75 ml  Output -  Net 2340.75 ml   Filed Weights   05/30/2017 2200 05/31/17 0500  Weight: 25.4 kg (55 lb 16 oz) 26.2 kg (57 lb 12.2 oz)    Examination:  General exam: frail, ill looking ,agitated .  Respiratory system: scattered rhonchi bilateral. Air diminished at bases.  Cardiovascular system: S1 & S2 heard tachycardic, no pedal edema no JVD. Gastrointestinal system: Abdomen is soft non distended  g tube in place.  Central nervous system: agitated. Trying to take the mask off.  Extremities: no edema or cyanosis.  Skin: No rashes, lesions or ulcers Psychiatry: cant be assessed.     Data Reviewed: I have personally reviewed following labs and imaging studies  CBC: Recent Labs  Lab 05/27/2017 1446  05/26/17 0907 05/27/17 0400 05/28/17 0326 05/29/17 0341 05/30/17 1403  WBC 9.4   < > 9.1 6.0 10.2 7.2 11.6*  NEUTROABS 6.4  --   --   --   --   --   --  HGB 15.6*   < > 13.7 13.2 12.4 12.4 12.0  HCT 46.5*   < > 42.9 40.0 38.3 37.8 38.1  MCV 102.0*   < > 105.9* 104.7* 103.5* 104.7* 105.0*  PLT 306   < > 237 237 213 232 221   < > = values in this interval not displayed.   Basic Metabolic Panel: Recent Labs  Lab 05/27/17 0400 05/28/17 0326 05/29/17 0341 05/30/17 1347 05/31/17 0450  NA 142 141 142 139 142  K 4.1 4.0 4.5 3.8 4.1  CL 104 102 103 100* 104  CO2 31 32 31 34* 34*  GLUCOSE 124* 112* 99 122* 117*  BUN <5* 5* 6 6 5*  CREATININE <0.30* <0.30* <0.30* <0.30* <0.30*  CALCIUM 8.3*  8.3* 8.4* 8.2* 8.5*  MG 1.8  --   --   --   --    GFR: CrCl cannot be calculated (This lab value cannot be used to calculate CrCl because it is not a number: <0.30). Liver Function Tests: No results for input(s): AST, ALT, ALKPHOS, BILITOT, PROT, ALBUMIN in the last 168 hours. No results for input(s): LIPASE, AMYLASE in the last 168 hours. No results for input(s): AMMONIA in the last 168 hours. Coagulation Profile: Recent Labs  Lab 06/20/2017 2220  INR 1.13   Cardiac Enzymes: No results for input(s): CKTOTAL, CKMB, CKMBINDEX, TROPONINI in the last 168 hours. BNP (last 3 results) No results for input(s): PROBNP in the last 8760 hours. HbA1C: No results for input(s): HGBA1C in the last 72 hours. CBG: No results for input(s): GLUCAP in the last 168 hours. Lipid Profile: No results for input(s): CHOL, HDL, LDLCALC, TRIG, CHOLHDL, LDLDIRECT in the last 72 hours. Thyroid Function Tests: No results for input(s): TSH, T4TOTAL, FREET4, T3FREE, THYROIDAB in the last 72 hours. Anemia Panel: No results for input(s): VITAMINB12, FOLATE, FERRITIN, TIBC, IRON, RETICCTPCT in the last 72 hours. Sepsis Labs: No results for input(s): PROCALCITON, LATICACIDVEN in the last 168 hours.  Recent Results (from the past 240 hour(s))  Culture, blood (Routine X 2) w Reflex to ID Panel     Status: None   Collection Time: 05/23/17  2:40 PM  Result Value Ref Range Status   Specimen Description BLOOD LEFT HAND  Final   Special Requests   Final    BOTTLES DRAWN AEROBIC AND ANAEROBIC Blood Culture results may not be optimal due to an inadequate volume of blood received in culture bottles   Culture NO GROWTH 5 DAYS  Final   Report Status 05/28/2017 FINAL  Final  Culture, blood (Routine X 2) w Reflex to ID Panel     Status: None   Collection Time: 05/23/17  2:40 PM  Result Value Ref Range Status   Specimen Description BLOOD LEFT HAND  Final   Special Requests   Final    BOTTLES DRAWN AEROBIC AND ANAEROBIC  Blood Culture adequate volume   Culture NO GROWTH 5 DAYS  Final   Report Status 05/28/2017 FINAL  Final  MRSA PCR Screening     Status: None   Collection Time: 06/20/2017  9:55 PM  Result Value Ref Range Status   MRSA by PCR NEGATIVE NEGATIVE Final    Comment:        The GeneXpert MRSA Assay (FDA approved for NASAL specimens only), is one component of a comprehensive MRSA colonization surveillance program. It is not intended to diagnose MRSA infection nor to guide or monitor treatment for MRSA infections.   Blood culture (routine x  2)     Status: None   Collection Time: 05/26/2017 10:20 PM  Result Value Ref Range Status   Specimen Description BLOOD RIGHT ARM  Final   Special Requests IN PEDIATRIC BOTTLE Blood Culture adequate volume  Final   Culture   Final    NO GROWTH 5 DAYS Performed at [facility], [place] [address], [place], [place] [address]    Report Status 05/30/2017 FINAL  Final  Blood culture (routine x 2)     Status: None   Collection Time: 05/26/2017 10:20 PM  Result Value Ref Range Status   Specimen Description BLOOD LEFT HAND  Final   Special Requests IN PEDIATRIC BOTTLE Blood Culture adequate volume  Final   Culture   Final    NO GROWTH 5 DAYS Performed at [facility], [place] [address], [place], [place] [address]    Report Status 05/30/2017 FINAL  Final         Radiology Studies: Ir Fluoro Guide Cv Line Right  Result Date: 05/30/2017 CLINICAL DATA:  Pneumonia, poor IV access, needs durable IV access for antibiotic regimen. EXAM: RIGHT IJ CENTRAL VENOUS CATHETER PLACEMENT UNDER ULTRASOUND AND FLUOROSCOPIC GUIDANCE FLUOROSCOPY TIME:  0.2 minutes; 9  uGym2 DAP TECHNIQUE: The procedure, risks (including but not limited to bleeding, infection, organ damage ), benefits, and alternatives were explained to the mother. Questions regarding the procedure were encouraged and answered. The mother understands and consents to the procedure. The patient's arms were  contracted and could not be positioned appropriately to allow PICC placement. For this reason, IJ access was selected. Patency of the right IJ vein was confirmed with ultrasound with image documentation. An appropriate skin site was determined. Skin site was marked. Region was prepped using maximum barrier technique including cap and mask, sterile gown, sterile gloves, large sterile sheet, and Chlorhexidine as cutaneous antisepsis. The region was infiltrated locally with 1% lidocaine. Under real-time ultrasound guidance, the right IJ vein was accessed with a 21 gauge micropuncture needle; the needle tip within the vein was confirmed with ultrasound image documentation. The needle exchanged over a guidewire for peel-away sheath through which an 14cm 5 [place] PowerPICC dual lumen catheter was advanced. This was positioned with the tip at the cavoatrial junction. Spot chest radiograph shows good positioning and no pneumothorax. Catheter was flushed and sutured externally with 0-Prolene sutures. Patient tolerated the procedure well. COMPLICATIONS: None immediate IMPRESSION: 1. Technically successful right IJ double lumen power injectable central venous catheter placement. Electronically Signed   By: Lucrezia Europe M.D.   On: 05/30/2017 12:29   Ir US Guide Vasc Access Right  Result Date: 05/30/2017 CLINICAL DATA:  Pneumonia, poor IV access, needs durable IV access for antibiotic regimen. EXAM: RIGHT IJ CENTRAL VENOUS CATHETER PLACEMENT UNDER ULTRASOUND AND FLUOROSCOPIC GUIDANCE FLUOROSCOPY TIME:  0.2 minutes; 9  uGym2 DAP TECHNIQUE: The procedure, risks (including but not limited to bleeding, infection, organ damage ), benefits, and alternatives were explained to the mother. Questions regarding the procedure were encouraged and answered. The mother understands and consents to the procedure. The patient's arms were contracted and could not be positioned appropriately to allow PICC placement. For this reason, IJ access was  selected. Patency of the right IJ vein was confirmed with ultrasound with image documentation. An appropriate skin site was determined. Skin site was marked. Region was prepped using maximum barrier technique including cap and mask, sterile gown, sterile gloves, large sterile sheet, and Chlorhexidine as cutaneous antisepsis. The region was infiltrated  locally with 1% lidocaine. Under real-time ultrasound guidance, the right IJ vein was accessed with a 21 gauge micropuncture needle; the needle tip within the vein was confirmed with ultrasound image documentation. The needle exchanged over a guidewire for peel-away sheath through which an 14cm 5 [place] PowerPICC dual lumen catheter was advanced. This was positioned with the tip at the cavoatrial junction. Spot chest radiograph shows good positioning and no pneumothorax. Catheter was flushed and sutured externally with 0-Prolene sutures. Patient tolerated the procedure well. COMPLICATIONS: None immediate IMPRESSION: 1. Technically successful right IJ double lumen power injectable central venous catheter placement. Electronically Signed   By: Lucrezia Europe M.D.   On: 05/30/2017 12:29        Scheduled Meds: . arformoterol  15 mcg Nebulization BID  . baclofen  20 mg Per Tube QID  . budesonide  0.25 mg Nebulization BID  . Chlorhexidine Gluconate Cloth  6 each Topical Daily  . feeding supplement (JEVITY 1.5 CAL/FIBER)  60 mL Per Tube Q2H  . guaiFENesin  20 mL Per Tube BID  . levalbuterol  0.63 mg Nebulization TID  . primidone  200 mg Per Tube Q24H  . primidone  250 mg Per Tube BID  . ranitidine  150 mg Per Tube BID  . [name]  1.5 mg Per Tube TID  . sodium chloride flush  10-40 mL Intracatheter Q12H  . sodium chloride flush  3 mL Intravenous Q12H   Continuous Infusions: . ceFEPime (MAXIPIME) IV Stopped (05/31/17 1209)  . dextrose 5 % and 0.45 % NaCl with KCl 20 mEq/L 65 mL/hr at 05/31/17 0600  . metronidazole 500 mg (05/31/17 0826)     LOS: 7  days    Time spent:  35 minutes.     Hosie Poisson, MD [facility] Pager [phone]   If 7PM-7AM, please contact night-coverage [email or web address] Password [email or web address] 05/31/2017, 1:12 PM

## 2017-06-01 DIAGNOSIS — Z515 Encounter for palliative care: Secondary | ICD-10-CM

## 2017-06-01 DIAGNOSIS — R0600 Dyspnea, unspecified: Secondary | ICD-10-CM

## 2017-06-01 MED ORDER — ORAL CARE MOUTH RINSE: 15.0000 mL | Freq: Two times a day (BID) | OROMUCOSAL | Status: DC

## 2017-06-01 MED ORDER — SODIUM CHLORIDE 0.9 % IV SOLN: INTRAVENOUS | Status: DC

## 2017-06-01 MED ORDER — CHLORHEXIDINE GLUCONATE 0.12 % MT SOLN: 15.0000 mL | Freq: Two times a day (BID) | OROMUCOSAL | Status: DC

## 2017-06-01 MED ORDER — SCOPOLAMINE 1 MG/3DAYS TD PT72
1.0000 | MEDICATED_PATCH | TRANSDERMAL | Status: DC
  Administered 2017-06-01: 1.5 mg via TRANSDERMAL
  Filled 2017-06-01: qty 1

## 2017-06-22 NOTE — Discharge Summary (Signed)
Death Summary  Renee [name] ZOX:[id number] DOB: 1986/01/18 DOA: 06/21/17  PCP: Renee Sites, MD  Admit date: 06/21/17 Date of Death: 06-29-17 Time of Death: 16 37 Notification: Renee Sites, MD notified of death of 01-Jul-2017   History of present illness:  Renee [name] a 31 y.o.femalewith a history of CP, kyphoscoliosis, asthma,  recurrent aspiration pneumonia s/p PEG presents from home with hemoptysis, was found to have RLL pneumonia/ aspiration pneumonia and was admitted to SDU, currently requiring high flow oxygen. She had deteriorated clinically, unable to clear her secretions and unable to wean her off the high flow oxygen. She became more agitated, started having some seizure like activity . Mother at bedside, did not want her daughter to suffer and wanted to transition to comfort care at this point and solely focus on comfort for her.  Tube feeds have been stopped, antibiotics and all meds not contributing to her comfort have been stopped. She was started on morphine drip,A scopolamine patch was also added for comfort and she passed away at 16:37 pm .   Final Diagnoses:  1.   Aspiration pneumonia (HCC)   Mild persistent asthma   Seizure disorder (HCC)   Congenital quadriplegia (HCC)   Protein-calorie malnutrition, severe (HCC)   Sepsis due to pneumonia ([place])   Asthma   Acute respiratory failure with hypoxia ([place])  Cerebral palsy with congenital quadriplegia:   stopped all meds at this point as she is actively dying and focus on comfort.    Acute on chronic respiratory failure due to recurrent aspiration pneumonitis/ health care associated pneumonia Currently requiring high flow oxygen , unable to wean her off the high flow oxygen. Mother dicussed with palliative care and PCCM and transitioned her to comfort care.  she was started on morphine gtt and pt passed away at 16:37.    Sepsis from recurrently aspiration pneumonia of RLL:  See above.   Severe protein  energy malnutrition:     Hypokalemia replaced  Nutrition tube feeds stopped.   Tachycardia:  From increased work of breathing     The results of significant diagnostics from this hospitalization (including imaging, microbiology, ancillary and laboratory) are listed below for reference.    Significant Diagnostic Studies: Dg Chest 2 View  Result Date: 06/21/17 CLINICAL DATA:  31 year old female with hemoptysis. Markedly limited evaluation due to positioning, the best possible images were obtained. EXAM: CHEST  2 VIEW COMPARISON:  05/23/2017 and priors FINDINGS: There is severe, chronic thoracolumbar scoliosis with thoracic hardware intact. The cardiomediastinal appearance is stable. Confluent opacity in the right mid lung is again identified and appear similar to most recent comparison examination. The appearance of the left lung is also grossly unchanged. No sizable pleural effusions are definite pneumothorax. Stable visualized osseous appearance. Markedly gaseous distension of bowel loops again partially visualized. IMPRESSION: Grossly unchanged appearance of the chest from most recent comparison examination. PE focal opacity in the right mid lung is again noted. Electronically Signed   By: Kristopher Oppenheim M.D.   On: 06-21-17 15:59   Ct Chest W Contrast  Result Date: 06-21-17 CLINICAL DATA:  31 year old female with hemoptysis. History of cerebral palsy and recent diagnosis of pneumonia. EXAM: CT CHEST WITH CONTRAST TECHNIQUE: Multidetector CT imaging of the chest was performed during intravenous contrast administration. CONTRAST:  2mL ISOVUE-300 IOPAMIDOL (ISOVUE-300) INJECTION 61% COMPARISON:  Chest radiograph dated Jun 21, 2017 FINDINGS: Evaluation is limited due to streak artifact caused by spinal Harrington rods. Cardiovascular: There is no cardiomegaly or pericardial effusion. The thoracic aorta  is unremarkable. The origins of the great vessels of the aortic arch appear patent.  There is mild dilatation of the main pulmonary trunk suggestive of underlying pulmonary hypertension. Evaluation of the pulmonary arteries is very limited due to streak artifact and suboptimal opacification of the distal branches. No large central pulmonary artery embolus identified in the pulmonary trunk or main pulmonary arteries. Mediastinum/Nodes: Evaluation of the hila is limited due to consolidative changes of the adjacent lungs. No large mediastinal lymph nodes. The esophagus is slightly thickened which may be reactive to recurrent reflux. No large mediastinal fluid collection. Lungs/Pleura: There is consolidative changes of the superior segment of the right lower lobe. There is diffuse nodular and ground-glass density in the lower lobes bilaterally. Prominent patchy density in the left hilum may represent atelectasis versus infiltration or enlarged lymph node. There is no pleural effusion or pneumothorax. Debris noted in the upper trachea concerning for aspiration. Upper Abdomen: The liver is enlarged. The visualized upper abdomen is grossly unremarkable. Musculoskeletal: Scoliosis with posterior spinal Harrington rods. The bones are osteopenic. No acute osseous pathology. A VP shunt is partially visualized along the right anterior chest wall. IMPRESSION: 1. Very limited, almost nondiagnostic study for pulmonary embolism. No definite large central pulmonary artery embolus identified. 2. Consolidative changes of the superior segment of the right lower lobe as well as bilateral lower lobe ground-glass nodular densities most consistent with pneumonia. Findings may be related to aspiration. Clinical correlation is recommended. 3. The pre in the upper trachea may represent mucus secretion but is concerning for aspiration. 4. Scoliosis. Electronically Signed   By: Anner Crete M.D.   On: 05/31/2017 19:51   Ir Fluoro Guide Cv Line Right  Result Date: 05/30/2017 CLINICAL DATA:  Pneumonia, poor IV access,  needs durable IV access for antibiotic regimen. EXAM: RIGHT IJ CENTRAL VENOUS CATHETER PLACEMENT UNDER ULTRASOUND AND FLUOROSCOPIC GUIDANCE FLUOROSCOPY TIME:  0.2 minutes; 9  uGym2 DAP TECHNIQUE: The procedure, risks (including but not limited to bleeding, infection, organ damage ), benefits, and alternatives were explained to the mother. Questions regarding the procedure were encouraged and answered. The mother understands and consents to the procedure. The patient's arms were contracted and could not be positioned appropriately to allow PICC placement. For this reason, IJ access was selected. Patency of the right IJ vein was confirmed with ultrasound with image documentation. An appropriate skin site was determined. Skin site was marked. Region was prepped using maximum barrier technique including cap and mask, sterile gown, sterile gloves, large sterile sheet, and Chlorhexidine as cutaneous antisepsis. The region was infiltrated locally with 1% lidocaine. Under real-time ultrasound guidance, the right IJ vein was accessed with a 21 gauge micropuncture needle; the needle tip within the vein was confirmed with ultrasound image documentation. The needle exchanged over a guidewire for peel-away sheath through which an 14cm 5 [place] PowerPICC dual lumen catheter was advanced. This was positioned with the tip at the cavoatrial junction. Spot chest radiograph shows good positioning and no pneumothorax. Catheter was flushed and sutured externally with 0-Prolene sutures. Patient tolerated the procedure well. COMPLICATIONS: None immediate IMPRESSION: 1. Technically successful right IJ double lumen power injectable central venous catheter placement. Electronically Signed   By: Lucrezia Europe M.D.   On: 05/30/2017 12:29   Ir US Guide Vasc Access Right  Result Date: 05/30/2017 CLINICAL DATA:  Pneumonia, poor IV access, needs durable IV access for antibiotic regimen. EXAM: RIGHT IJ CENTRAL VENOUS CATHETER PLACEMENT UNDER  ULTRASOUND AND FLUOROSCOPIC GUIDANCE FLUOROSCOPY TIME:  0.2 minutes; 9  uGym2 DAP TECHNIQUE: The procedure, risks (including but not limited to bleeding, infection, organ damage ), benefits, and alternatives were explained to the mother. Questions regarding the procedure were encouraged and answered. The mother understands and consents to the procedure. The patient's arms were contracted and could not be positioned appropriately to allow PICC placement. For this reason, IJ access was selected. Patency of the right IJ vein was confirmed with ultrasound with image documentation. An appropriate skin site was determined. Skin site was marked. Region was prepped using maximum barrier technique including cap and mask, sterile gown, sterile gloves, large sterile sheet, and Chlorhexidine as cutaneous antisepsis. The region was infiltrated locally with 1% lidocaine. Under real-time ultrasound guidance, the right IJ vein was accessed with a 21 gauge micropuncture needle; the needle tip within the vein was confirmed with ultrasound image documentation. The needle exchanged over a guidewire for peel-away sheath through which an 14cm 5 [place] PowerPICC dual lumen catheter was advanced. This was positioned with the tip at the cavoatrial junction. Spot chest radiograph shows good positioning and no pneumothorax. Catheter was flushed and sutured externally with 0-Prolene sutures. Patient tolerated the procedure well. COMPLICATIONS: None immediate IMPRESSION: 1. Technically successful right IJ double lumen power injectable central venous catheter placement. Electronically Signed   By: Lucrezia Europe M.D.   On: 05/30/2017 12:29   Dg Chest Port 1 View  Result Date: 05/23/2017 CLINICAL DATA:  Productive cough and bloody sputum or vomiting is during suctioning this morning. History of infantile cerebral palsy, asthma. EXAM: PORTABLE CHEST 1 VIEW COMPARISON:  Chest x-ray of August 06, 2016 FINDINGS: The lungs are adequately inflated.  There is patchy increased density in the right perihilar region which is new. The interstitial markings within the left lung are coarse but stable. The heart is not enlarged. The pulmonary vascularity is not engorged. Severe dextroscoliosis is stable. A ventriculo atrial shunt tube is present but cannot be follow up below approximately T4. IMPRESSION: Subtle increased density in the right perihilar region may reflect developing pneumonia or an area of atelectasis. The examination is otherwise stable. The ventricular shunt tube cannot be followed on this study below the upper thorax. Electronically Signed   By: David  Martinique M.D.   On: 05/23/2017 10:54    Microbiology: Recent Results (from the past 240 hour(s))  MRSA PCR Screening     Status: None   Collection Time: 06/10/2017  9:55 PM  Result Value Ref Range Status   MRSA by PCR NEGATIVE NEGATIVE Final    Comment:        The GeneXpert MRSA Assay (FDA approved for NASAL specimens only), is one component of a comprehensive MRSA colonization surveillance program. It is not intended to diagnose MRSA infection nor to guide or monitor treatment for MRSA infections.   Blood culture (routine x 2)     Status: None   Collection Time: 06/09/2017 10:20 PM  Result Value Ref Range Status   Specimen Description BLOOD RIGHT ARM  Final   Special Requests IN PEDIATRIC BOTTLE Blood Culture adequate volume  Final   Culture   Final    NO GROWTH 5 DAYS Performed at [facility], [place] [address], [place], [place] [address]    Report Status 05/30/2017 FINAL  Final  Blood culture (routine x 2)     Status: None   Collection Time: 06/02/2017 10:20 PM  Result Value Ref Range Status   Specimen Description BLOOD LEFT HAND  Final  Special Requests IN PEDIATRIC BOTTLE Blood Culture adequate volume  Final   Culture   Final    NO GROWTH 5 DAYS Performed at [facility], [place] [address], [place], [place] [address]    Report Status 05/30/2017 FINAL  Final      Labs: Basic Metabolic Panel: Recent Labs  Lab 05/28/17 0326 05/29/17 0341 05/30/17 1347 05/31/17 0450  NA 141 142 139 142  K 4.0 4.5 3.8 4.1  CL 102 103 100* 104  CO2 32 31 34* 34*  GLUCOSE 112* 99 122* 117*  BUN 5* 6 6 5*  CREATININE <0.30* <0.30* <0.30* <0.30*  CALCIUM 8.3* 8.4* 8.2* 8.5*   Liver Function Tests: No results for input(s): AST, ALT, ALKPHOS, BILITOT, PROT, ALBUMIN in the last 168 hours. No results for input(s): LIPASE, AMYLASE in the last 168 hours. No results for input(s): AMMONIA in the last 168 hours. CBC: Recent Labs  Lab 05/28/17 0326 05/29/17 0341 05/30/17 1403  WBC 10.2 7.2 11.6*  HGB 12.4 12.4 12.0  HCT 38.3 37.8 38.1  MCV 103.5* 104.7* 105.0*  PLT 213 232 221   Cardiac Enzymes: No results for input(s): CKTOTAL, CKMB, CKMBINDEX, TROPONINI in the last 168 hours. D-Dimer No results for input(s): DDIMER in the last 72 hours. BNP: Invalid input(s): POCBNP CBG: No results for input(s): GLUCAP in the last 168 hours. Anemia work up No results for input(s): VITAMINB12, FOLATE, FERRITIN, TIBC, IRON, RETICCTPCT in the last 72 hours. Urinalysis    Component Value Date/Time   COLORURINE AMBER (A) 08/06/2016 1406   APPEARANCEUR CLOUDY (A) 08/06/2016 1406   LABSPEC 1.014 08/06/2016 1406   PHURINE 7.0 08/06/2016 1406   GLUCOSEU NEGATIVE 08/06/2016 1406   HGBUR SMALL (A) 08/06/2016 1406   BILIRUBINUR NEGATIVE 08/06/2016 1406   KETONESUR NEGATIVE 08/06/2016 1406   PROTEINUR 30 (A) 08/06/2016 1406   UROBILINOGEN 1.0 04/26/2014 1529   NITRITE NEGATIVE 08/06/2016 1406   LEUKOCYTESUR LARGE (A) 08/06/2016 1406   Sepsis Labs Invalid input(s): PROCALCITONIN,  WBC,  LACTICIDVEN     SIGNEDHosie Poisson, MD  [facility] 06/03/2017, 8:39 AM Pager   If 7PM-7AM, please contact night-coverage [email or web address] Password [email or web address]

## 2017-06-22 NOTE — Progress Notes (Addendum)
Patient passed away at 1637. Family at bedside. Confirmation done with Lacinda Axon, RN. Dr. Karleen Hampshire and Dr. Domingo Cocking made aware. Dr. Karleen Hampshire to come and sign certificate. Death checklist completed. Patient taken down to [facility] by RN and NT at 1815.

## 2017-06-22 NOTE — Progress Notes (Signed)
Patient having difficulty managing her oral secretion, requiring frequent suctioning. Suctioning causes her agitation.Floor coverage hospitalist made aware, order for a Scopolamine patch received.

## 2017-06-22 NOTE — Progress Notes (Signed)
PROGRESS NOTE    Renee [name]  ZOX:[id number] DOB: 1985-11-03 DOA: 06/10/2017 PCP: Renee Sites, MD    Brief Narrative: Renee [name] a 31 y.o.femalewith a history of CP, kyphoscoliosis, asthma,  recurrent aspiration pneumonia s/p PEG presents from home with hemoptysis, was found to have RLL pneumonia/ aspiration pneumonia and was admitted to SDU, currently requiring high flow oxygen. She had deteriorated clinically, unable to clear her secretions and unable to wean her off the high flow oxygen. He became more agitated, started having some seizure like activity . Mother at bedside, did not want her daughter to suffer and wanted to transition to comfort care at this point and solely focus on comfort for her.  Tube feeds have been stopped, antibiotics and all meds not contributing to her comfort have been stopped. She was started on morphine drip and she appears much more comfortable this am. A scopolamine patch was also added.     Assessment & Plan:   Principal Problem:   Aspiration pneumonia ([place]) Active Problems:   Mild persistent asthma   Seizure disorder ([place])   Congenital quadriplegia (HCC)   Protein-calorie malnutrition, severe (HCC)   Sepsis due to pneumonia ([place])   Asthma   Acute respiratory failure with hypoxia ([place])  Cerebral palsy with congenital quadriplegia:   stopped all meds at this point as she is actively dying and focus on comfort.    Acute on chronic respiratory failure due to recurrent aspiration pneumonitis/ health care associated pneumonia Currently requiring high flow oxygen , unable to wean her off the high flow oxygen. Mother dicussed with palliative care and PCCM and transitioned her to comfort care.    Blood cultures negative so far.   Sepsis from recurrently aspiration pneumonia of RLL:  See above.   Severe protein energy malnutrition:     Hypokalemia replaced  Nutrition tube feeds stopped.   Tachycardia:  From increased work of breathing.  Improved.    DVT prophylaxis: scd's Code Status: DNR Family Communication:mother at  at bedside.  Disposition Plan: comfort care.    Consultants:   PCCM.   Procedures: (NONE.   Antimicrobials: since admission, pt been on flagyl and cefepime.    Subjective: She is more agitated, moaning, trying to take the face mask off.   Objective: Vitals:   06-21-17 0600 06-21-17 0800 06-21-2017 1000 2017/06/21 1100  BP: (!) 100/41 (!) 90/40 (!) 93/40   Pulse: (!) 116 99 (!) 103 (!) 115  Resp: 18 19 (!) 33 19  Temp:      TempSrc:      SpO2: (!) 80% 96% 98% 99%  Weight:      Height:        Intake/Output Summary (Last 24 hours) at June 21, 2017 1540 Last data filed at 21-Jun-2017 1400 Gross per 24 hour  Intake 1130 ml  Output 1 ml  Net 1129 ml   Filed Weights   06/11/2017 2200 05/31/17 0500  Weight: 25.4 kg (55 lb 16 oz) 26.2 kg (57 lb 12.2 oz)    Examination:  General exam: frail, ill looking , comfortable.  Respiratory system: scattered rhonchi bilateral. Air diminished at bases.  Cardiovascular system: S1 & S2 heard tachycardic, no pedal edema no JVD. Gastrointestinal system: Abdomen is soft non distended  g tube in place.  Central nervous system: sedated.  Extremities: no edema or cyanosis.  Skin: No rashes, lesions or ulcers Psychiatry: cant be assessed.     Data Reviewed: I have personally reviewed following labs and imaging studies  CBC: Recent Labs  Lab 05/26/17 0907 05/27/17 0400 05/28/17 0326 05/29/17 0341 05/30/17 1403  WBC 9.1 6.0 10.2 7.2 11.6*  HGB 13.7 13.2 12.4 12.4 12.0  HCT 42.9 40.0 38.3 37.8 38.1  MCV 105.9* 104.7* 103.5* 104.7* 105.0*  PLT 237 237 213 232 662   Basic Metabolic Panel: Recent Labs  Lab 05/27/17 0400 05/28/17 0326 05/29/17 0341 05/30/17 1347 05/31/17 0450  NA 142 141 142 139 142  K 4.1 4.0 4.5 3.8 4.1  CL 104 102 103 100* 104  CO2 31 32 31 34* 34*  GLUCOSE 124* 112* 99 122* 117*  BUN <5* 5* 6 6 5*  CREATININE <0.30*  <0.30* <0.30* <0.30* <0.30*  CALCIUM 8.3* 8.3* 8.4* 8.2* 8.5*  MG 1.8  --   --   --   --    GFR: CrCl cannot be calculated (This lab value cannot be used to calculate CrCl because it is not a number: <0.30). Liver Function Tests: No results for input(s): AST, ALT, ALKPHOS, BILITOT, PROT, ALBUMIN in the last 168 hours. No results for input(s): LIPASE, AMYLASE in the last 168 hours. No results for input(s): AMMONIA in the last 168 hours. Coagulation Profile: No results for input(s): INR, PROTIME in the last 168 hours. Cardiac Enzymes: No results for input(s): CKTOTAL, CKMB, CKMBINDEX, TROPONINI in the last 168 hours. BNP (last 3 results) No results for input(s): PROBNP in the last 8760 hours. HbA1C: No results for input(s): HGBA1C in the last 72 hours. CBG: No results for input(s): GLUCAP in the last 168 hours. Lipid Profile: No results for input(s): CHOL, HDL, LDLCALC, TRIG, CHOLHDL, LDLDIRECT in the last 72 hours. Thyroid Function Tests: No results for input(s): TSH, T4TOTAL, FREET4, T3FREE, THYROIDAB in the last 72 hours. Anemia Panel: No results for input(s): VITAMINB12, FOLATE, FERRITIN, TIBC, IRON, RETICCTPCT in the last 72 hours. Sepsis Labs: No results for input(s): PROCALCITON, LATICACIDVEN in the last 168 hours.  Recent Results (from the past 240 hour(s))  Culture, blood (Routine X 2) w Reflex to ID Panel     Status: None   Collection Time: 05/23/17  2:40 PM  Result Value Ref Range Status   Specimen Description BLOOD LEFT HAND  Final   Special Requests   Final    BOTTLES DRAWN AEROBIC AND ANAEROBIC Blood Culture results may not be optimal due to an inadequate volume of blood received in culture bottles   Culture NO GROWTH 5 DAYS  Final   Report Status 05/28/2017 FINAL  Final  Culture, blood (Routine X 2) w Reflex to ID Panel     Status: None   Collection Time: 05/23/17  2:40 PM  Result Value Ref Range Status   Specimen Description BLOOD LEFT HAND  Final   Special  Requests   Final    BOTTLES DRAWN AEROBIC AND ANAEROBIC Blood Culture adequate volume   Culture NO GROWTH 5 DAYS  Final   Report Status 05/28/2017 FINAL  Final  MRSA PCR Screening     Status: None   Collection Time: 06/05/2017  9:55 PM  Result Value Ref Range Status   MRSA by PCR NEGATIVE NEGATIVE Final    Comment:        The GeneXpert MRSA Assay (FDA approved for NASAL specimens only), is one component of a comprehensive MRSA colonization surveillance program. It is not intended to diagnose MRSA infection nor to guide or monitor treatment for MRSA infections.   Blood culture (routine x 2)     Status: None  Collection Time: 05/27/2017 10:20 PM  Result Value Ref Range Status   Specimen Description BLOOD RIGHT ARM  Final   Special Requests IN PEDIATRIC BOTTLE Blood Culture adequate volume  Final   Culture   Final    NO GROWTH 5 DAYS Performed at [facility], [place] [address], [place], [place] [address]    Report Status 05/30/2017 FINAL  Final  Blood culture (routine x 2)     Status: None   Collection Time: 05/31/2017 10:20 PM  Result Value Ref Range Status   Specimen Description BLOOD LEFT HAND  Final   Special Requests IN PEDIATRIC BOTTLE Blood Culture adequate volume  Final   Culture   Final    NO GROWTH 5 DAYS Performed at [facility], [place] [address] [place], [place] [address]    Report Status 05/30/2017 FINAL  Final         Radiology Studies: No results found.      Scheduled Meds: . chlorhexidine  15 mL Mouth Rinse BID  . mouth rinse  15 mL Mouth Rinse q12n4p  . scopolamine  1 patch Transdermal Q72H  . sodium chloride flush  10-40 mL Intracatheter Q12H  . sodium chloride flush  3 mL Intravenous Q12H   Continuous Infusions: . sodium chloride 10 mL/hr (03-Jun-2017 1015)  . morphine 5 mg/hr (2017/06/03 1400)     LOS: 8 days    Time spent:  35 minutes.     Hosie Poisson, MD [facility] Pager [phone]   If 7PM-7AM, please contact  night-coverage [email or web address] Password [email or web address] 06/03/2017, 3:40 PM

## 2017-06-22 NOTE — Progress Notes (Signed)
Palliative care progress note  Reason for visit: Symptoms related to end-of-life care, dyspnea, psychosocial support, terminal care  Reviewed chart and spoke with bedside RN.  Noted increase in secretions last night with initiation of scopolamine patch with good results.  I saw and examined Renee [name] this morning.  She was resting comfortably on examination with no signs of distress.  Her mother, her aunt, uncle, and her mother's boyfriend were bedside today.  We reviewed clinical course over the last 24 hours and her mother was clear with nursing earlier today that she does not want to continue to administer medications that are not going to assist with her comfort at this point.  I reviewed her overnight as needed usage.  She was using higher doses of morphine throughout the evening but this seems to have tapered off and she is resting comfortably.  We will therefore continue with the same plan today and I will look to increase basal rate if needed later this afternoon.  Her mother and I talked about care plan and she was again clear that her only goal at this point is to ensure that Renee [name] is comfortable.  I therefore discontinued other medications and interventions that are not specifically related to her comfort.  We did leave a facemask in place at this time but I would have a low threshold to transition to nasal cannula or discontinue if needed for her comfort or at the request of family.  We will continue to discuss this with them.  Renee [name] is actively dying.  I anticipate her prognosis at this point to be hours to days.  Do not think that she is stable enough to consider transition from the hospital at this point.   Total time: 40 minutes Greater than 50%  of this time was spent counseling and coordinating care related to the above assessment and plan.  Micheline Rough, MD [place] Team [phone]

## 2017-06-22 DEATH — deceased

## 2017-07-01 ENCOUNTER — Ambulatory Visit: Payer: 59
